# Patient Record
Sex: Male | Born: 1980 | Race: White | Hispanic: No | Marital: Married | Smoking: Current every day smoker
Health system: Southern US, Community
[De-identification: ages and names within clinical notes are randomized; demographics above are authoritative.]

## PROBLEM LIST (undated history)

## (undated) DIAGNOSIS — M549 Dorsalgia, unspecified: Secondary | ICD-10-CM

## (undated) DIAGNOSIS — R569 Unspecified convulsions: Secondary | ICD-10-CM

## (undated) DIAGNOSIS — G8929 Other chronic pain: Secondary | ICD-10-CM

## (undated) HISTORY — PX: CHOLECYSTECTOMY: SHX55

---

## 2002-06-26 ENCOUNTER — Emergency Department (HOSPITAL_COMMUNITY): Admission: EM | Admit: 2002-06-26 | Discharge: 2002-06-26 | Payer: Self-pay | Admitting: *Deleted

## 2003-01-14 ENCOUNTER — Emergency Department (HOSPITAL_COMMUNITY): Admission: AD | Admit: 2003-01-14 | Discharge: 2003-01-15 | Payer: Self-pay | Admitting: Emergency Medicine

## 2003-11-11 ENCOUNTER — Emergency Department (HOSPITAL_COMMUNITY): Admission: EM | Admit: 2003-11-11 | Discharge: 2003-11-11 | Payer: Self-pay | Admitting: Emergency Medicine

## 2004-08-19 ENCOUNTER — Emergency Department (HOSPITAL_COMMUNITY): Admission: EM | Admit: 2004-08-19 | Discharge: 2004-08-19 | Payer: Self-pay | Admitting: Emergency Medicine

## 2004-10-08 ENCOUNTER — Emergency Department (HOSPITAL_COMMUNITY): Admission: EM | Admit: 2004-10-08 | Discharge: 2004-10-09 | Payer: Self-pay | Admitting: Emergency Medicine

## 2004-11-05 ENCOUNTER — Emergency Department (HOSPITAL_COMMUNITY): Admission: EM | Admit: 2004-11-05 | Discharge: 2004-11-05 | Payer: Self-pay | Admitting: Emergency Medicine

## 2004-12-31 ENCOUNTER — Emergency Department (HOSPITAL_COMMUNITY): Admission: EM | Admit: 2004-12-31 | Discharge: 2004-12-31 | Payer: Self-pay | Admitting: Emergency Medicine

## 2005-02-25 ENCOUNTER — Emergency Department (HOSPITAL_COMMUNITY): Admission: EM | Admit: 2005-02-25 | Discharge: 2005-02-25 | Payer: Self-pay | Admitting: Family Medicine

## 2005-05-07 ENCOUNTER — Emergency Department (HOSPITAL_COMMUNITY): Admission: EM | Admit: 2005-05-07 | Discharge: 2005-05-07 | Payer: Self-pay | Admitting: Family Medicine

## 2005-12-07 ENCOUNTER — Emergency Department (HOSPITAL_COMMUNITY): Admission: EM | Admit: 2005-12-07 | Discharge: 2005-12-07 | Payer: Self-pay | Admitting: Emergency Medicine

## 2007-05-23 ENCOUNTER — Emergency Department (HOSPITAL_COMMUNITY): Admission: EM | Admit: 2007-05-23 | Discharge: 2007-05-23 | Payer: Self-pay | Admitting: Family Medicine

## 2008-09-05 ENCOUNTER — Emergency Department (HOSPITAL_COMMUNITY): Admission: EM | Admit: 2008-09-05 | Discharge: 2008-09-05 | Payer: Self-pay | Admitting: Emergency Medicine

## 2008-12-03 ENCOUNTER — Emergency Department (HOSPITAL_COMMUNITY): Admission: EM | Admit: 2008-12-03 | Discharge: 2008-12-03 | Payer: Self-pay | Admitting: Emergency Medicine

## 2009-11-27 ENCOUNTER — Emergency Department (HOSPITAL_COMMUNITY): Admission: EM | Admit: 2009-11-27 | Discharge: 2009-11-27 | Payer: Self-pay | Admitting: Emergency Medicine

## 2010-05-26 ENCOUNTER — Emergency Department (HOSPITAL_COMMUNITY): Payer: Self-pay

## 2010-05-26 ENCOUNTER — Emergency Department (HOSPITAL_COMMUNITY)
Admission: EM | Admit: 2010-05-26 | Discharge: 2010-05-26 | Disposition: A | Discharge status: Home / Self Care | Payer: Self-pay | Attending: Emergency Medicine | Admitting: Emergency Medicine

## 2010-05-26 DIAGNOSIS — F17200 Nicotine dependence, unspecified, uncomplicated: Secondary | ICD-10-CM | POA: Insufficient documentation

## 2010-05-26 DIAGNOSIS — R21 Rash and other nonspecific skin eruption: Secondary | ICD-10-CM | POA: Insufficient documentation

## 2010-05-26 DIAGNOSIS — M549 Dorsalgia, unspecified: Secondary | ICD-10-CM | POA: Insufficient documentation

## 2010-06-09 ENCOUNTER — Emergency Department (HOSPITAL_COMMUNITY)
Admission: EM | Admit: 2010-06-09 | Discharge: 2010-06-09 | Disposition: A | Discharge status: Home / Self Care | Payer: Self-pay | Attending: Emergency Medicine | Admitting: Emergency Medicine

## 2010-06-09 DIAGNOSIS — L03818 Cellulitis of other sites: Secondary | ICD-10-CM | POA: Insufficient documentation

## 2010-06-09 DIAGNOSIS — L02818 Cutaneous abscess of other sites: Secondary | ICD-10-CM | POA: Insufficient documentation

## 2010-06-09 DIAGNOSIS — F17200 Nicotine dependence, unspecified, uncomplicated: Secondary | ICD-10-CM | POA: Insufficient documentation

## 2010-06-12 ENCOUNTER — Emergency Department (HOSPITAL_COMMUNITY)
Admission: EM | Admit: 2010-06-12 | Discharge: 2010-06-12 | Disposition: A | Discharge status: Home / Self Care | Payer: Self-pay | Attending: Emergency Medicine | Admitting: Emergency Medicine

## 2010-06-12 DIAGNOSIS — L0211 Cutaneous abscess of neck: Secondary | ICD-10-CM | POA: Insufficient documentation

## 2010-07-12 LAB — DIFFERENTIAL
Basophils Absolute: 0 10*3/uL (ref 0.0–0.1)
Basophils Absolute: 0 10*3/uL (ref 0.0–0.1)
Basophils Relative: 0 % (ref 0–1)
Basophils Relative: 0 % (ref 0–1)
Eosinophils Absolute: 0 10*3/uL (ref 0.0–0.7)
Eosinophils Absolute: 0 10*3/uL (ref 0.0–0.7)
Eosinophils Relative: 0 % (ref 0–5)
Eosinophils Relative: 0 % (ref 0–5)
Lymphocytes Relative: 18 % (ref 12–46)
Lymphocytes Relative: 18 % (ref 12–46)
Lymphs Abs: 2.3 10*3/uL (ref 0.7–4.0)
Lymphs Abs: 2.3 10*3/uL (ref 0.7–4.0)
Monocytes Absolute: 0.9 10*3/uL (ref 0.1–1.0)
Monocytes Absolute: 1 10*3/uL (ref 0.1–1.0)
Monocytes Relative: 7 % (ref 3–12)
Monocytes Relative: 8 % (ref 3–12)
Neutro Abs: 9.2 10*3/uL — ABNORMAL HIGH (ref 1.7–7.7)
Neutro Abs: 9.5 10*3/uL — ABNORMAL HIGH (ref 1.7–7.7)
Neutrophils Relative %: 73 % (ref 43–77)
Neutrophils Relative %: 74 % (ref 43–77)

## 2010-07-12 LAB — URINALYSIS, ROUTINE W REFLEX MICROSCOPIC
Bilirubin Urine: NEGATIVE
Glucose, UA: NEGATIVE mg/dL
Hgb urine dipstick: NEGATIVE
Ketones, ur: NEGATIVE mg/dL
Nitrite: NEGATIVE
Protein, ur: NEGATIVE mg/dL
Specific Gravity, Urine: 1.027 (ref 1.005–1.030)
Urobilinogen, UA: 0.2 mg/dL (ref 0.0–1.0)
pH: 6 (ref 5.0–8.0)

## 2010-07-12 LAB — CBC
HCT: 47.9 % (ref 39.0–52.0)
HCT: 48.8 % (ref 39.0–52.0)
Hemoglobin: 16.5 g/dL (ref 13.0–17.0)
Hemoglobin: 16.9 g/dL (ref 13.0–17.0)
MCHC: 34.5 g/dL (ref 30.0–36.0)
MCHC: 34.7 g/dL (ref 30.0–36.0)
MCV: 95.1 fL (ref 78.0–100.0)
MCV: 96.3 fL (ref 78.0–100.0)
Platelets: 210 10*3/uL (ref 150–400)
Platelets: 215 10*3/uL (ref 150–400)
RBC: 4.98 MIL/uL (ref 4.22–5.81)
RBC: 5.14 MIL/uL (ref 4.22–5.81)
RDW: 12.6 % (ref 11.5–15.5)
RDW: 12.6 % (ref 11.5–15.5)
WBC: 12.5 10*3/uL — ABNORMAL HIGH (ref 4.0–10.5)
WBC: 12.9 10*3/uL — ABNORMAL HIGH (ref 4.0–10.5)

## 2010-07-12 LAB — POCT I-STAT, CHEM 8
BUN: 8 mg/dL (ref 6–23)
Calcium, Ion: 1.17 mmol/L (ref 1.12–1.32)
Chloride: 101 mEq/L (ref 96–112)
Creatinine, Ser: 1 mg/dL (ref 0.4–1.5)
Glucose, Bld: 95 mg/dL (ref 70–99)
HCT: 52 % (ref 39.0–52.0)
Hemoglobin: 17.7 g/dL — ABNORMAL HIGH (ref 13.0–17.0)
Potassium: 4.2 mEq/L (ref 3.5–5.1)
Sodium: 140 mEq/L (ref 135–145)
TCO2: 30 mmol/L (ref 0–100)

## 2010-07-12 LAB — COMPREHENSIVE METABOLIC PANEL
ALT: 16 U/L (ref 0–53)
ALT: 17 U/L (ref 0–53)
AST: 19 U/L (ref 0–37)
AST: 23 U/L (ref 0–37)
Albumin: 4.1 g/dL (ref 3.5–5.2)
Albumin: 4.3 g/dL (ref 3.5–5.2)
Alkaline Phosphatase: 60 U/L (ref 39–117)
Alkaline Phosphatase: 62 U/L (ref 39–117)
BUN: 6 mg/dL (ref 6–23)
BUN: 6 mg/dL (ref 6–23)
CO2: 29 mEq/L (ref 19–32)
CO2: 29 mEq/L (ref 19–32)
Calcium: 9.8 mg/dL (ref 8.4–10.5)
Calcium: 9.8 mg/dL (ref 8.4–10.5)
Chloride: 104 mEq/L (ref 96–112)
Chloride: 104 mEq/L (ref 96–112)
Creatinine, Ser: 0.88 mg/dL (ref 0.4–1.5)
Creatinine, Ser: 0.91 mg/dL (ref 0.4–1.5)
GFR calc Af Amer: >60 mL/min (ref >60)
GFR calc Af Amer: >60 mL/min (ref >60)
GFR calc non Af Amer: >60 mL/min (ref >60)
GFR calc non Af Amer: >60 mL/min (ref >60)
Glucose, Bld: 103 mg/dL — ABNORMAL HIGH (ref 70–99)
Glucose, Bld: 94 mg/dL (ref 70–99)
Potassium: 3.6 mEq/L (ref 3.5–5.1)
Potassium: 4.1 mEq/L (ref 3.5–5.1)
Sodium: 141 mEq/L (ref 135–145)
Sodium: 142 mEq/L (ref 135–145)
Total Bilirubin: 0.7 mg/dL (ref 0.3–1.2)
Total Bilirubin: 1.2 mg/dL (ref 0.3–1.2)
Total Protein: 7.5 g/dL (ref 6.0–8.3)
Total Protein: 7.9 g/dL (ref 6.0–8.3)

## 2010-07-12 LAB — RAPID URINE DRUG SCREEN, HOSP PERFORMED
Amphetamines: NOT DETECTED
Barbiturates: NOT DETECTED
Benzodiazepines: NOT DETECTED
Cocaine: NOT DETECTED
Opiates: NOT DETECTED
Tetrahydrocannabinol: POSITIVE — AB

## 2010-07-12 LAB — D-DIMER, QUANTITATIVE: D-Dimer, Quant: 0.22 ug/mL-FEU (ref 0.00–0.48)

## 2010-07-12 LAB — PROTIME-INR
INR: 1.1 (ref 0.00–1.49)
Prothrombin Time: 13.8 seconds (ref 11.6–15.2)

## 2010-07-12 LAB — ETHANOL: Alcohol, Ethyl (B): <5 mg/dL (ref 0–10)

## 2010-07-12 LAB — GLUCOSE, CAPILLARY: Glucose-Capillary: 99 mg/dL (ref 70–99)

## 2010-11-02 ENCOUNTER — Emergency Department (HOSPITAL_COMMUNITY)
Admission: EM | Admit: 2010-11-02 | Discharge: 2010-11-03 | Disposition: A | Discharge status: Home / Self Care | Payer: Medicaid Other | Attending: Emergency Medicine | Admitting: Emergency Medicine

## 2010-11-02 DIAGNOSIS — K2970 Gastritis, unspecified, without bleeding: Secondary | ICD-10-CM | POA: Insufficient documentation

## 2010-11-02 DIAGNOSIS — R1013 Epigastric pain: Secondary | ICD-10-CM | POA: Insufficient documentation

## 2010-11-02 DIAGNOSIS — K2990 Gastroduodenitis, unspecified, without bleeding: Secondary | ICD-10-CM | POA: Insufficient documentation

## 2010-11-02 LAB — CBC
HCT: 43.7 % (ref 39.0–52.0)
Hemoglobin: 15.1 g/dL (ref 13.0–17.0)
MCH: 30.8 pg (ref 26.0–34.0)
MCHC: 34.6 g/dL (ref 30.0–36.0)
MCV: 89.2 fL (ref 78.0–100.0)

## 2010-11-02 LAB — URINALYSIS, ROUTINE W REFLEX MICROSCOPIC
Hgb urine dipstick: NEGATIVE
Specific Gravity, Urine: 1.038 — ABNORMAL HIGH (ref 1.005–1.030)
Urobilinogen, UA: 1 mg/dL (ref 0.0–1.0)

## 2010-11-02 LAB — COMPREHENSIVE METABOLIC PANEL
BUN: 11 mg/dL (ref 6–23)
Calcium: 9.8 mg/dL (ref 8.4–10.5)
Creatinine, Ser: 0.77 mg/dL (ref 0.50–1.35)
GFR calc Af Amer: >60 mL/min (ref >60)
Glucose, Bld: 96 mg/dL (ref 70–99)
Sodium: 138 mEq/L (ref 135–145)
Total Protein: 8 g/dL (ref 6.0–8.3)

## 2010-11-02 LAB — DIFFERENTIAL
Basophils Relative: 0 % (ref 0–1)
Monocytes Absolute: 1.3 10*3/uL — ABNORMAL HIGH (ref 0.1–1.0)
Monocytes Relative: 10 % (ref 3–12)
Neutro Abs: 8.4 10*3/uL — ABNORMAL HIGH (ref 1.7–7.7)

## 2010-11-02 LAB — LIPASE, BLOOD: Lipase: 22 U/L (ref 11–59)

## 2010-11-08 ENCOUNTER — Emergency Department (HOSPITAL_COMMUNITY): Payer: Medicaid Other

## 2010-11-08 ENCOUNTER — Emergency Department (HOSPITAL_BASED_OUTPATIENT_CLINIC_OR_DEPARTMENT_OTHER): Payer: Self-pay

## 2010-11-08 ENCOUNTER — Inpatient Hospital Stay (HOSPITAL_COMMUNITY)
Admission: EM | Admit: 2010-11-08 | Discharge: 2010-11-10 | DRG: 419 | Disposition: A | Discharge status: Home / Self Care | Payer: Medicaid Other | Attending: General Surgery | Admitting: General Surgery

## 2010-11-08 DIAGNOSIS — K8010 Calculus of gallbladder with chronic cholecystitis without obstruction: Secondary | ICD-10-CM | POA: Diagnosis present

## 2010-11-08 DIAGNOSIS — R1011 Right upper quadrant pain: Secondary | ICD-10-CM

## 2010-11-08 DIAGNOSIS — R112 Nausea with vomiting, unspecified: Secondary | ICD-10-CM | POA: Diagnosis present

## 2010-11-08 DIAGNOSIS — F17200 Nicotine dependence, unspecified, uncomplicated: Secondary | ICD-10-CM | POA: Diagnosis present

## 2010-11-08 DIAGNOSIS — R197 Diarrhea, unspecified: Secondary | ICD-10-CM

## 2010-11-08 DIAGNOSIS — E669 Obesity, unspecified: Secondary | ICD-10-CM | POA: Diagnosis present

## 2010-11-08 DIAGNOSIS — G8929 Other chronic pain: Secondary | ICD-10-CM | POA: Diagnosis present

## 2010-11-08 DIAGNOSIS — K8000 Calculus of gallbladder with acute cholecystitis without obstruction: Principal | ICD-10-CM | POA: Diagnosis present

## 2010-11-08 DIAGNOSIS — M549 Dorsalgia, unspecified: Secondary | ICD-10-CM | POA: Diagnosis present

## 2010-11-08 LAB — COMPREHENSIVE METABOLIC PANEL
ALT: 17 U/L (ref 0–53)
Albumin: 3.5 g/dL (ref 3.5–5.2)
Alkaline Phosphatase: 69 U/L (ref 39–117)
BUN: 10 mg/dL (ref 6–23)
Chloride: 101 mEq/L (ref 96–112)
Glucose, Bld: 109 mg/dL — ABNORMAL HIGH (ref 70–99)
Potassium: 3.4 mEq/L — ABNORMAL LOW (ref 3.5–5.1)
Sodium: 140 mEq/L (ref 135–145)
Total Bilirubin: 0.3 mg/dL (ref 0.3–1.2)

## 2010-11-08 LAB — CBC
HCT: 40.6 % (ref 39.0–52.0)
Hemoglobin: 13.6 g/dL (ref 13.0–17.0)
RDW: 11.7 % (ref 11.5–15.5)
WBC: 13.2 10*3/uL — ABNORMAL HIGH (ref 4.0–10.5)

## 2010-11-08 LAB — DIFFERENTIAL
Basophils Absolute: 0 10*3/uL (ref 0.0–0.1)
Basophils Relative: 0 % (ref 0–1)
Lymphocytes Relative: 17 % (ref 12–46)
Neutro Abs: 9.4 10*3/uL — ABNORMAL HIGH (ref 1.7–7.7)
Neutrophils Relative %: 71 % (ref 43–77)

## 2010-11-08 LAB — LIPASE, BLOOD: Lipase: 34 U/L (ref 11–59)

## 2010-11-09 ENCOUNTER — Other Ambulatory Visit (INDEPENDENT_AMBULATORY_CARE_PROVIDER_SITE_OTHER): Payer: Self-pay | Admitting: Surgery

## 2010-11-09 ENCOUNTER — Inpatient Hospital Stay (HOSPITAL_COMMUNITY): Payer: Medicaid Other

## 2010-11-09 DIAGNOSIS — K8000 Calculus of gallbladder with acute cholecystitis without obstruction: Secondary | ICD-10-CM

## 2010-11-10 NOTE — H&P (Signed)
  NAMEEVANDER, MACARAEG NO.:  [id number]  MEDICAL RECORD NO.:  [id number]  LOCATION:  WLED                         FACILITY:  [facility]  PHYSICIAN:  Korie Brabson. Corliss Skains, M.D. DATE OF BIRTH:  1981-02-25  DATE OF ADMISSION:  11/08/2010 DATE OF DISCHARGE:                             HISTORY & PHYSICAL   CHIEF COMPLAINT:  Right upper quadrant pain, nausea, vomiting, and diarrhea.  HISTORY OF PRESENT ILLNESS:  This is a 30 year old male in previously good health, presents with a two-week history of intermittent severe right upper quadrant abdominal pain.  This does occur after eating. There are not any types of food that seemed to bother him more than others.  The patient has chronic constipation, but over the last couple of weeks, has had a lot of diarrhea.  Yesterday evening, he had a bowl Rahman noodles, and shortly thereafter, had severe right upper quadrant abdominal pain, nausea, and vomiting.  He has also had some diarrhea today.  He denies any fever.  He presented to the Emergency Department for evaluation.  PAST MEDICAL HISTORY:  Migraine headaches and chronic lumbar back pain.  PAST SURGICAL HISTORY:  None.  FAMILY HISTORY:  The patient has several relatives that has had the gallbladder was removed.  His father had lung cancer and skin cancer. Mother with breast cancer.  SOCIAL HISTORY:  The patient smokes about half a pack a day.  Drinks beer rarely.  He does admit to recent marijuana use to try to help him with his nausea.  REVIEW OF SYSTEMS:  Chronic constipation, chronic low back pain, otherwise negative.  MEDICATIONS:  None.  ALLERGIES:  None.  PHYSICAL EXAMINATION:  VITAL SIGNS:  Temperature 99.3, heart rate 95, blood pressure 148/89, respiratory rate 22, sats 99% on room air. GENERAL:  Well-developed and well-nourished male in no apparent distress. HEENT:  EOMI.  Sclerae anicteric. NECK:  No mass.  No thyromegaly. LUNGS:  Clear.  Normal  respiratory effort. HEART:  Regular rate and rhythm.  No murmur. ABDOMEN:  Positive bowel sounds, soft, tender in the right upper quadrant.  No epigastric tenderness.  No palpable masses.  No sign of umbilical hernia. SKIN:  Warm and dry with no sign of jaundice.  LABS:  White count 13.2, hemoglobin 13.6, and platelet count 253. Electrolytes are within normal limits.  Total bilirubin 0.3, AST 17, ALT 18, alk phos 69, and lipase 34.  Ultrasound showed a 1.1-cm non mobile gallstone lodged in the gallbladder neck.  There is some slight wall thickening.  Positive sonographic Murphy sign.  The common bile duct is mildly prominent, but no stones are noted.  IMPRESSION:  Acute cholecystitis.  PLAN:  We will admit the patient for IV antibiotics, bowel rest, and plan for laparoscopic cholecystectomy tomorrow or later today.     Wilmon Arms. Corliss Skains, M.D.     MKT/MEDQ  D:  11/09/2010  T:  11/09/2010  Job:  [id number]  Electronically Signed by Manus Rudd M.D. on 11/10/2010 06:50:57 PM

## 2010-11-11 NOTE — Op Note (Signed)
Brandon Blake, Brandon Blake NO.:  [id number]  MEDICAL RECORD NO.:  [id number]  LOCATION:  1526                         FACILITY:  [facility]  PHYSICIAN:  Sandria Bales. Ezzard Standing, M.D.  DATE OF BIRTH:  [birth year]  DATE OF PROCEDURE:  11/09/2010                              OPERATIVE REPORT   PREOPERATIVE DIAGNOSES:  Cholelithiasis, cholecystitis.  POSTOPERATIVE DIAGNOSIS:  Chronic and acute cholecystitis with cholelithiasis.  PROCEDURE:  Laparoscopic cholecystectomy with intraoperative cholangiogram.  SURGEON:  Sandria Bales. Ezzard Standing, MD  FIRST ASSISTANT:  Anselm Pancoast. Weatherly, MD  ANESTHESIA:  General endotracheal with approximately 20 cc of 0.25% Marcaine with epinephrine.  COMPLICATIONS:  None.  INDICATIONS FOR PROCEDURE:  Brandon Blake is a 30 year old white male who presents with acute cholecystitis adn was admitted by Dr. Manus Rudd last evening.  I discussed with him about proceeding with gallbladder surgery.  I discussed the indications, potential complications of surgery.  The potential complications of gallbladder surgery include, but are not limited to, bleeding, infection, common duct injury, and open surgery.  OPERATIVE NOTE:  The patient was placed in a supine position, underwent general endotracheal anesthesia in room #1, supervised by Dr. Eilene Ghazi.  The time-out was held and a surgical checklist run.  His abdomen was prepped with ChloraPrep and sterilely draped.  It was noted he had a rash that involved his groin.  There was superficial and predated his hospitalization.  After sterilely draping the abdomen, he was already on Unasyn antibiotic.  I made an infraumbilical incision.  Sharp dissection carried down into the abdominal cavity.  A 0 degree 10 mm laparoscope was placed through a 12 mm Hasson trocar.  The Hasson trocar was secured with 0 Vicryl suture.  I placed a 10 mm right subxiphoid trocar, a 5 mm right mid subcostal trocar, and a 5 mm  lateral subcostal trocar.  The gallbladder was noted to be covered in omentum.  I did an exploration that revealed the stomach was unremarkable.  The liver that I could see was unremarkable.  The bowel that I could see was unremarkable.    I grabbed the gallbladder and dissected omentum off the gallbladder.  The gallbladder was acutely inflamed, but also was chronic thickened wall, consistent with chronic cholecystitis.  I got down to the gallbladder cystic duct junction, I isolated the cystic duct, placed a clip on the gallbladder side of the cystic duct, and shot intraoperative cholangiogram.  The internal cholangiogram was shot using cutoff Taut catheter, inserted through a 14-gauge Jelco.  The Taut catheter was then secured into the cystic duct with a clip.  Intraoperative cholangiogram showed contrast flowing freely down the cystic duct into the common bile duct up the hepatic radicals and into the duodenum.  There was no filling defect, no mass, and this was felt to be a normal intraoperative cholangiogram.  The Taut catheter was then removed from the cystic duct.  I then triply Endo clipped and divided.  The identified at least 2 branches of the cystic artery to the gallbladder which were clipped, the gallbladder was then bluntly and sharply dissected from the gallbladder bed.  At the very dome of the gallbladder, this thing was really intrahepatic.  We went in about 3 cm within the liver bed and getting this out I took some liver with it.  Hemostasis was controlled with Bovie electrocautery.  I also put some Surgicel to the upper part for this gallbladder bed intrahepatic.  The gallbladder was placed in an EndoCatch bag and delivered through the umbilicus.  I irrigated the abdomen with about 2.5 L of saline.  The umbilical port was closed with 0 Vicryl suture.  Each trocar was removed under direct visualization.  The skin at each port was closed with 5-0 Vicryl suture and  painted with Dermabond.    The patient tolerated the procedure well, was transported to the recovery room in good condition. Sponge and needle count were correct at the end of the case.   Sandria Bales. Ezzard Standing, M.D., FACS    DHN/MEDQ  D:  11/09/2010  T:  11/10/2010  Job:  [id number]  Electronically Signed by Ovidio Kin M.D. on 11/11/2010 09:52:52 AM

## 2010-11-14 ENCOUNTER — Other Ambulatory Visit (HOSPITAL_COMMUNITY): Payer: Self-pay

## 2010-11-14 ENCOUNTER — Inpatient Hospital Stay (HOSPITAL_COMMUNITY): Admission: RE | Admit: 2010-11-14 | Payer: Self-pay | Source: Ambulatory Visit

## 2010-11-15 ENCOUNTER — Other Ambulatory Visit (INDEPENDENT_AMBULATORY_CARE_PROVIDER_SITE_OTHER): Payer: Self-pay | Admitting: General Surgery

## 2010-11-15 NOTE — Progress Notes (Signed)
Patient called requesting refill of percocet.  He had lap chole by Dr. Ezzard Standing last Sunday and is scheduled to follow up in the [facility] clinic.  He says that he just took his last pain pill and his umbilical wound has opened up after a difficult bowel movement and has become more sore.  He states that the rest of his abdomen is okay.  Denies redness, purulence, or fevers and is taking good po intake.  I called in a refill of Vicodin to [facility] pharmacy at [phone].  #40 given. No refills.

## 2010-11-18 ENCOUNTER — Ambulatory Visit (INDEPENDENT_AMBULATORY_CARE_PROVIDER_SITE_OTHER): Payer: Self-pay | Admitting: Radiology

## 2010-11-18 ENCOUNTER — Encounter (INDEPENDENT_AMBULATORY_CARE_PROVIDER_SITE_OTHER): Payer: Self-pay

## 2010-11-18 DIAGNOSIS — K810 Acute cholecystitis: Secondary | ICD-10-CM

## 2010-11-18 MED ORDER — OXYCODONE-ACETAMINOPHEN 5-325 MG PO TABS: 1.0000 | ORAL_TABLET | Freq: Four times a day (QID) | ORAL | Status: DC | PRN

## 2010-11-18 MED ORDER — DOXYCYCLINE HYCLATE 100 MG PO TABS: 100.0000 mg | ORAL_TABLET | Freq: Two times a day (BID) | ORAL | Status: AC

## 2010-11-18 NOTE — Progress Notes (Signed)
Brandon Blake is a 30 y.o. male who had a laparoscopic cholecystectomy with intraoperative cholangiogram.  The pathology report confirmed cholecystitis.  The patient reports that they are having normal bowel movements and good appetite. However, he's been having some pain at his umbilical incision and has noticed the skin edges separating and some fluid draining. He denies fever, chills. The pre-operative symptoms of abdominal pain, nausea, and vomiting have resolved.    Physical examination - Incisions are healing well except at the umbilicus. This incision has some erythema and the skin edges have separated a bit but i couldn't get any purulence from it, though he was pretty tender. The remainder of his abdominal exam was normal.  Impression:  s/p laparoscopic cholecystectomy.  Probable mild superficial wound infection  Plan:  He may resume a regular diet and full activity. I am going to prescribe him some Doxy 100mg  bid and give him a refill on his Percocet. He has another follow up scheduled for 8-21 which he should keep unless he is feeling markedly better.

## 2010-11-19 ENCOUNTER — Telehealth (INDEPENDENT_AMBULATORY_CARE_PROVIDER_SITE_OTHER): Payer: Self-pay | Admitting: General Surgery

## 2010-11-19 NOTE — Telephone Encounter (Signed)
He called and reported he was having more abdominal pain after cholecystectomy. This was done by Dr. Ovidio Kin. It was approximately 1 week ago. He has some redness in the periumbilical area and drainage of the incision. He has been cleaning it with peroxide. I told him to shower and cover with a dry bandage. I couldn't call the office in the morning and either be seen or we will start him on an antibiotic.

## 2010-11-25 ENCOUNTER — Encounter (INDEPENDENT_AMBULATORY_CARE_PROVIDER_SITE_OTHER): Payer: Self-pay | Admitting: General Surgery

## 2010-11-25 NOTE — Discharge Summary (Signed)
  Brandon Blake, Brandon Blake NO.:  [id number]  MEDICAL RECORD NO.:  [id number]  LOCATION:  1526                         FACILITY:  [facility]  PHYSICIAN:  Sandria Bales. Ezzard Standing, M.D.  DATE OF BIRTH:  [birth year]  DATE OF ADMISSION:  11/08/2010 DATE OF DISCHARGE:  11/10/2010                              DISCHARGE SUMMARY   ADMISSION DIAGNOSIS:  Acute cholecystitis.  DISCHARGE DIAGNOSIS:  Acute and chronic cholecystitis with cholelithiasis.  PROCEDURES:  Laparoscopic cholecystectomy November 09, 2010, with intraoperative cholangiogram.  BRIEF HISTORY:  The patient is a 30 year old male previously in good health with a 2-week history of intermittent right upper quadrant abdominal pain occurs after eating.  There are 90 types of food that bother him more than others.  He has chronic constipation over the last couple of weeks.  He has had a lot of diarrhea.  Yesterday, he had a bowl ramen noodles and developed severe right upper quadrant abdominal pain, nausea, and vomiting.    He presented to the emergency room for evaluation on November 08, 2010.  White count was 13,200, hemoglobin and hematocrit was stable.  LFTs were normal.  Ultrasound showed a 1.1 cm mobile gallstone lodged at the gallbladder neck.  There was some gallbladder wall thickening and positive Murphy's sonogram on ultrasound.  He was diagnosed with cholecystitis, placed on IV antibiotics, and is scheduled for laparoscopy the following morning. For further history and physical, please see the dictated note.  PAST MEDICAL HISTORY:  Migraines and chronic lumbar back pain.  MEDICATIONS ON ADMISSION:  None.  ALLERGIES:  None.  HOSPITAL COURSE:  The patient was admitted.  He was actually admitted in the a.m. of November 09, 2010, seen the following morning by Dr. Ezzard Standing and taken later that day to the OR for laparoscopic cholecystectomy.  He tolerated the procedure well.  Cholangiogram showed patent biliary tree without  common duct stone.    He was transferred to the floor postoperatively and was slowly mobilized.  He has had a fair amount of discomfort; this seems to be improving.  He was anxious for discharge home in the afternoon of November 10, 2010.    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  So, we will plan to recommend he stay off work for at least 2 weeks and increase his activity slowly.   He can shower.   He can remove the Steri-Strips in 5-7 days.   We have placed him on Percocet 7.5/325, 1-2 q.4 p.r.n. for pain.  He may also take plain Tylenol or ibuprofen for mild-to-moderate pain.  He will return to the [facility] on November 25, 2010, at 3:20 p.m.   He is to call for any fever, nausea, vomiting, increased abdominal pain, trouble voiding, or if he has any problems with his incision such as her pain, redness, or drainage.  CONDITION ON DISCHARGE:  Improving.   Eber Hong, P.A.   Sandria Bales. Ezzard Standing, M.D., FACS   WDJ/MEDQ  D:  11/10/2010  T:  11/11/2010  Job:  [id number] Electronically Signed by Sherrie George P.A. on 11/21/2010 10:59:41 PM Electronically Signed by Ovidio Kin M.D. on 11/25/2010 02:05:28 PM

## 2010-12-26 LAB — CULTURE, ROUTINE-ABSCESS: Gram Stain: NONE SEEN

## 2010-12-31 ENCOUNTER — Emergency Department (HOSPITAL_COMMUNITY)
Admission: EM | Admit: 2010-12-31 | Discharge: 2010-12-31 | Disposition: A | Discharge status: Home / Self Care | Payer: Medicaid Other | Attending: Emergency Medicine | Admitting: Emergency Medicine

## 2010-12-31 DIAGNOSIS — L02818 Cutaneous abscess of other sites: Secondary | ICD-10-CM | POA: Insufficient documentation

## 2010-12-31 DIAGNOSIS — L03818 Cellulitis of other sites: Secondary | ICD-10-CM | POA: Insufficient documentation

## 2011-03-09 ENCOUNTER — Emergency Department (HOSPITAL_COMMUNITY)
Admission: EM | Admit: 2011-03-09 | Discharge: 2011-03-09 | Discharge status: Against Medical Advice | Payer: Medicaid Other | Attending: Emergency Medicine | Admitting: Emergency Medicine

## 2011-03-09 DIAGNOSIS — M25569 Pain in unspecified knee: Secondary | ICD-10-CM | POA: Insufficient documentation

## 2011-05-06 ENCOUNTER — Other Ambulatory Visit: Payer: Self-pay

## 2011-05-06 ENCOUNTER — Emergency Department (HOSPITAL_COMMUNITY)
Admission: EM | Admit: 2011-05-06 | Discharge: 2011-05-07 | Disposition: A | Discharge status: Home / Self Care | Payer: Self-pay | Attending: Emergency Medicine | Admitting: Emergency Medicine

## 2011-05-06 ENCOUNTER — Emergency Department (HOSPITAL_COMMUNITY): Payer: Self-pay

## 2011-05-06 ENCOUNTER — Encounter (HOSPITAL_COMMUNITY): Payer: Self-pay | Admitting: *Deleted

## 2011-05-06 DIAGNOSIS — F17200 Nicotine dependence, unspecified, uncomplicated: Secondary | ICD-10-CM | POA: Insufficient documentation

## 2011-05-06 DIAGNOSIS — R071 Chest pain on breathing: Secondary | ICD-10-CM | POA: Insufficient documentation

## 2011-05-06 DIAGNOSIS — R0789 Other chest pain: Secondary | ICD-10-CM

## 2011-05-06 DIAGNOSIS — R079 Chest pain, unspecified: Secondary | ICD-10-CM | POA: Insufficient documentation

## 2011-05-06 HISTORY — DX: Other chronic pain: G89.29

## 2011-05-06 HISTORY — DX: Dorsalgia, unspecified: M54.9

## 2011-05-06 LAB — BASIC METABOLIC PANEL
Chloride: 105 mEq/L (ref 96–112)
GFR calc Af Amer: >90 mL/min (ref >90)
GFR calc non Af Amer: >90 mL/min (ref >90)
Potassium: 4 mEq/L (ref 3.5–5.1)
Sodium: 140 mEq/L (ref 135–145)

## 2011-05-06 LAB — DIFFERENTIAL
Basophils Absolute: 0 10*3/uL (ref 0.0–0.1)
Basophils Relative: 0 % (ref 0–1)
Eosinophils Absolute: 0.2 10*3/uL (ref 0.0–0.7)
Monocytes Relative: 12 % (ref 3–12)
Neutro Abs: 4.9 10*3/uL (ref 1.7–7.7)
Neutrophils Relative %: 51 % (ref 43–77)

## 2011-05-06 LAB — CBC
Hemoglobin: 14.2 g/dL (ref 13.0–17.0)
MCH: 31.1 pg (ref 26.0–34.0)
MCHC: 35 g/dL (ref 30.0–36.0)
Platelets: 214 10*3/uL (ref 150–400)
RDW: 12 % (ref 11.5–15.5)

## 2011-05-06 LAB — TROPONIN I: Troponin I: <0.3 ng/mL (ref <0.30)

## 2011-05-06 MED ORDER — ASPIRIN 81 MG PO CHEW
324.0000 mg | CHEWABLE_TABLET | Freq: Once | ORAL | Status: AC
  Administered 2011-05-06: 324 mg via ORAL
  Filled 2011-05-06: qty 1

## 2011-05-06 MED ORDER — KETOROLAC TROMETHAMINE 30 MG/ML IJ SOLN
30.0000 mg | Freq: Once | INTRAMUSCULAR | Status: AC
  Administered 2011-05-06: 30 mg via INTRAVENOUS
  Filled 2011-05-06: qty 1

## 2011-05-06 MED ORDER — MORPHINE SULFATE 4 MG/ML IJ SOLN
4.0000 mg | Freq: Once | INTRAMUSCULAR | Status: AC
  Administered 2011-05-06: 4 mg via INTRAVENOUS
  Filled 2011-05-06: qty 1

## 2011-05-06 NOTE — ED Provider Notes (Signed)
History     CSN: [id number]  Arrival date & time 05/06/11  4098   First MD Initiated Contact with Patient 05/06/11 1920      Chief Complaint  Patient presents with  . Chest Pain    (Consider location/radiation/quality/duration/timing/severity/associated sxs/prior treatment) Patient is a 31 y.o. male presenting with chest pain. The history is provided by the patient.  Chest Pain   He had onset at 4 PM today of severe, sharp anterior chest pain. Pain does not radiate. It is worse with moving his arms and worse with taking a deep breath and worse with twisting or turning. There is no associated dyspnea, nausea, vomiting, diaphoresis. Pain is severe and he rates it at 9/10. He has taken baby aspirin and acetaminophen with no relief. He has been having similar pains intermittently for several months and they're getting worse. He denies any trauma denies any unusual activity. This pain occurred while he was sitting holding his baby. Cardiac risk factors include tobacco use and a family history of premature cardiac disease. He smokes half-pack of cigarettes a day and thinks his mother had an MI in her 25s. He denies history of hypertension, diabetes, elevated cholesterol. Nothing has made this pain any better.  Past Medical History  Diagnosis Date  . Back pain, chronic     Past Surgical History  Procedure Date  . Cholecystectomy     Family History  Problem Relation Age of Onset  . Cancer Father     History  Substance Use Topics  . Smoking status: Current Everyday Smoker -- 3.0 packs/day  . Smokeless tobacco: Not on file  . Alcohol Use: No      Review of Systems  Cardiovascular: Positive for chest pain.  All other systems reviewed and are negative.    Allergies  Review of patient's allergies indicates no known allergies.  Home Medications  No current outpatient prescriptions on file.  BP 123/69  Pulse 72  Temp(Src) 98.6 F (37 C) (Oral)  Resp 16  SpO2  100%  Physical Exam  Nursing note and vitals reviewed.  31 year old male who appears uncomfortable. Vital signs are normal. Oxygen saturation is 100% which is normal. Head is normocephalic and atraumatic. PERRLA, EOMI. Oropharynx is clear. Neck is supple without adenopathy and is nontender. Back is nontender. Lungs are clear without rales, wheezes, or rhonchi. Heart has regular rate rhythm without murmur. There is moderate to severe bilateral anterior chest wall tenderness with no localized tenderness. Abdomen is soft, flat, nontender without masses or hepatosplenomegaly. Extremities have full range of motion of all joints, no cyanosis or edema. Skin is warm and moist without rash. Neurologic: Mental status is normal, cranial nerves are intact, there no focal motor or sensory neurologic: No abnormalities of mood or affect.  ED Course  Procedures (including critical care time)   Is negative for serious causes of pain. He clearly has chest wall pain and will be treated with naproxen and Percocet. ECG shows normal sinus rhythm with a rate of 76, no ectopy. Normal axis. Normal P wave. Normal QRS. Normal intervals. Normal ST and T waves. Impression: normal ECG. No old ECG available for comparison.  He got no relief from IV Toradol. Is given Dilaudid with temporarily for pain. Workup   1. Chest wall pain       MDM  Chest pain which appears to be musculoskeletal in origin. Chest x-ray, ECG, and laboratory workup will be obtained. Will be given a dose of Toradol to assess response.  Dione Booze, MD 05/07/11 [phone]

## 2011-05-06 NOTE — ED Notes (Signed)
Pt states he has been having chest pain for several months, started back today, worse with movement, more noted lying down, sternal area tender to touch

## 2011-05-06 NOTE — ED Notes (Signed)
Patient states that this same type pain has been going on for about 3 months. Has not seen PCP or Cardiologist.

## 2011-05-06 NOTE — ED Notes (Signed)
Avenir Lozinski contact person for pt [phone]

## 2011-05-06 NOTE — ED Notes (Signed)
Dr. Preston Fleeting notified patient still having pain.

## 2011-05-06 NOTE — ED Notes (Signed)
Returned from xray

## 2011-05-07 MED ORDER — NAPROXEN 500 MG PO TABS: 500.0000 mg | ORAL_TABLET | Freq: Two times a day (BID) | ORAL | Status: DC

## 2011-05-07 MED ORDER — OXYCODONE-ACETAMINOPHEN 5-325 MG PO TABS
1.0000 | ORAL_TABLET | Freq: Once | ORAL | Status: AC
  Administered 2011-05-07: 1 via ORAL
  Filled 2011-05-07: qty 1

## 2011-05-07 MED ORDER — OXYCODONE-ACETAMINOPHEN 5-325 MG PO TABS: 1.0000 | ORAL_TABLET | ORAL | Status: AC | PRN

## 2011-05-07 NOTE — ED Notes (Signed)
Patient stable upon discharge.  

## 2011-05-11 ENCOUNTER — Emergency Department (HOSPITAL_COMMUNITY)
Admission: EM | Admit: 2011-05-11 | Discharge: 2011-05-11 | Disposition: A | Discharge status: Home / Self Care | Payer: Self-pay | Attending: Emergency Medicine | Admitting: Emergency Medicine

## 2011-05-11 ENCOUNTER — Encounter (HOSPITAL_COMMUNITY): Payer: Self-pay | Admitting: Emergency Medicine

## 2011-05-11 ENCOUNTER — Other Ambulatory Visit (HOSPITAL_COMMUNITY): Payer: Self-pay | Admitting: Pharmacy Technician

## 2011-05-11 DIAGNOSIS — L02818 Cutaneous abscess of other sites: Secondary | ICD-10-CM | POA: Insufficient documentation

## 2011-05-11 DIAGNOSIS — L509 Urticaria, unspecified: Secondary | ICD-10-CM | POA: Insufficient documentation

## 2011-05-11 DIAGNOSIS — L03818 Cellulitis of other sites: Secondary | ICD-10-CM | POA: Insufficient documentation

## 2011-05-11 DIAGNOSIS — R21 Rash and other nonspecific skin eruption: Secondary | ICD-10-CM | POA: Insufficient documentation

## 2011-05-11 DIAGNOSIS — L0291 Cutaneous abscess, unspecified: Secondary | ICD-10-CM

## 2011-05-11 MED ORDER — LORAZEPAM 1 MG PO TABS
1.0000 mg | ORAL_TABLET | Freq: Once | ORAL | Status: AC
  Administered 2011-05-11: 1 mg via ORAL
  Filled 2011-05-11: qty 1

## 2011-05-11 MED ORDER — DIPHENHYDRAMINE HCL 25 MG PO CAPS
50.0000 mg | ORAL_CAPSULE | Freq: Once | ORAL | Status: AC
  Administered 2011-05-11: 50 mg via ORAL
  Filled 2011-05-11: qty 2

## 2011-05-11 MED ORDER — FAMOTIDINE 20 MG PO TABS
20.0000 mg | ORAL_TABLET | Freq: Once | ORAL | Status: AC
  Administered 2011-05-11: 20 mg via ORAL
  Filled 2011-05-11: qty 1

## 2011-05-11 MED ORDER — HYDROCODONE-ACETAMINOPHEN 5-325 MG PO TABS: 1.0000 | ORAL_TABLET | Freq: Four times a day (QID) | ORAL | Status: AC | PRN

## 2011-05-11 MED ORDER — LIDOCAINE HCL 1 % IJ SOLN
5.0000 mL | Freq: Once | INTRAMUSCULAR | Status: AC
  Administered 2011-05-11: 20 mL via INTRADERMAL
  Filled 2011-05-11: qty 20

## 2011-05-11 MED ORDER — OXYCODONE-ACETAMINOPHEN 5-325 MG PO TABS
1.0000 | ORAL_TABLET | Freq: Once | ORAL | Status: AC
  Administered 2011-05-11: 1 via ORAL
  Filled 2011-05-11: qty 1

## 2011-05-11 NOTE — ED Provider Notes (Signed)
Medical screening examination/treatment/procedure(s) were performed by non-physician practitioner and as supervising physician I was immediately available for consultation/collaboration.   Dayton Bailiff, MD 05/11/11 [phone]

## 2011-05-11 NOTE — ED Notes (Signed)
Father Ronold Hardgrove [phone]. Will be back around 11:30pm.

## 2011-05-11 NOTE — ED Provider Notes (Signed)
History     CSN: [id number]  Arrival date & time 05/11/11  0865   First MD Initiated Contact with Patient 05/11/11 2102      Chief Complaint  Patient presents with  . Neck Pain    (Consider location/radiation/quality/duration/timing/severity/associated sxs/prior treatment) HPI Comments: Patient presents emergency department with chief complaint of abscess on his scalp.  Patient states that he noticed the abscess 3 days ago and now he states its very painful causing neck pain.  Patient denies ear pain or hearing loss.  Patient states that the abscess was draining yesterday and is now scabbed over.  Patient denies fevers, night sweats, chills.  In addition patient states that he thinks he may have had an allergic reaction to medication started on last week.  Patient was started on naproxen for chest pain.  Patient has urticarial rash on forearms neck and back.  Patient denies a feeling of his throat closing, difficulty breathing, shortness of breath, wheezing, stridor, lip swelling or tongue swelling, or change in voice.  Patient is a 31 y.o. male presenting with neck pain. The history is provided by the patient.  Neck Pain     Past Medical History  Diagnosis Date  . Back pain, chronic     Past Surgical History  Procedure Date  . Cholecystectomy     Family History  Problem Relation Age of Onset  . Cancer Father   . Diabetes Other   . Coronary artery disease Other     History  Substance Use Topics  . Smoking status: Current Everyday Smoker -- 0.5 packs/day    Types: Cigarettes  . Smokeless tobacco: Not on file  . Alcohol Use: No      Review of Systems  HENT: Positive for neck pain.   All other systems reviewed and are negative.    Allergies  Naproxen  Home Medications   Current Outpatient Rx  Name Route Sig Dispense Refill  . NAPROXEN 500 MG PO TABS Oral Take 500 mg by mouth 2 (two) times daily with a meal.    . OXYCODONE-ACETAMINOPHEN 5-325 MG PO TABS Oral  Take 1 tablet by mouth every 4 (four) hours as needed for pain. 20 tablet 0    BP 127/73  Pulse 75  Temp(Src) 99 F (37.2 C) (Oral)  Resp 18  Ht 6' (1.829 m)  Wt 260 lb (117.935 kg)  BMI 35.26 kg/m2  SpO2 96%  Physical Exam  Nursing note and vitals reviewed. Constitutional: He is oriented to person, place, and time. He appears well-developed and well-nourished. He does not have a sickly appearance. He does not appear ill. No distress.  HENT:  Head: Normocephalic.    Mouth/Throat: Oropharynx is clear and moist and mucous membranes are normal.       4x4cm abscess, not currently draining, tender to palpation.   No sign of airway obstruction. No edema of face, eyelids, lips, tongue, uvula.Marland Kitchen Uvula midline, no nasal congestion or drooling.  Tongue not elevated. No trismus.  Eyes: Conjunctivae and EOM are normal.  Neck: Trachea normal, normal range of motion and full passive range of motion without pain. Neck supple. Carotid bruit is not present. No tracheal deviation present.       No carotid bruits or stridor  Cardiovascular: Normal rate, regular rhythm, intact distal pulses and normal pulses.        Not tachycardic  Pulmonary/Chest: Effort normal and breath sounds normal. No stridor.  Musculoskeletal: Normal range of motion. He exhibits no edema.  Lymphadenopathy:       Head (right side): No submental, no preauricular and no posterior auricular adenopathy present.       Head (left side): No submental, no submandibular, no preauricular and no posterior auricular adenopathy present.    He has no axillary adenopathy.  Neurological: He is alert and oriented to person, place, and time.  Skin: Skin is warm, dry and intact. Rash noted. Rash is urticarial. He is not diaphoretic.       Not diaphoretic. Raised erythematous welts, pruritic in nature, located anterior forearms, neck and back. Blanchable urticaria, no petechiae or purpura.   Psychiatric: He has a normal mood and affect. His  behavior is normal.    ED Course  Procedures (including critical care time)  Labs Reviewed - No data to display No results found.   No diagnosis found.  INCISION AND DRAINAGE Performed by: Jaci Carrel Consent: Verbal consent obtained. Risks and benefits: risks, benefits and alternatives were discussed Type: abscess  Body area: left scalp  Anesthesia: local infiltration  Local anesthetic: lidocaine 2% with epinephrine  Anesthetic total: 1 ml  Complexity: complex Blunt dissection to break up loculations  Drainage: purulent  Drainage amount: minimum   Packing material: none  Patient tolerance: Patient tolerated the procedure well with no immediate complications.     MDM  Abscess, question allergic reaction  Patient with skin abscess amenable to incision and drainage.  Abscess did not have large enough pocket after I &D to warrant packing. No signs of cellulitis is surrounding skin.   No antibiotic therapy is indicated. Patient re-evaluated prior to dc, is hemodynamically stable, in no respiratory distress, and denies the feeling of throat closing. Pt has been advised to take OTC benadryl & return to the ED if they have a mod-severe allergic rxn (s/s including throat closing, difficulty breathing, swelling of lips face or tongue). Pt is to follow up with their PCP. Pt is agreeable with plan & verbalizes understanding. Will d/c to home.          Jaci Carrel, [place] 05/11/11 2312

## 2011-05-11 NOTE — ED Notes (Signed)
Pt states he has a lump on the back of his neck and was started on naproxen and now he has broke out in a rash  PT states his neck is very painful  Pt has a raised area with a scab noted to the base of his skull on the left side  Pt states it is causing pain all the way down his neck

## 2011-05-11 NOTE — ED Notes (Signed)
Dressed patients wound

## 2011-06-04 ENCOUNTER — Emergency Department (HOSPITAL_COMMUNITY)
Admission: EM | Admit: 2011-06-04 | Discharge: 2011-06-04 | Disposition: A | Discharge status: Home / Self Care | Payer: Self-pay | Attending: Emergency Medicine | Admitting: Emergency Medicine

## 2011-06-04 ENCOUNTER — Encounter (HOSPITAL_COMMUNITY): Payer: Self-pay | Admitting: *Deleted

## 2011-06-04 DIAGNOSIS — R071 Chest pain on breathing: Secondary | ICD-10-CM | POA: Insufficient documentation

## 2011-06-04 DIAGNOSIS — G8929 Other chronic pain: Secondary | ICD-10-CM | POA: Insufficient documentation

## 2011-06-04 DIAGNOSIS — R0789 Other chest pain: Secondary | ICD-10-CM

## 2011-06-04 DIAGNOSIS — M549 Dorsalgia, unspecified: Secondary | ICD-10-CM | POA: Insufficient documentation

## 2011-06-04 DIAGNOSIS — F17200 Nicotine dependence, unspecified, uncomplicated: Secondary | ICD-10-CM | POA: Insufficient documentation

## 2011-06-04 MED ORDER — HYDROCODONE-ACETAMINOPHEN 5-325 MG PO TABS
1.0000 | ORAL_TABLET | Freq: Once | ORAL | Status: AC
  Administered 2011-06-04: 1 via ORAL
  Filled 2011-06-04: qty 1

## 2011-06-04 MED ORDER — DIAZEPAM 5 MG PO TABS
5.0000 mg | ORAL_TABLET | Freq: Once | ORAL | Status: AC
  Administered 2011-06-04: 5 mg via ORAL
  Filled 2011-06-04: qty 1

## 2011-06-04 MED ORDER — IBUPROFEN 800 MG PO TABS
800.0000 mg | ORAL_TABLET | Freq: Once | ORAL | Status: AC
  Administered 2011-06-04: 800 mg via ORAL
  Filled 2011-06-04: qty 1

## 2011-06-04 MED ORDER — IBUPROFEN 800 MG PO TABS: 800.0000 mg | ORAL_TABLET | Freq: Three times a day (TID) | ORAL | Status: AC | PRN

## 2011-06-04 MED ORDER — DIAZEPAM 5 MG PO TABS: 5.0000 mg | ORAL_TABLET | Freq: Three times a day (TID) | ORAL | Status: AC | PRN

## 2011-06-04 MED ORDER — KETOROLAC TROMETHAMINE 60 MG/2ML IM SOLN: 60.0000 mg | Freq: Once | INTRAMUSCULAR | Status: DC

## 2011-06-04 NOTE — ED Notes (Signed)
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

## 2011-06-04 NOTE — ED Notes (Signed)
Per EMS- pt in c/o chest wall pain that started after work, pain increased with movement and tender to palpation, localized to sternal area

## 2011-06-04 NOTE — Discharge Instructions (Signed)
Please review the instructions below. We have treated using [place] or persistent chest wall pain. We are treating this chest wall pain with ibuprofen and muscle relaxants. You will take the ibuprofen 3 times a day for a week and the muscle relaxers as directed when needed. If you're chest wall pain persists you will need to arrange follow up with her primary care physician. We have provided the "[facility]" number above, the resource list below and attached referral to assist you in getting established with a primary care physician.  Chest Wall Pain Chest wall pain is pain in or around the bones and muscles of your chest. This may occur:   On its own (spontaneously).   After a viral illness such as the flu.   Through injur.   From coughing.   Minor exercise.  It may take up to 6 weeks to get better; longer if you must stay physically active in your work and activities. HOME CARE INSTRUCTIONS   Avoid over-tiring physical activity. Try not to strain or perform activities which cause pain. This would include any activities using chest, belly (abdominal) and side muscles, especially if heavy weights are used.   Use ice on the painful area for 15 to 20 minutes per hour while awake for the first 2 days. Place the ice in a plastic bag and place a towel between the bag of ice and your skin.   Only take over-the-counter or prescription medicines for pain, discomfort, or fever as directed by your caregiver.  SEEK IMMEDIATE MEDICAL CARE IF:   Your pain increases or you are very uncomfortable.   An oral temperature above 102 F (38.9 C)develops.   Your chest pains become worse.   You develop new, unexplained problems (symptoms).   You develop nausea, vomiting, sweating or feel light headed.   You develop a cough which produces phlegm (sputum) or you cough up blood.  MAKE SURE YOU:   Understand these instructions.   Will watch your condition.   Will get help right away if you are not  doing well or get worse.  Document Released: 03/23/2005 Document Revised: 10/06/2010 Document Reviewed: 11/09/2007 [facility] Patient Information 2012 [place], [place].  RESOURCE GUIDE  Dental Problems  Patients with Medicaid: [facility] [phone] [address]                                           [phone] [address] Phone:  [phone]                                                  Phone:  [phone]  If unable to pay or uninsured, contact:  [facility] or [facility]. to become qualified for the adult dental clinic.  Chronic Pain Problems Contact [facility]  [phone] Patients need to be referred by their primary care doctor.  Insufficient Money for Medicine Contact [employer]:  call "211" or [facility] 7170312409.  No Primary Care Doctor Call [facility]  8283468273 Other agencies that provide inexpensive medical care    [facility]  [phone]    [facility]  [phone]    [facility]  [phone]    [facility]  [phone]    [facility]  [phone]    [facility]  [phone]  Psychological Services [facility]  [phone] [facility]  [phone] [facility]   [phone] (emergency services [phone])  Substance Abuse Resources [facility]  ([phone] [facility] 502-705-3112 [facility] [phone] Floydene Flock [phone] Residential & Outpatient Substance Abuse Program  [phone]  Abuse/Neglect [employer] Child Abuse Hotline 9408628579 [facility] Child Abuse Hotline 9124063309 (After Hours)  Emergency Shelter [facility] [phone])[phone]  Maternity Homes [facility] [place] [facility] [place] [phone] [facility] ([phone]  MRSA Hotline #:   (920)385-3654    [facility]  Resources  [facility] [facility]     [employer]                          [facility] Dept. [address] [place]                       [address]      [address]  Blondell Reveal Phone:  [phone]                                   Phone:  [phone]                 Phone:  [phone]  [facility] Phone:  [phone]  [facility] Child Abuse Hotline 3397880549 4380929240 (After Hours)

## 2011-06-04 NOTE — ED Provider Notes (Signed)
History     CSN: [id number]  Arrival date & time 06/04/11  0032   First MD Initiated Contact with Patient 06/04/11 0300      Chief Complaint  Patient presents with  . Muscle Pain     Patient is a 31 y.o. male presenting with musculoskeletal pain. The history is provided by the patient.  Muscle Pain This is a recurrent problem. The current episode started yesterday. The problem occurs constantly. The problem has been gradually worsening. Associated symptoms include chest pain. Pertinent negatives include no abdominal pain, chills, congestion, coughing, fatigue, fever, nausea, neck pain, vomiting or weakness.  Patient reports persistent chest wall pain that worsened yesterday. States he was seen January 31 2 in the emergency department for the same complaint and was placed on Naprosyn. States he had an allergic reaction to Naprosyn he came back in for allergic reaction was taken off the Naprosyn was not given medication for his chest wall pain. Patient denies any nausea, fever, cough, vomiting or any recent heavy lifting or strenuous exercise. States that diagnosis was an inflammation of his chest wall muscles. States pain has been intermittent but persistent and worsened yesterday. Pt states the pain is to his (R) chest  is worse w/ any type of movement.   Past Medical History  Diagnosis Date  . Back pain, chronic     Past Surgical History  Procedure Date  . Cholecystectomy     Family History  Problem Relation Age of Onset  . Cancer Father   . Diabetes Other   . Coronary artery disease Other     History  Substance Use Topics  . Smoking status: Current Everyday Smoker -- 0.5 packs/day    Types: Cigarettes  . Smokeless tobacco: Not on file  . Alcohol Use: No      Review of Systems  Constitutional: Negative.  Negative for fever, chills and fatigue.  HENT: Negative.  Negative for congestion and neck pain.   Eyes: Negative.   Respiratory: Negative.  Negative for cough.     Cardiovascular: Positive for chest pain.  Gastrointestinal: Negative.  Negative for nausea, vomiting and abdominal pain.  Genitourinary: Negative.   Musculoskeletal: Negative.   Skin: Negative.   Neurological: Negative.  Negative for weakness.  Hematological: Negative.   Psychiatric/Behavioral: Negative.     Allergies  Naproxen  Home Medications   Current Outpatient Rx  Name Route Sig Dispense Refill  . DIAZEPAM 5 MG PO TABS Oral Take 1 tablet (5 mg total) by mouth every 8 (eight) hours as needed (muscle spasm/pain). 10 tablet 0  . IBUPROFEN 800 MG PO TABS Oral Take 1 tablet (800 mg total) by mouth every 8 (eight) hours as needed for pain (1 po TID x 5 days then PRN only). 15 tablet 0    BP 115/79  Pulse 74  Temp(Src) 98.1 F (36.7 C) (Oral)  Resp 22  SpO2 98%  Physical Exam  Constitutional: He appears well-developed and well-nourished.  HENT:  Head: Normocephalic and atraumatic.  Eyes: Conjunctivae are normal.  Neck: Neck supple.  Cardiovascular: Normal rate and regular rhythm.   Pulmonary/Chest: Effort normal and breath sounds normal.  Abdominal: Soft. Bowel sounds are normal.  Musculoskeletal: Normal range of motion.       Arms:      Pt grimaces when asked to remove his coat. Very TTP over (R) chest. No other abnormality noted.  Neurological: He is alert.  Skin: Skin is warm and dry.  Psychiatric: He has a  normal mood and affect.    ED Course  Procedures Findings and impression discussed with patient. Patient reports significant relief of pain with ibuprofen Valium and hydrocodone. Explained we would treat with ibuprofen which patient admits he is taking in the past without difficulties. Will also add muscle relaxer. Pt agreeable w/ plan. I have discussed pt w/ Dr Hyacinth Meeker who is agreeable w/ plan.  Labs Reviewed - No data to display No results found.   1. Chest wall pain       MDM  HPI/PE and clinical findings c/w  1. Musculoskeletal chest wall  pain.        Roma Kayser Jabari Swoveland, NP 06/04/11 [phone]

## 2011-06-04 NOTE — ED Notes (Signed)
Pt states he is having a sharp pain in the center of his chest  Pt states it has been ongoing for a couple of months  Pt states he was seen here for it about 2.5 weeks ago and was told it was chest wall pain  Pt states the pain has gotten worse since then

## 2011-06-05 NOTE — ED Provider Notes (Signed)
Medical screening examination/treatment/procedure(s) were performed by non-physician practitioner and as supervising physician I was immediately available for consultation/collaboration.   Vida Roller, MD 06/05/11 [phone]

## 2011-07-24 ENCOUNTER — Encounter (HOSPITAL_COMMUNITY): Payer: Self-pay | Admitting: Adult Health

## 2011-07-24 ENCOUNTER — Emergency Department (HOSPITAL_COMMUNITY)
Admission: EM | Admit: 2011-07-24 | Discharge: 2011-07-24 | Disposition: A | Discharge status: Home / Self Care | Payer: Self-pay | Attending: Emergency Medicine | Admitting: Emergency Medicine

## 2011-07-24 DIAGNOSIS — L259 Unspecified contact dermatitis, unspecified cause: Secondary | ICD-10-CM | POA: Insufficient documentation

## 2011-07-24 DIAGNOSIS — F17200 Nicotine dependence, unspecified, uncomplicated: Secondary | ICD-10-CM | POA: Insufficient documentation

## 2011-07-24 MED ORDER — PREDNISONE 20 MG PO TABS
60.0000 mg | ORAL_TABLET | Freq: Once | ORAL | Status: AC
  Administered 2011-07-24: 60 mg via ORAL
  Filled 2011-07-24: qty 3

## 2011-07-24 MED ORDER — HYDROCORTISONE 2.5 % EX LOTN: TOPICAL_LOTION | Freq: Two times a day (BID) | CUTANEOUS | Status: DC

## 2011-07-24 MED ORDER — HYDROCODONE-ACETAMINOPHEN 5-325 MG PO TABS: 1.0000 | ORAL_TABLET | ORAL | Status: AC | PRN

## 2011-07-24 MED ORDER — PREDNISONE 10 MG PO TABS: 20.0000 mg | ORAL_TABLET | Freq: Two times a day (BID) | ORAL | Status: DC

## 2011-07-24 NOTE — ED Provider Notes (Signed)
History     CSN: [id number]  Arrival date & time 07/24/11  7846   First MD Initiated Contact with Patient 07/24/11 1837      Chief Complaint  Patient presents with  . Hand Pain    (Consider location/radiation/quality/duration/timing/severity/associated sxs/prior treatment) HPI History provided by pt.   Pt c/o severe, burning pain of both dorsal and palmar surface of both hands since 8pm yesterday.  Pain developed acutely and is aggravated by light palpation and hanging hands down.  Has some relief w/ elevation and icing.  No associated fever or paresthesias.  Denies trauma.  No known contacts.  Was working out in the yard yesterday but was wearing gloves, the same gloves he always wears.  Has had these symptoms in the past as well but never to this severity, and he can no pin point any specific triggers other than it possibly occuring more commonly when he is outside working/sweating.    Past Medical History  Diagnosis Date  . Back pain, chronic     Past Surgical History  Procedure Date  . Cholecystectomy     Family History  Problem Relation Age of Onset  . Cancer Father   . Diabetes Other   . Coronary artery disease Other     History  Substance Use Topics  . Smoking status: Current Everyday Smoker -- 0.5 packs/day    Types: Cigarettes  . Smokeless tobacco: Not on file  . Alcohol Use: No      Review of Systems  All other systems reviewed and are negative.    Allergies  Naproxen  Home Medications   Current Outpatient Rx  Name Route Sig Dispense Refill  . ACETAMINOPHEN 500 MG PO TABS Oral Take 500 mg by mouth every 6 (six) hours as needed. For pain relief    . IBUPROFEN 400 MG PO TABS Oral Take 400 mg by mouth every 6 (six) hours as needed. For pain    . PSEUDOEPH-DOXYLAMINE-DM-APAP 60-7.09-02-998 MG/30ML PO LIQD Oral Take 30 mLs by mouth every 6 (six) hours as needed. For bedtime cold symptom relief (cough)    . HYDROCODONE-ACETAMINOPHEN 5-325 MG PO TABS  Oral Take 1 tablet by mouth every 4 (four) hours as needed for pain. 20 tablet 0  . HYDROCORTISONE 2.5 % EX LOTN Topical Apply topically 2 (two) times daily. 59 mL 0    BP 118/63  Pulse 78  Temp(Src) 98.8 F (37.1 C) (Oral)  Resp 18  SpO2 99%  Physical Exam  Nursing note and vitals reviewed. Constitutional: He is oriented to person, place, and time. He appears well-developed and well-nourished. No distress.  HENT:  Head: Normocephalic and atraumatic.  Eyes:       Normal appearance  Neck: Normal range of motion.  Musculoskeletal:       Diffuse, discrete, blanching macular lesions dorsal surface bilateral hands and less concentrated on extensor surface of forearms.  Palmar surface of hands w/ deep red and purple blotches.  Severe ttp on palmar surface.  Full ROM fingers w/out pain. 2+ radial pulse and brisk capillary refill.  Sensation intact.    Neurological: He is alert and oriented to person, place, and time.  Psychiatric: He has a normal mood and affect. His behavior is normal.    ED Course  Procedures (including critical care time)  Labs Reviewed - No data to display No results found.   1. Contact dermatitis       MDM  Healthy 30yo M presents w/ non-traumatic, bilateral hand  pain since yesterday evening.  Worked in yard yesterday with gloves on.  On exam, non-blanching macular rash dorsal surface of hands and forearms and red/purple blotching of palms.  Palms tender to light palpation.  No NV deficits.  Pt examined by Dr. Fredderick Phenix as well.  He reported to her some mild pruritis.  Pt most likely has a contact dermatitis.  Prescribed prednisone as well as hydrocortisone cream to take if he can not tolerate reported SE of prednisone.  Prescribed vicodin for pain.  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  He believes his sx occur most commonly when he is  working in yard w/ gloves on and is sweating.  Has not had fever or any other infectious symptoms to suggest RMSF.  Referred to dermatology.  Return precautions discussed.           Otilio Miu, [place] 07/25/11 [phone]

## 2011-07-24 NOTE — ED Notes (Signed)
C/o bilateral hand pain since 8 pm lasatnight. Denies injury to hands. CMS intact. Pt states his hands feel like they can not grip.

## 2011-07-24 NOTE — Discharge Instructions (Signed)
Take prednisone as prescribed.  If you develop a reaction from it, discontinue and try the hydrocortisone cream twice a day instead.  Take vicodin as prescribed for severe pain.   Do not drive within four hours of taking this medication (may cause drowsiness or confusion).  You can also try ibuprofen or aspirin for pain.  Aspirin may give you relief depending on the cause.  Avoid ice water exposure and soaking hands for extended period of time.  Follow up with the dermatologist.  Call [facility] [phone]) if you do not have a primary care doctor and would like assistance with finding one.   You should return to the ER if your symptoms worsen or you develop fever.

## 2011-07-25 NOTE — ED Provider Notes (Signed)
Medical screening examination/treatment/procedure(s) were conducted as a shared visit with non-physician practitioner(s) and myself.  I personally evaluated the patient during the encounter PT with macular rash to both hands, forearms.  Sometimes itchy, mostly burning.  Swelling to hands.  +blanching rash, no purpura/petechiae.  No systemic symptoms.  Possible contact dermatitis.  Will tx as such and refer to derm if no improvement  Rolan Bucco, MD 07/25/11 1529

## 2011-09-14 ENCOUNTER — Emergency Department (HOSPITAL_COMMUNITY)
Admission: EM | Admit: 2011-09-14 | Discharge: 2011-09-15 | Disposition: A | Discharge status: Home / Self Care | Payer: Self-pay | Attending: Emergency Medicine | Admitting: Emergency Medicine

## 2011-09-14 ENCOUNTER — Encounter (HOSPITAL_COMMUNITY): Payer: Self-pay | Admitting: *Deleted

## 2011-09-14 DIAGNOSIS — F17200 Nicotine dependence, unspecified, uncomplicated: Secondary | ICD-10-CM | POA: Insufficient documentation

## 2011-09-14 DIAGNOSIS — L02219 Cutaneous abscess of trunk, unspecified: Secondary | ICD-10-CM | POA: Insufficient documentation

## 2011-09-14 DIAGNOSIS — L02211 Cutaneous abscess of abdominal wall: Secondary | ICD-10-CM

## 2011-09-14 MED ORDER — OXYCODONE-ACETAMINOPHEN 5-325 MG PO TABS
1.0000 | ORAL_TABLET | Freq: Once | ORAL | Status: AC
  Administered 2011-09-14: 1 via ORAL
  Filled 2011-09-14: qty 1

## 2011-09-14 NOTE — ED Notes (Signed)
Presents with red boil left side x 3 days

## 2011-09-15 MED ORDER — OXYCODONE-ACETAMINOPHEN 5-325 MG PO TABS: 1.0000 | ORAL_TABLET | Freq: Four times a day (QID) | ORAL | Status: AC | PRN

## 2011-09-15 MED ORDER — DOXYCYCLINE HYCLATE 100 MG PO CAPS: 100.0000 mg | ORAL_CAPSULE | Freq: Two times a day (BID) | ORAL | Status: DC

## 2011-09-15 MED ORDER — DOXYCYCLINE HYCLATE 100 MG PO CAPS: 100.0000 mg | ORAL_CAPSULE | Freq: Two times a day (BID) | ORAL | Status: AC

## 2011-09-15 NOTE — Discharge Instructions (Signed)
Use heat around the area. Keep the area covered. Return here in 2 days for a recheck and packing removal.

## 2011-09-15 NOTE — ED Provider Notes (Signed)
Medical screening examination/treatment/procedure(s) were performed by non-physician practitioner and as supervising physician I was immediately available for consultation/collaboration.   Jaylise Peek B. Bernette Mayers, MD 09/15/11 2124

## 2011-09-15 NOTE — ED Provider Notes (Addendum)
History     CSN: [id number]  Arrival date & time 09/14/11  2250   First MD Initiated Contact with Patient 09/14/11 2341      Chief Complaint  Patient presents with  . Cellulitis    (Consider location/radiation/quality/duration/timing/severity/associated sxs/prior treatment) HPI Patient presents emergency department with area of cellulitis and swelling to his left flank area.  Patient, states that he has not had fever, nausea, vomiting, or weakness.  Patient, states that there is some debris that came out of the wound several days ago.  Patient, states that he did not try any treatment prior to arrival for this issue Past Medical History  Diagnosis Date  . Back pain, chronic     Past Surgical History  Procedure Date  . Cholecystectomy     Family History  Problem Relation Age of Onset  . Cancer Father   . Diabetes Other   . Coronary artery disease Other     History  Substance Use Topics  . Smoking status: Current Everyday Smoker -- 0.5 packs/day    Types: Cigarettes  . Smokeless tobacco: Not on file  . Alcohol Use: No      Review of Systems All other systems negative except as documented in the HPI. All pertinent positives and negatives as reviewed in the HPI.  Allergies  Naproxen  Home Medications  No current outpatient prescriptions on file.  BP 101/64  Pulse 84  Temp(Src) 98.1 F (36.7 C) (Oral)  Resp 18  SpO2 100%  Physical Exam  Nursing note and vitals reviewed. Constitutional: He appears well-developed and well-nourished.  Abdominal:      ED Course  Procedures (including critical care time)  INCISION AND DRAINAGE Performed by: Carlyle Dolly Consent: Verbal consent obtained. Risks and benefits: risks, benefits and alternatives were discussed Type: abscess  Body area: L flank  Anesthesia: local infiltration  Local anesthetic: lidocaine 2%  Anesthetic total:8 ml  Complexity: complex Blunt dissection to break up  loculations  Drainage: purulent  Drainage amount:  moderate  Packing material: 1/4 in iodoform gauze  Patient tolerance: Patient tolerated the procedure well with no immediate complications.   The [place] was used. The pocket of pus was not deep and the area was probed not very deep into the are below the skin.   MDM         Carlyle Dolly, PA-C 09/15/11 0047  Carlyle Dolly, PA-C 09/15/11 0101

## 2011-09-15 NOTE — ED Provider Notes (Signed)
Medical screening examination/treatment/procedure(s) were performed by non-physician practitioner and as supervising physician I was immediately available for consultation/collaboration.   Millan Legan B. Bernette Mayers, MD 09/15/11 [phone]

## 2011-09-16 ENCOUNTER — Encounter (HOSPITAL_COMMUNITY): Payer: Self-pay | Admitting: Emergency Medicine

## 2011-09-16 ENCOUNTER — Emergency Department (HOSPITAL_COMMUNITY)
Admission: EM | Admit: 2011-09-16 | Discharge: 2011-09-16 | Disposition: A | Discharge status: Home / Self Care | Payer: Self-pay | Attending: Emergency Medicine | Admitting: Emergency Medicine

## 2011-09-16 DIAGNOSIS — F17200 Nicotine dependence, unspecified, uncomplicated: Secondary | ICD-10-CM | POA: Insufficient documentation

## 2011-09-16 DIAGNOSIS — L03319 Cellulitis of trunk, unspecified: Secondary | ICD-10-CM | POA: Insufficient documentation

## 2011-09-16 DIAGNOSIS — L02219 Cutaneous abscess of trunk, unspecified: Secondary | ICD-10-CM | POA: Insufficient documentation

## 2011-09-16 DIAGNOSIS — L03311 Cellulitis of abdominal wall: Secondary | ICD-10-CM

## 2011-09-16 MED ORDER — ACETAMINOPHEN-CODEINE #3 300-30 MG PO TABS
2.0000 | ORAL_TABLET | Freq: Once | ORAL | Status: AC
  Administered 2011-09-16: 2 via ORAL
  Filled 2011-09-16: qty 2

## 2011-09-16 MED ORDER — ACETAMINOPHEN-CODEINE #3 300-30 MG PO TABS: 1.0000 | ORAL_TABLET | Freq: Four times a day (QID) | ORAL | Status: AC | PRN

## 2011-09-16 NOTE — ED Provider Notes (Signed)
History     CSN: [id number]  Arrival date & time 09/16/11  1319   First MD Initiated Contact with Patient 09/16/11 1326      Chief Complaint  Patient presents with  . Wound Check    pt reports increased pain at site of I and D two days ago    (Consider location/radiation/quality/duration/timing/severity/associated sxs/prior treatment) HPI  Patient who was seen in ER 2 days ago for I&D of abscess of left upper abdominal wall with associated cellulitis that was packed and started on doxycycline returns to ER for wound recheck complaining of ongoing pain and drainage for wound site stating that the packing fell out today on its own while at work. Patient is complaining of stabbing pain that is associated with movement of abdominal pain. Denies fevers, chills, n/v/d, CP. States he is taking pain medication with only mild reduction in pain. States he is taking abx as directed.   Past Medical History  Diagnosis Date  . Back pain, chronic     Past Surgical History  Procedure Date  . Cholecystectomy     Family History  Problem Relation Age of Onset  . Cancer Father   . Diabetes Other   . Coronary artery disease Other     History  Substance Use Topics  . Smoking status: Current Everyday Smoker -- 0.5 packs/day    Types: Cigarettes  . Smokeless tobacco: Not on file  . Alcohol Use: No      Review of Systems  Allergies  Naproxen  Home Medications   Current Outpatient Rx  Name Route Sig Dispense Refill  . DOXYCYCLINE HYCLATE 100 MG PO CAPS Oral Take 1 capsule (100 mg total) by mouth 2 (two) times daily. 20 capsule 0  . OXYCODONE-ACETAMINOPHEN 5-325 MG PO TABS Oral Take 1 tablet by mouth every 6 (six) hours as needed for pain. 15 tablet 0    BP 106/60  Pulse 88  Temp 99 F (37.2 C) (Oral)  Resp 18  SpO2 97%  Physical Exam  Nursing note and vitals reviewed. Constitutional: He is oriented to person, place, and time. He appears well-developed and well-nourished. No  distress.  HENT:  Head: Normocephalic and atraumatic.  Eyes: Conjunctivae are normal.  Cardiovascular: Normal rate, regular rhythm, normal heart sounds and intact distal pulses.  Exam reveals no gallop and no friction rub.   No murmur heard. Pulmonary/Chest: Effort normal and breath sounds normal. No respiratory distress. He has no wheezes. He has no rales. He exhibits no tenderness.  Abdominal: Soft. Bowel sounds are normal. He exhibits no distension and no mass. There is tenderness. There is no rebound and no guarding.       TTP over erythema site of infection of Left upper lateral quadrant but remainder of abdomen non tender and no peritoneal signs.   Musculoskeletal: Normal range of motion.  Neurological: He is alert and oriented to person, place, and time.  Skin: Skin is warm and dry. No rash noted. He is not diaphoretic. There is erythema.       Palm sized area of erythema of left lateral upper abdomen with central linear incision line with serosanguinous drainage but no purulence. No induration or fluctuance. Mild to moderate TTP.   Psychiatric: He has a normal mood and affect.    ED Course  Procedures (including critical care time)  Patient seen by Dr. Lynelle Doctor at bedside with US revealing no fluid collection or worrisome features of deeper infection.  PO tylenol #3  Labs  Reviewed - No data to display No results found.   1. Cellulitis of abdominal wall       MDM  Ongoing cellulites of left abdominal wall with no signs of abscess. Afebrile. Serosanguinous drainage but no purulent drainage. Patient given strict precautions for return to ER for changing or worsening of symptoms otherwise advised to finish abx and establish care with a PCP for minor future health care needs especially in light of recurrent skin infections.         [place], [place] 09/16/11 1402

## 2011-09-16 NOTE — Discharge Instructions (Signed)
Apply warm compresses to infection throughout the day. Finish antibiotic. Take tylenol #3 as directed, as needed for pain but do not drive or operate machinery with pain medication use. Followup with [facility] or additional urgent care in 3-5 days for wound recheck. However return to emergency department for emergent changing or worsening symptoms. Establishment with a Primary Care provider is Very important for general health care concerns, minor illness and minor injury.

## 2011-09-16 NOTE — ED Notes (Signed)
Pt reports increased thick drainage and pain from I and D site on l/side

## 2011-09-17 NOTE — ED Provider Notes (Signed)
Medical screening examination/treatment/procedure(s) were performed by non-physician practitioner and as supervising physician I was immediately available for consultation/collaboration.    Mykenzi Vanzile R Doniqua Saxby, MD 09/17/11 1524 

## 2011-12-03 ENCOUNTER — Emergency Department (HOSPITAL_COMMUNITY): Payer: Self-pay

## 2011-12-03 ENCOUNTER — Encounter (HOSPITAL_COMMUNITY): Payer: Self-pay | Admitting: Emergency Medicine

## 2011-12-03 ENCOUNTER — Emergency Department (HOSPITAL_COMMUNITY)
Admission: EM | Admit: 2011-12-03 | Discharge: 2011-12-03 | Disposition: A | Discharge status: Home / Self Care | Payer: Self-pay | Attending: Emergency Medicine | Admitting: Emergency Medicine

## 2011-12-03 DIAGNOSIS — S3992XA Unspecified injury of lower back, initial encounter: Secondary | ICD-10-CM

## 2011-12-03 DIAGNOSIS — G8929 Other chronic pain: Secondary | ICD-10-CM | POA: Insufficient documentation

## 2011-12-03 DIAGNOSIS — W11XXXA Fall on and from ladder, initial encounter: Secondary | ICD-10-CM | POA: Insufficient documentation

## 2011-12-03 DIAGNOSIS — IMO0002 Reserved for concepts with insufficient information to code with codable children: Secondary | ICD-10-CM | POA: Insufficient documentation

## 2011-12-03 DIAGNOSIS — F17200 Nicotine dependence, unspecified, uncomplicated: Secondary | ICD-10-CM | POA: Insufficient documentation

## 2011-12-03 MED ORDER — OXYCODONE-ACETAMINOPHEN 5-325 MG PO TABS
2.0000 | ORAL_TABLET | Freq: Once | ORAL | Status: AC
  Administered 2011-12-03: 2 via ORAL
  Filled 2011-12-03: qty 2

## 2011-12-03 MED ORDER — TRAMADOL HCL 50 MG PO TABS: 50.0000 mg | ORAL_TABLET | Freq: Four times a day (QID) | ORAL | Status: AC | PRN

## 2011-12-03 MED ORDER — CYCLOBENZAPRINE HCL 10 MG PO TABS
10.0000 mg | ORAL_TABLET | Freq: Once | ORAL | Status: AC
  Administered 2011-12-03: 10 mg via ORAL
  Filled 2011-12-03: qty 1

## 2011-12-03 MED ORDER — OXYCODONE-ACETAMINOPHEN 5-325 MG PO TABS: 1.0000 | ORAL_TABLET | Freq: Four times a day (QID) | ORAL | Status: AC | PRN

## 2011-12-03 MED ORDER — CYCLOBENZAPRINE HCL 10 MG PO TABS: 10.0000 mg | ORAL_TABLET | Freq: Two times a day (BID) | ORAL | Status: AC | PRN

## 2011-12-03 NOTE — ED Notes (Signed)
Pt states he fell off a ladder about 4 ft. Pt c/o lower back pain. Pt denies neck pain or pain elsewhere. Pt states he has a hx of back injuries. Pt ambulatory at triage to room.

## 2011-12-03 NOTE — ED Provider Notes (Signed)
Medical screening examination/treatment/procedure(s) were performed by non-physician practitioner and as supervising physician I was immediately available for consultation/collaboration.  Ethelda Chick, MD 12/03/11 2239

## 2011-12-03 NOTE — ED Provider Notes (Signed)
History     CSN: [id number]  Arrival date & time 12/03/11  1720   First MD Initiated Contact with Patient 12/03/11 1753      Chief Complaint  Patient presents with  . Back Pain    (Consider location/radiation/quality/duration/timing/severity/associated sxs/prior treatment) HPI Comments: Brandon Blake 31 y.o. male   The chief complaint is: Patient presents with:   Back Pain   The patient has medical history significant for:   Past Medical History:   Back pain, chronic                                          Patient present after falling backward from a ladder. Patient fell from 4 feet. Complain of intense lower back pain rated 10/10 without radiation or transmission. Patient states that the fall was his fault because he was "horseing" around. Denies fever or chills. Denies neck pain or pain in other areas of his back. Denies bowel or bladder incontinence.      The history is provided by the patient.    Past Medical History  Diagnosis Date  . Back pain, chronic     Past Surgical History  Procedure Date  . Cholecystectomy     Family History  Problem Relation Age of Onset  . Cancer Father   . Diabetes Other   . Coronary artery disease Other     History  Substance Use Topics  . Smoking status: Current Everyday Smoker -- 0.5 packs/day    Types: Cigarettes  . Smokeless tobacco: Not on file  . Alcohol Use: No      Review of Systems  Constitutional: Negative for fever and chills.  Gastrointestinal: Negative for nausea, vomiting, abdominal pain and diarrhea.  Musculoskeletal: Positive for back pain.  Skin: Positive for color change.  All other systems reviewed and are negative.    Allergies  Naproxen  Home Medications  No current outpatient prescriptions on file.  BP 135/75  Pulse 82  Temp 99 F (37.2 C)  Resp 17  SpO2 98%  Physical Exam  Nursing note and vitals reviewed. Constitutional: He appears well-developed and well-nourished.  He appears distressed.  HENT:  Head: Normocephalic and atraumatic.  Mouth/Throat: Oropharynx is clear and moist.  Eyes: Conjunctivae and EOM are normal. No scleral icterus.  Neck: Normal range of motion. Neck supple.  Cardiovascular: Normal rate, regular rhythm and normal heart sounds.   Pulmonary/Chest: Effort normal and breath sounds normal.  Abdominal: Soft. Bowel sounds are normal. There is tenderness.  Musculoskeletal: He exhibits tenderness. He exhibits no edema.       Patient has decreased ROM and cannot bend or walk without pain. Extreme tenderness to palpation of lumbosacral paraspinal muscles.  Skin: Skin is warm and dry. There is erythema.       Patient has a large elliptical shaped bruise on his lower back at the site of the fall. No hematoma present.    ED Course  Procedures (including critical care time)  Labs Reviewed - No data to display Dg Lumbar Spine Complete  12/03/2011  *RADIOLOGY REPORT*  Clinical Data: Larey Seat off ladder several hours ago, pain.  LUMBAR SPINE - COMPLETE 4+ VIEW  Comparison:  None.  Findings:  There is no evidence of lumbar spine fracture. Alignment is normal.  Intervertebral disc spaces are maintained.  IMPRESSION: Negative.   Original Report Authenticated By: Elsie Stain, M.D.  1. Back injury   2. Fall from ladder       MDM  Patient present s/p fall backwards 4 feet from ladder. Patient given pain medication in ED with improvement. Imaging of lumbar spine unremarkable for injury. Patient discharged on pain medication and muscle relaxer. Return precautions given verbally and in discharge summary. No red flags for fracture, hematoma, or cauda equina.        Pixie Casino, PA-C 12/03/11 2221

## 2011-12-05 ENCOUNTER — Emergency Department (HOSPITAL_COMMUNITY)
Admission: EM | Admit: 2011-12-05 | Discharge: 2011-12-05 | Disposition: A | Discharge status: Home / Self Care | Payer: Self-pay | Attending: Emergency Medicine | Admitting: Emergency Medicine

## 2011-12-05 ENCOUNTER — Encounter (HOSPITAL_COMMUNITY): Payer: Self-pay | Admitting: *Deleted

## 2011-12-05 DIAGNOSIS — K0889 Other specified disorders of teeth and supporting structures: Secondary | ICD-10-CM

## 2011-12-05 DIAGNOSIS — G8929 Other chronic pain: Secondary | ICD-10-CM | POA: Insufficient documentation

## 2011-12-05 DIAGNOSIS — K029 Dental caries, unspecified: Secondary | ICD-10-CM | POA: Insufficient documentation

## 2011-12-05 DIAGNOSIS — F17200 Nicotine dependence, unspecified, uncomplicated: Secondary | ICD-10-CM | POA: Insufficient documentation

## 2011-12-05 MED ORDER — PENICILLIN V POTASSIUM 500 MG PO TABS: 500.0000 mg | ORAL_TABLET | Freq: Four times a day (QID) | ORAL | Status: AC

## 2011-12-05 NOTE — ED Notes (Signed)
Pt c/o right lower jaw pain x 1 day

## 2011-12-05 NOTE — ED Provider Notes (Signed)
History     CSN: [id number]  Arrival date & time 12/05/11  0526   First MD Initiated Contact with Patient 12/05/11 [phone]      Chief Complaint  Patient presents with  . Dental Pain    (Consider location/radiation/quality/duration/timing/severity/associated sxs/prior treatment) HPI  Brandon Blake is a 31 y.o. male complaining of right lower jaw tooth pain onset this a.m. Pain is 10 out of 10, nonradiating exacerbated by movement or chewing. Patient has not had issues with this tooth in the past. He denies fever nausea and vomiting. He has no dentist. Patient is taking no pain medications.   Past Medical History  Diagnosis Date  . Back pain, chronic     Past Surgical History  Procedure Date  . Cholecystectomy     Family History  Problem Relation Age of Onset  . Cancer Father   . Diabetes Other   . Coronary artery disease Other     History  Substance Use Topics  . Smoking status: Current Everyday Smoker -- 0.5 packs/day    Types: Cigarettes  . Smokeless tobacco: Not on file  . Alcohol Use: No      Review of Systems  Constitutional: Negative for fever.  HENT: Positive for ear pain.   All other systems reviewed and are negative.    Allergies  Naproxen  Home Medications   Current Outpatient Rx  Name Route Sig Dispense Refill  . CYCLOBENZAPRINE HCL 10 MG PO TABS Oral Take 1 tablet (10 mg total) by mouth 2 (two) times daily as needed for muscle spasms. 20 tablet 0  . OXYCODONE-ACETAMINOPHEN 5-325 MG PO TABS Oral Take 1 tablet by mouth every 6 (six) hours as needed for pain. 20 tablet 0  . TRAMADOL HCL 50 MG PO TABS Oral Take 1 tablet (50 mg total) by mouth every 6 (six) hours as needed for pain. 15 tablet 0    BP 118/92  Pulse 83  Temp 98.2 F (36.8 C)  Resp 20  SpO2 98%  Physical Exam  Nursing note and vitals reviewed. Constitutional: He is oriented to person, place, and time. He appears well-developed and well-nourished. No distress.  HENT:    Head: Normocephalic.       Multiple dental caries, no abscess appreciated, no induration to cheek.  Eyes: Conjunctivae and EOM are normal.  Neck: Normal range of motion.  Cardiovascular: Normal rate.   Pulmonary/Chest: Effort normal.  Abdominal: Soft.  Musculoskeletal: Normal range of motion.  Lymphadenopathy:    He has no cervical adenopathy.  Neurological: He is alert and oriented to person, place, and time.  Psychiatric: He has a normal mood and affect.    ED Course  Procedures (including critical care time)  Labs Reviewed - No data to display    1. Pain, dental       MDM  31 y.o. -year-old male complaining of dental pain onset this a.m. Patient was seen here 48 hours ago for back pain status post fall. He was prescribed Percocet, Flexeril and tramadol. He states that he has not yet filled his prescriptions. I will write the patient a prescription for Penicillin VK and encourage him to fill his pain medication scripts. I will refer him to a dentist for definitive care. Pt verbalized understanding and agrees with care plan. Outpatient follow-up and return precautions given.           [name], PA-C 12/05/11 [phone]

## 2011-12-09 NOTE — ED Provider Notes (Signed)
Medical screening examination/treatment/procedure(s) were performed by non-physician practitioner and as supervising physician I was immediately available for consultation/collaboration.   Sunnie Nielsen, MD 12/09/11 [phone]

## 2012-03-15 ENCOUNTER — Encounter (HOSPITAL_COMMUNITY): Payer: Self-pay | Admitting: *Deleted

## 2012-03-15 ENCOUNTER — Emergency Department (HOSPITAL_COMMUNITY)
Admission: EM | Admit: 2012-03-15 | Discharge: 2012-03-15 | Disposition: A | Discharge status: Home / Self Care | Payer: Self-pay | Attending: Emergency Medicine | Admitting: Emergency Medicine

## 2012-03-15 DIAGNOSIS — Y929 Unspecified place or not applicable: Secondary | ICD-10-CM | POA: Insufficient documentation

## 2012-03-15 DIAGNOSIS — Y9389 Activity, other specified: Secondary | ICD-10-CM | POA: Insufficient documentation

## 2012-03-15 DIAGNOSIS — IMO0002 Reserved for concepts with insufficient information to code with codable children: Secondary | ICD-10-CM | POA: Insufficient documentation

## 2012-03-15 DIAGNOSIS — F17200 Nicotine dependence, unspecified, uncomplicated: Secondary | ICD-10-CM | POA: Insufficient documentation

## 2012-03-15 DIAGNOSIS — G8929 Other chronic pain: Secondary | ICD-10-CM | POA: Insufficient documentation

## 2012-03-15 DIAGNOSIS — M549 Dorsalgia, unspecified: Secondary | ICD-10-CM

## 2012-03-15 DIAGNOSIS — X500XXA Overexertion from strenuous movement or load, initial encounter: Secondary | ICD-10-CM | POA: Insufficient documentation

## 2012-03-15 MED ORDER — IBUPROFEN 600 MG PO TABS: 600.0000 mg | ORAL_TABLET | Freq: Four times a day (QID) | ORAL | Status: DC | PRN

## 2012-03-15 MED ORDER — OXYCODONE-ACETAMINOPHEN 5-325 MG PO TABS
2.0000 | ORAL_TABLET | Freq: Once | ORAL | Status: AC
  Administered 2012-03-15: 2 via ORAL
  Filled 2012-03-15: qty 2

## 2012-03-15 MED ORDER — OXYCODONE-ACETAMINOPHEN 5-325 MG PO TABS: 1.0000 | ORAL_TABLET | Freq: Four times a day (QID) | ORAL | Status: DC | PRN

## 2012-03-15 MED ORDER — METHOCARBAMOL 500 MG PO TABS: 1000.0000 mg | ORAL_TABLET | Freq: Four times a day (QID) | ORAL | Status: DC

## 2012-03-15 NOTE — ED Notes (Signed)
Pt has a ride home.  

## 2012-03-15 NOTE — ED Notes (Signed)
Pt reports lower back pain and bruise on lower back. Pt ambulatory to exam room.
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
## 2012-03-17 NOTE — ED Provider Notes (Signed)
Medical screening examination/treatment/procedure(s) were performed by non-physician practitioner and as supervising physician I was immediately available for consultation/collaboration.  Olivia Mackie, MD 03/17/12 2114

## 2012-05-11 ENCOUNTER — Ambulatory Visit (HOSPITAL_COMMUNITY): Payer: Self-pay

## 2012-05-11 ENCOUNTER — Emergency Department (HOSPITAL_COMMUNITY): Payer: Self-pay

## 2012-05-11 ENCOUNTER — Encounter (HOSPITAL_COMMUNITY): Payer: Self-pay | Admitting: *Deleted

## 2012-05-11 ENCOUNTER — Emergency Department (HOSPITAL_COMMUNITY)
Admission: EM | Admit: 2012-05-11 | Discharge: 2012-05-11 | Disposition: A | Discharge status: Home / Self Care | Payer: Self-pay | Attending: Emergency Medicine | Admitting: Emergency Medicine

## 2012-05-11 DIAGNOSIS — Z886 Allergy status to analgesic agent status: Secondary | ICD-10-CM | POA: Insufficient documentation

## 2012-05-11 DIAGNOSIS — G8929 Other chronic pain: Secondary | ICD-10-CM | POA: Insufficient documentation

## 2012-05-11 DIAGNOSIS — F17200 Nicotine dependence, unspecified, uncomplicated: Secondary | ICD-10-CM | POA: Insufficient documentation

## 2012-05-11 DIAGNOSIS — G40109 Localization-related (focal) (partial) symptomatic epilepsy and epileptic syndromes with simple partial seizures, not intractable, without status epilepticus: Secondary | ICD-10-CM | POA: Insufficient documentation

## 2012-05-11 DIAGNOSIS — Z9889 Other specified postprocedural states: Secondary | ICD-10-CM | POA: Insufficient documentation

## 2012-05-11 DIAGNOSIS — M549 Dorsalgia, unspecified: Secondary | ICD-10-CM | POA: Insufficient documentation

## 2012-05-11 DIAGNOSIS — Z79899 Other long term (current) drug therapy: Secondary | ICD-10-CM | POA: Insufficient documentation

## 2012-05-11 DIAGNOSIS — R209 Unspecified disturbances of skin sensation: Secondary | ICD-10-CM | POA: Insufficient documentation

## 2012-05-11 LAB — POCT I-STAT, CHEM 8
Chloride: 104 mEq/L (ref 96–112)
Glucose, Bld: 102 mg/dL — ABNORMAL HIGH (ref 70–99)
HCT: 48 % (ref 39.0–52.0)
Hemoglobin: 16.3 g/dL (ref 13.0–17.0)
Potassium: 3.8 mEq/L (ref 3.5–5.1)

## 2012-05-11 LAB — URINALYSIS, ROUTINE W REFLEX MICROSCOPIC
Leukocytes, UA: NEGATIVE
Nitrite: NEGATIVE
Specific Gravity, Urine: 1.017 (ref 1.005–1.030)
Urobilinogen, UA: 0.2 mg/dL (ref 0.0–1.0)
pH: 7 (ref 5.0–8.0)

## 2012-05-11 LAB — CBC
HCT: 45.2 % (ref 39.0–52.0)
Hemoglobin: 15.8 g/dL (ref 13.0–17.0)
MCH: 31.5 pg (ref 26.0–34.0)
MCHC: 35 g/dL (ref 30.0–36.0)
MCV: 90 fL (ref 78.0–100.0)
RDW: 12.4 % (ref 11.5–15.5)

## 2012-05-11 LAB — RAPID URINE DRUG SCREEN, HOSP PERFORMED
Barbiturates: NOT DETECTED
Tetrahydrocannabinol: POSITIVE — AB

## 2012-05-11 MED ORDER — SODIUM CHLORIDE 0.9 % IV SOLN
1000.0000 mg | INTRAVENOUS | Status: AC
  Administered 2012-05-11: 1000 mg via INTRAVENOUS
  Filled 2012-05-11: qty 10

## 2012-05-11 MED ORDER — LEVETIRACETAM 500 MG PO TABS: 500.0000 mg | ORAL_TABLET | Freq: Two times a day (BID) | ORAL | Status: DC

## 2012-05-11 MED ORDER — ONDANSETRON HCL 4 MG/2ML IJ SOLN
4.0000 mg | Freq: Once | INTRAMUSCULAR | Status: AC
  Administered 2012-05-11: 4 mg via INTRAVENOUS
  Filled 2012-05-11: qty 2

## 2012-05-11 MED ORDER — MORPHINE SULFATE 4 MG/ML IJ SOLN
4.0000 mg | Freq: Once | INTRAMUSCULAR | Status: AC
  Administered 2012-05-11: 4 mg via INTRAVENOUS
  Filled 2012-05-11: qty 1

## 2012-05-11 MED ORDER — GADOBENATE DIMEGLUMINE 529 MG/ML IV SOLN
20.0000 mL | Freq: Once | INTRAVENOUS | Status: AC | PRN
  Administered 2012-05-11: 20 mL via INTRAVENOUS

## 2012-05-11 NOTE — ED Notes (Signed)
Pt reports intermittent R arm numbness and dizziness x 2 days.  Pt denies any at this time.  Pt reports earlier today while at work, pt became dizzy and had a near syncopal episode.  Pt denies any cp or SOB at this time.  Pt ambulated to the room with steady gait.  Pt also denies one sided weakness at present.

## 2012-05-11 NOTE — ED Notes (Signed)
[id number]<AV> Expected date:<BR> Expected time:<BR> Means of arrival:<BR> Comments:<BR> 32 y/o F bilateral pitting edema

## 2012-05-11 NOTE — ED Notes (Signed)
Patient transported to MRI 

## 2012-05-11 NOTE — ED Provider Notes (Signed)
History     CSN: [id number]  Arrival date & time 05/11/12  1110   First MD Initiated Contact with Patient 05/11/12 1115      Chief Complaint  Patient presents with  . Dizziness  . Numbness    (Consider location/radiation/quality/duration/timing/severity/associated sxs/prior treatment) HPI Comments: Patient reports 2-3 year hx of intermittent dizziness followed by R arm tremor that can last 20 minutes to hours. Until yesterday they happened 2-3 times a month, for the past 2 days they have been occuring 4-5 times a day yesterday and has 2 episodes today.  Currently having a episode that has been in progress for the past 30 minutes.  Patient can not control the tremor, has decreased strength in hand grasp but full ROM at elbow and shoulder although slightly less coordinated than usual.   The history is provided by the patient.    Past Medical History  Diagnosis Date  . Back pain, chronic     Past Surgical History  Procedure Date  . Cholecystectomy     Family History  Problem Relation Age of Onset  . Cancer Father   . Diabetes Other   . Coronary artery disease Other     History  Substance Use Topics  . Smoking status: Current Every Day Smoker -- 0.5 packs/day    Types: Cigarettes  . Smokeless tobacco: Not on file  . Alcohol Use: No      Review of Systems  Constitutional: Negative for fever and chills.  HENT: Negative for hearing loss, ear pain, congestion, rhinorrhea, neck pain and neck stiffness.   Respiratory: Negative for cough.   Cardiovascular: Negative for chest pain.  Gastrointestinal: Negative for nausea.  Genitourinary: Negative for dysuria.  Musculoskeletal: Negative for joint swelling.  Skin: Negative for wound.  Neurological: Positive for dizziness, tremors and numbness. Negative for speech difficulty, weakness and headaches.    Allergies  Naproxen  Home Medications   Current Outpatient Rx  Name  Route  Sig  Dispense  Refill  . LEVETIRACETAM  500 MG PO TABS   Oral   Take 1 tablet (500 mg total) by mouth every 12 (twelve) hours.   60 tablet   0     BP 148/85  Pulse 76  Temp 98.6 F (37 C) (Oral)  Resp 18  SpO2 100%  Physical Exam  Constitutional: He is oriented to person, place, and time. He appears well-developed and well-nourished.  HENT:  Head: Normocephalic and atraumatic.  Right Ear: External ear normal.  Eyes: Pupils are equal, round, and reactive to light.  Neck: Normal range of motion. Neck supple.  Cardiovascular: Normal rate.   Pulmonary/Chest: Effort normal.  Abdominal: Soft.  Musculoskeletal: He exhibits no edema and no tenderness.       Right shoulder: Normal.       Right elbow: Normal.      Right wrist: He exhibits decreased range of motion. He exhibits no tenderness, no swelling, no effusion, no crepitus, no deformity and no laceration.  Neurological: He is alert and oriented to person, place, and time. He displays tremor. No cranial nerve deficit or sensory deficit. Coordination abnormal.       Weaker grip R hand tremor persistent   Skin: Skin is warm.    ED Course  Procedures (including critical care time)  Labs Reviewed  URINE RAPID DRUG SCREEN (HOSP PERFORMED) - Abnormal; Notable for the following:    Opiates POSITIVE (*)     Tetrahydrocannabinol POSITIVE (*)     All  other components within normal limits  POCT I-STAT, CHEM 8 - Abnormal; Notable for the following:    Glucose, Bld 102 (*)     All other components within normal limits  CBC  URINALYSIS, ROUTINE W REFLEX MICROSCOPIC   Ct Head Wo Contrast  05/11/2012  *RADIOLOGY REPORT*  Clinical Data: Dizziness, lightheadedness, severe rt arm pain, documented focal seizure  CT HEAD WITHOUT CONTRAST  Technique:  Contiguous axial images were obtained from the base of the skull through the vertex without contrast.  Comparison: 11/27/2009  Findings: Very subtle rounded hypodensity noted along the inferior margin of the left lentiform nucleus.   Otherwise, the brain stem, cerebellum, cerebral peduncles, thalami, basal ganglia, basilar cisterns, and ventricular system appear unremarkable.  No intracranial hemorrhage, mass lesion, or acute infarction is identified.  Polypoid mucoperiosteal thickening noted in the left maxillary sinus.  There is mild chronic ethmoid sinusitis.  Minimal chronic sphenoid sinusitis noted.  IMPRESSION:  1.  Equivocal/subtle rounded hypodensity in the left lentiform nucleus inferiorly. In most settings I would ascribe this to volume averaging artifact.  However, given the patient's symptoms, there is a small chance that this could represent an actual lesion of the lentiform nucleus, and MRI of the brain with without contrast may be warranted. 2.  Chronic paranasal sinusitis.   Original Report Authenticated By: Gaylyn Rong, M.D.    Mr Laqueta Jean Wo Contrast  05/11/2012  *RADIOLOGY REPORT*  Clinical Data: Dizziness and numbness of the right arm  MRI HEAD WITHOUT AND WITH CONTRAST  Technique:  Multiplanar, multiecho pulse sequences of the brain and surrounding structures were obtained according to standard protocol without and with intravenous contrast  Contrast: 20mL MULTIHANCE GADOBENATE DIMEGLUMINE 529 MG/ML IV SOLN  Comparison: Head CT same day.  CT 11/27/2009 and 12/03/2008.  Findings: Diffusion imaging does not show any acute or subacute infarction.  There is no abnormality of the brainstem or cerebellum.  The right cerebral hemispheres normal.  There is a chronic focus of gliosis adjacent to the frontal horn of the left lateral ventricle, discernible on an old head CT exams. No mass lesion, hemorrhage, hydrocephalus or extra-axial collection.  No pituitary mass.  No inflammatory sinus disease.  No skull or skull base lesion.  IMPRESSION: No acute finding.  No left basal ganglia infarction as was questioned at CT.  Small focus of chronic gliosis adjacent to the frontal horn of the left lateral ventricle.   Original Report  Authenticated By: Paulina Fusi, M.D.      1. Partial seizure disorder       MDM  Spoke with DR. Roseanne Reno discussed CT and MRI results  To continue patient on Keppra 500 mg BID. Will arrange for outpatient EEG  Discussed this with the patient who verbalizes understanding about diagnosis, driving restrictions and follow up         Arman Filter, NP 05/11/12 1544

## 2012-05-13 ENCOUNTER — Ambulatory Visit (HOSPITAL_COMMUNITY)
Admission: RE | Admit: 2012-05-13 | Discharge: 2012-05-13 | Disposition: A | Discharge status: Home / Self Care | Payer: Medicaid Other | Source: Ambulatory Visit | Attending: Neurology | Admitting: Neurology

## 2012-05-13 DIAGNOSIS — R259 Unspecified abnormal involuntary movements: Secondary | ICD-10-CM | POA: Insufficient documentation

## 2012-05-13 DIAGNOSIS — G40109 Localization-related (focal) (partial) symptomatic epilepsy and epileptic syndromes with simple partial seizures, not intractable, without status epilepticus: Secondary | ICD-10-CM

## 2012-05-13 NOTE — Progress Notes (Signed)
EEG completed.

## 2012-05-13 NOTE — Procedures (Signed)
EEG NUMBER:  [id number].  REFERRING PHYSICIAN:  Unavailable.  INDICATION FOR STUDY:  A 32 year old with episodes of recurrent shaking involving his right upper extremity.  Study is being performed to rule out possible focal seizure disorder.  DESCRIPTION:  This is a routine EEG recording performed during wakefulness and during sleep.  Predominant background activity during wakefulness consisted of 9 Hz symmetrical alpha rhythm.  There was good attenuation of alpha activity with eye opening.  Photic stimulation produced symmetrical occipital driving response.  Hyperventilation produced a normal symmetrical transient generalized slowing response. During sleep, symmetrical vertex waves, sleep spindles, and K complexes were recorded.  No evidence of an epileptic disorder was seen.  There were no areas of abnormal slowing.  INTERPRETATION:  This is normal EEG recording during wakefulness and during sleep.     Noel Christmas, MD    [id number] D:  05/13/2012 14:23:04  T:  05/13/2012 22:37:33  Job #:  [id number]

## 2012-05-15 NOTE — ED Provider Notes (Signed)
History/physical exam/procedure(s) were performed by non-physician practitioner and as supervising physician I was immediately available for consultation/collaboration. I have reviewed all notes and am in agreement with care and plan.   Hilario Quarry, MD 05/15/12 1630

## 2012-05-29 ENCOUNTER — Encounter (HOSPITAL_COMMUNITY): Payer: Self-pay | Admitting: Emergency Medicine

## 2012-05-29 ENCOUNTER — Emergency Department (HOSPITAL_COMMUNITY)
Admission: EM | Admit: 2012-05-29 | Discharge: 2012-05-29 | Disposition: A | Discharge status: Home / Self Care | Payer: Self-pay | Attending: Emergency Medicine | Admitting: Emergency Medicine

## 2012-05-29 DIAGNOSIS — F17200 Nicotine dependence, unspecified, uncomplicated: Secondary | ICD-10-CM | POA: Insufficient documentation

## 2012-05-29 DIAGNOSIS — L02511 Cutaneous abscess of right hand: Secondary | ICD-10-CM

## 2012-05-29 DIAGNOSIS — L03119 Cellulitis of unspecified part of limb: Secondary | ICD-10-CM | POA: Insufficient documentation

## 2012-05-29 DIAGNOSIS — G40909 Epilepsy, unspecified, not intractable, without status epilepticus: Secondary | ICD-10-CM | POA: Insufficient documentation

## 2012-05-29 DIAGNOSIS — Z8614 Personal history of Methicillin resistant Staphylococcus aureus infection: Secondary | ICD-10-CM | POA: Insufficient documentation

## 2012-05-29 DIAGNOSIS — Z8739 Personal history of other diseases of the musculoskeletal system and connective tissue: Secondary | ICD-10-CM | POA: Insufficient documentation

## 2012-05-29 DIAGNOSIS — L02519 Cutaneous abscess of unspecified hand: Secondary | ICD-10-CM | POA: Insufficient documentation

## 2012-05-29 HISTORY — DX: Unspecified convulsions: R56.9

## 2012-05-29 MED ORDER — SULFAMETHOXAZOLE-TRIMETHOPRIM 800-160 MG PO TABS: 1.0000 | ORAL_TABLET | Freq: Two times a day (BID) | ORAL | Status: DC

## 2012-05-29 MED ORDER — OXYCODONE-ACETAMINOPHEN 5-325 MG PO TABS: 1.0000 | ORAL_TABLET | Freq: Four times a day (QID) | ORAL | Status: DC | PRN

## 2012-05-29 MED ORDER — OXYCODONE-ACETAMINOPHEN 5-325 MG PO TABS
1.0000 | ORAL_TABLET | Freq: Once | ORAL | Status: AC
  Administered 2012-05-29: 1 via ORAL
  Filled 2012-05-29: qty 1

## 2012-05-29 NOTE — ED Provider Notes (Signed)
History     CSN: [id number]  Arrival date & time 05/29/12  4098   First MD Initiated Contact with Patient 05/29/12 [phone]      Chief Complaint  Patient presents with  . Abscess    (Consider location/radiation/quality/duration/timing/severity/associated sxs/prior treatment) HPI Comments: 32 y.o PMH MRSA abscess, chronic back pain, partial seizure disorder.  Lesion to right wrist x 1 week.  Started as red bump but not getting better with BP powder, Advil, Excedrin.  Now with 10/10 sharp pain especially with ROM.  He is unsure if the is a bug bite. Denies other abscesses on skin in other locations today.  At one time area was draining pus but no longer.     SH: works in a [employer]; no PCP  Patient is a 32 y.o. male presenting with abscess. The history is provided by the patient. No language interpreter was used.  Abscess Location:  Hand Hand abscess location:  R wrist Abscess quality: induration, painful, redness and warmth   Abscess quality: not draining   Red streaking: no   Duration:  1 week Progression:  Worsening Pain details:    Quality:  Sharp   Severity:  Severe Relieved by:  Nothing Ineffective treatments:  Aspirin and NSAIDs Associated symptoms: no fever, no nausea and no vomiting   Risk factors: hx of MRSA and prior abscess     Past Medical History  Diagnosis Date  . Back pain, chronic   . Seizures     Past Surgical History  Procedure Laterality Date  . Cholecystectomy      Family History  Problem Relation Age of Onset  . Cancer Father   . Diabetes Other   . Coronary artery disease Other     History  Substance Use Topics  . Smoking status: Current Every Day Smoker -- 4.00 packs/day    Types: Cigarettes  . Smokeless tobacco: Not on file  . Alcohol Use: No      Review of Systems  Constitutional: Negative for fever.  Respiratory: Negative for shortness of breath.   Cardiovascular: Negative for chest pain.  Gastrointestinal:  Negative for nausea and vomiting.  Skin: Positive for wound.    Allergies  Naproxen  Home Medications   Current Outpatient Rx  Name  Route  Sig  Dispense  Refill  . levETIRAcetam (KEPPRA) 500 MG tablet   Oral   Take 1 tablet (500 mg total) by mouth every 12 (twelve) hours.   60 tablet   0   . oxyCODONE-acetaminophen (PERCOCET/ROXICET) 5-325 MG per tablet   Oral   Take 1 tablet by mouth every 6 (six) hours as needed for pain.   30 tablet   0   . sulfamethoxazole-trimethoprim (SEPTRA DS) 800-160 MG per tablet   Oral   Take 1 tablet by mouth 2 (two) times daily. For 7 days. Do not take with alcohol. ANTIBIOTIC   20 tablet   0     Generic okay     BP 127/84  Pulse 76  Temp(Src) 98.5 F (36.9 C) (Oral)  SpO2 98%  Physical Exam  Nursing note and vitals reviewed. Constitutional: He is oriented to person, place, and time. Vital signs are normal. He appears well-developed and well-nourished. He is cooperative.  HENT:  Head: Normocephalic and atraumatic.  Mouth/Throat: No oropharyngeal exudate.  Eyes: Conjunctivae are normal. Pupils are equal, round, and reactive to light. Right eye exhibits no discharge. Left eye exhibits no discharge. No scleral icterus.  Cardiovascular: Normal  rate, regular rhythm, S1 normal, S2 normal and normal heart sounds.   No murmur heard. Pulmonary/Chest: Effort normal and breath sounds normal.  Abdominal: Soft. Bowel sounds are normal. He exhibits no distension. There is no tenderness.  Musculoskeletal: He exhibits no edema.  Neurological: He is alert and oriented to person, place, and time.  Skin: Skin is warm and dry. No rash noted.     Psychiatric: He has a normal mood and affect. His speech is normal and behavior is normal. Judgment and thought content normal. Cognition and memory are normal.    ED Course  INCISION AND DRAINAGE Date/Time: 05/29/2012 8:20 AM Performed by: Annett Gula Authorized by: Gwyneth Sprout Consent:  Verbal consent obtained. Patient understanding: patient states understanding of the procedure being performed Test results: test results available and properly labeled Site marked: the operative site was not marked Type: abscess Body area: upper extremity Local anesthetic: lidocaine 1% with epinephrine Comments: Patient has had procedure previously tolerated well. No complications    (including critical care time)  Labs Reviewed - No data to display No results found.   1. Abscess of right hand       MDM  Percocet Bactrim ds bid x 10 days  Return if not improving after I&D  Shirlee Latch MD [phone]         Annett Gula, MD 05/29/12 [phone]

## 2012-05-29 NOTE — ED Provider Notes (Signed)
I saw and evaluated the patient, reviewed the resident's note and I agree with the findings and plan. Patient with evidence of an abscess to the right dorsal wrist. Normal pulse with distal swelling and erythema and some surrounding cellulitis. Abscess I&D and patient placed on antibiotics  Brandon Sprout, MD 05/29/12 [id number]

## 2012-05-29 NOTE — ED Notes (Signed)
Pt presenting to ed with c/o abscess to right hand x 1 week pt denies fever and chills at this time

## 2012-06-16 ENCOUNTER — Emergency Department (HOSPITAL_COMMUNITY)
Admission: EM | Admit: 2012-06-16 | Discharge: 2012-06-17 | Disposition: A | Discharge status: Home / Self Care | Payer: Self-pay | Attending: Emergency Medicine | Admitting: Emergency Medicine

## 2012-06-16 ENCOUNTER — Encounter (HOSPITAL_COMMUNITY): Payer: Self-pay | Admitting: *Deleted

## 2012-06-16 DIAGNOSIS — Y9389 Activity, other specified: Secondary | ICD-10-CM | POA: Insufficient documentation

## 2012-06-16 DIAGNOSIS — R569 Unspecified convulsions: Secondary | ICD-10-CM

## 2012-06-16 DIAGNOSIS — S4980XA Other specified injuries of shoulder and upper arm, unspecified arm, initial encounter: Secondary | ICD-10-CM | POA: Insufficient documentation

## 2012-06-16 DIAGNOSIS — F17200 Nicotine dependence, unspecified, uncomplicated: Secondary | ICD-10-CM | POA: Insufficient documentation

## 2012-06-16 DIAGNOSIS — W19XXXA Unspecified fall, initial encounter: Secondary | ICD-10-CM | POA: Insufficient documentation

## 2012-06-16 DIAGNOSIS — Y92009 Unspecified place in unspecified non-institutional (private) residence as the place of occurrence of the external cause: Secondary | ICD-10-CM | POA: Insufficient documentation

## 2012-06-16 DIAGNOSIS — G8929 Other chronic pain: Secondary | ICD-10-CM | POA: Insufficient documentation

## 2012-06-16 DIAGNOSIS — Z79899 Other long term (current) drug therapy: Secondary | ICD-10-CM | POA: Insufficient documentation

## 2012-06-16 DIAGNOSIS — S46909A Unspecified injury of unspecified muscle, fascia and tendon at shoulder and upper arm level, unspecified arm, initial encounter: Secondary | ICD-10-CM | POA: Insufficient documentation

## 2012-06-16 DIAGNOSIS — G40909 Epilepsy, unspecified, not intractable, without status epilepticus: Secondary | ICD-10-CM | POA: Insufficient documentation

## 2012-06-16 NOTE — ED Notes (Signed)
EMS called to home.  Found patient lying prone in floor.  Post seizure with unwitnessed fall. He is complaining of right shoulder pain.  He states he stopped taking seizure medication months ago.

## 2012-06-17 ENCOUNTER — Emergency Department (HOSPITAL_COMMUNITY): Payer: Self-pay

## 2012-06-17 LAB — CBC WITH DIFFERENTIAL/PLATELET
Basophils Relative: 0 % (ref 0–1)
Eosinophils Absolute: 0.2 10*3/uL (ref 0.0–0.7)
Eosinophils Relative: 2 % (ref 0–5)
Hemoglobin: 14 g/dL (ref 13.0–17.0)
Lymphs Abs: 3 10*3/uL (ref 0.7–4.0)
Lymphs Abs: 3.5 10*3/uL (ref 0.7–4.0)
MCH: 21.4 pg — ABNORMAL LOW (ref 26.0–34.0)
MCH: 31.3 pg (ref 26.0–34.0)
MCHC: 34.9 g/dL (ref 30.0–36.0)
MCV: 88.9 fL (ref 78.0–100.0)
Monocytes Absolute: 0.9 10*3/uL (ref 0.1–1.0)
Neutro Abs: 4.7 10*3/uL (ref 1.7–7.7)
Neutrophils Relative %: 50 % (ref 43–77)
Platelets: 144 10*3/uL — ABNORMAL LOW (ref 150–400)
Platelets: 217 10*3/uL (ref 150–400)
RBC: 3.51 MIL/uL — ABNORMAL LOW (ref 4.22–5.81)
RBC: 4.47 MIL/uL (ref 4.22–5.81)
WBC: 8.8 10*3/uL (ref 4.0–10.5)

## 2012-06-17 LAB — POCT I-STAT, CHEM 8
Creatinine, Ser: 0.8 mg/dL (ref 0.50–1.35)
HCT: 41 % (ref 39.0–52.0)
Hemoglobin: 13.9 g/dL (ref 13.0–17.0)
Potassium: 3.3 mEq/L — ABNORMAL LOW (ref 3.5–5.1)
Sodium: 143 mEq/L (ref 135–145)
TCO2: 30 mmol/L (ref 0–100)

## 2012-06-17 MED ORDER — FENTANYL CITRATE 0.05 MG/ML IJ SOLN
50.0000 ug | Freq: Once | INTRAMUSCULAR | Status: AC
  Administered 2012-06-17: 01:00:00 via INTRAVENOUS

## 2012-06-17 MED ORDER — SODIUM CHLORIDE 0.9 % IV SOLN
1000.0000 mg | Freq: Once | INTRAVENOUS | Status: AC
  Administered 2012-06-17: 1000 mg via INTRAVENOUS
  Filled 2012-06-17: qty 10

## 2012-06-17 MED ORDER — FENTANYL CITRATE 0.05 MG/ML IJ SOLN
INTRAMUSCULAR | Status: AC
  Filled 2012-06-17: qty 2

## 2012-06-17 MED ORDER — LEVETIRACETAM 500 MG PO TABS: 500.0000 mg | ORAL_TABLET | Freq: Two times a day (BID) | ORAL | Status: DC

## 2012-06-17 NOTE — ED Notes (Signed)
Patient given urinal and informed about the need for urine. Patient unable to urinate at this time.

## 2012-06-17 NOTE — ED Notes (Signed)
Patient is alert and oriented x3.  He was given DC instructions with driving restrictions and follow up visit instructions.  Patient gave verbal understanding.  He was DC ambulatory under his own power to home.  V/S stable.  He was not showing any signs of distress on DC

## 2012-06-17 NOTE — ED Provider Notes (Signed)
History     CSN: [id number]  Arrival date & time 06/16/12  2315   First MD Initiated Contact with Patient 06/17/12 0000      Chief Complaint  Patient presents with  . Seizures  . Fall  . Shoulder Pain    right side    (Consider location/radiation/quality/duration/timing/severity/associated sxs/prior treatment) Patient is a 32 y.o. male presenting with seizures, fall, and shoulder pain. The history is provided by the patient. No language interpreter was used.  Seizures Seizure activity on arrival: no   Seizure type:  Focal Preceding symptoms: no sensation of an aura present   Initial focality:  None Episode characteristics: no confusion   Return to baseline: yes   Severity:  Mild Duration: unknown. Timing:  Once Number of seizures this episode:  1 Progression:  Resolved Context: not drug use   PTA treatment:  None History of seizures: yes   Home seizure meds: not taking his keppra. Compliance with current therapy:  Poor Fall  Shoulder Pain This is a new problem. The current episode started less than 1 hour ago. The problem occurs constantly. The problem has been rapidly worsening. Nothing aggravates the symptoms. Nothing relieves the symptoms. He has tried nothing for the symptoms. The treatment provided no relief.    Past Medical History  Diagnosis Date  . Back pain, chronic   . Seizures     Past Surgical History  Procedure Laterality Date  . Cholecystectomy      Family History  Problem Relation Age of Onset  . Cancer Father   . Diabetes Other   . Coronary artery disease Other     History  Substance Use Topics  . Smoking status: Current Every Day Smoker -- 4.00 packs/day    Types: Cigarettes  . Smokeless tobacco: Not on file  . Alcohol Use: No      Review of Systems  Neurological: Positive for seizures.  All other systems reviewed and are negative.    Allergies  Naproxen  Home Medications   Current Outpatient Rx  Name  Route  Sig   Dispense  Refill  . levETIRAcetam (KEPPRA) 500 MG tablet   Oral   Take 1 tablet (500 mg total) by mouth every 12 (twelve) hours.   60 tablet   0   . oxyCODONE-acetaminophen (PERCOCET/ROXICET) 5-325 MG per tablet   Oral   Take 1 tablet by mouth every 6 (six) hours as needed for pain.   30 tablet   0     BP 117/83  Pulse 67  Temp(Src) 97.6 F (36.4 C) (Oral)  Resp 18  SpO2 95%  Physical Exam  Constitutional: He is oriented to person, place, and time. He appears well-developed and well-nourished. No distress.  HENT:  Head: Normocephalic and atraumatic.  Right Ear: No hemotympanum.  Left Ear: No hemotympanum.  Mouth/Throat: Oropharynx is clear and moist.  Eyes: Conjunctivae are normal. Pupils are equal, round, and reactive to light.  Neck: Normal range of motion. Neck supple.  Cardiovascular: Normal rate, regular rhythm and intact distal pulses.   Pulmonary/Chest: Effort normal. He has no wheezes. He has no rales.  Abdominal: Soft. Bowel sounds are normal. There is no tenderness. There is no rebound and no guarding.  Musculoskeletal: Normal range of motion. He exhibits no tenderness.  No clavicular pain FROM of the right shoulder, no sniff box tenderness biceps tendon intact  Neurological: He is alert and oriented to person, place, and time. He has normal reflexes.  Skin: Skin is  warm and dry.  Psychiatric: He has a normal mood and affect.    ED Course  Procedures (including critical care time)  Labs Reviewed  CBC WITH DIFFERENTIAL - Abnormal; Notable for the following:    RBC 3.51 (*)    Hemoglobin 7.5 (*)    HCT 31.2 (*)    MCH 21.4 (*)    MCHC 24.0 (*)    Platelets 144 (*)    All other components within normal limits  POCT I-STAT, CHEM 8 - Abnormal; Notable for the following:    Potassium 3.3 (*)    Glucose, Bld 109 (*)    All other components within normal limits  CBC WITH DIFFERENTIAL  URINE RAPID DRUG SCREEN (HOSP PERFORMED)   Dg Chest 2 View  06/17/2012   *RADIOLOGY REPORT*  Clinical Data: Seizure, fall, anterior right shoulder pain  CHEST - 2 VIEW  Comparison: 05/06/2011  Findings: Examination performed supine. Normal heart size, mediastinal contours, and pulmonary vascularity. Lungs clear. No pleural effusion or pneumothorax. No acute osseous findings.  IMPRESSION: No acute abnormalities.   Original Report Authenticated By: Ulyses Southward, M.D.    Dg Shoulder Right  06/17/2012  *RADIOLOGY REPORT*  Clinical Data: Seizure, fall, anterior right shoulder pain  RIGHT SHOULDER - 2+ VIEW  Comparison: None  Findings: Osseous mineralization grossly normal. AC joint alignment normal. No acute fracture, dislocation or bone destruction. Visualized right ribs intact.  IMPRESSION: No acute osseous abnormalities.   Original Report Authenticated By: Ulyses Southward, M.D.    Ct Head Wo Contrast  06/17/2012  *RADIOLOGY REPORT*  Clinical Data:  Seizure, fall  CT HEAD WITHOUT CONTRAST CT CERVICAL SPINE WITHOUT CONTRAST  Technique:  Multidetector CT imaging of the head and cervical spine was performed following the standard protocol without intravenous contrast.  Multiplanar CT image reconstructions of the cervical spine were also generated.  Comparison:  CT head 05/11/2012  CT HEAD  Findings: Normal ventricular morphology. No midline shift or mass effect. Normal appearance of brain parenchyma. No intracranial hemorrhage, mass lesion evidence of acute infarction. No extra-axial fluid collections. Nasal septal deviation to the left. Partial opacification of ethmoid air cells. No acute osseous findings.  IMPRESSION: No acute intracranial abnormalities.  CT CERVICAL SPINE  Findings: Visualized skull base intact. Osseous mineralization normal. Prevertebral soft tissues grossly normal thickness. Vertebral body and disc space heights maintained. No acute fracture, subluxation, or bone destruction. Lung apices clear.  IMPRESSION: No acute cervical spine abnormalities.   Original Report  Authenticated By: Ulyses Southward, M.D.    Ct Cervical Spine Wo Contrast  06/17/2012  *RADIOLOGY REPORT*  Clinical Data:  Seizure, fall  CT HEAD WITHOUT CONTRAST CT CERVICAL SPINE WITHOUT CONTRAST  Technique:  Multidetector CT imaging of the head and cervical spine was performed following the standard protocol without intravenous contrast.  Multiplanar CT image reconstructions of the cervical spine were also generated.  Comparison:  CT head 05/11/2012  CT HEAD  Findings: Normal ventricular morphology. No midline shift or mass effect. Normal appearance of brain parenchyma. No intracranial hemorrhage, mass lesion evidence of acute infarction. No extra-axial fluid collections. Nasal septal deviation to the left. Partial opacification of ethmoid air cells. No acute osseous findings.  IMPRESSION: No acute intracranial abnormalities.  CT CERVICAL SPINE  Findings: Visualized skull base intact. Osseous mineralization normal. Prevertebral soft tissues grossly normal thickness. Vertebral body and disc space heights maintained. No acute fracture, subluxation, or bone destruction. Lung apices clear.  IMPRESSION: No acute cervical spine abnormalities.   Original Report  Authenticated By: Ulyses Southward, M.D.      No diagnosis found.    MDM  Hemoglobin rechecked and consistent with i stat hemoglobin. Suspect dilution from IV on first draw.  Seizure disorder. Loaded with keppra, restart keppra and follow up with neurology.  No driving until cleared by neurology.         Jasmine Awe, MD 06/17/12 [phone]

## 2012-06-20 ENCOUNTER — Emergency Department (HOSPITAL_COMMUNITY): Payer: Self-pay

## 2012-06-20 ENCOUNTER — Encounter (HOSPITAL_COMMUNITY): Payer: Self-pay | Admitting: Emergency Medicine

## 2012-06-20 ENCOUNTER — Emergency Department (HOSPITAL_COMMUNITY)
Admission: EM | Admit: 2012-06-20 | Discharge: 2012-06-21 | Disposition: A | Discharge status: Home / Self Care | Payer: Self-pay | Attending: Emergency Medicine | Admitting: Emergency Medicine

## 2012-06-20 DIAGNOSIS — W19XXXA Unspecified fall, initial encounter: Secondary | ICD-10-CM | POA: Insufficient documentation

## 2012-06-20 DIAGNOSIS — Z79899 Other long term (current) drug therapy: Secondary | ICD-10-CM | POA: Insufficient documentation

## 2012-06-20 DIAGNOSIS — I10 Essential (primary) hypertension: Secondary | ICD-10-CM | POA: Insufficient documentation

## 2012-06-20 DIAGNOSIS — G40909 Epilepsy, unspecified, not intractable, without status epilepticus: Secondary | ICD-10-CM | POA: Insufficient documentation

## 2012-06-20 DIAGNOSIS — Y929 Unspecified place or not applicable: Secondary | ICD-10-CM | POA: Insufficient documentation

## 2012-06-20 DIAGNOSIS — G8929 Other chronic pain: Secondary | ICD-10-CM | POA: Insufficient documentation

## 2012-06-20 DIAGNOSIS — R569 Unspecified convulsions: Secondary | ICD-10-CM

## 2012-06-20 DIAGNOSIS — Y939 Activity, unspecified: Secondary | ICD-10-CM | POA: Insufficient documentation

## 2012-06-20 LAB — CBC WITH DIFFERENTIAL/PLATELET
Hemoglobin: 14.7 g/dL (ref 13.0–17.0)
Lymphocytes Relative: 31 % (ref 12–46)
Lymphs Abs: 3.3 10*3/uL (ref 0.7–4.0)
Monocytes Relative: 10 % (ref 3–12)
Neutrophils Relative %: 58 % (ref 43–77)
Platelets: 211 10*3/uL (ref 150–400)
RBC: 4.71 MIL/uL (ref 4.22–5.81)
WBC: 10.8 10*3/uL — ABNORMAL HIGH (ref 4.0–10.5)

## 2012-06-20 LAB — RAPID URINE DRUG SCREEN, HOSP PERFORMED
Amphetamines: NOT DETECTED
Benzodiazepines: NOT DETECTED
Cocaine: NOT DETECTED
Opiates: NOT DETECTED

## 2012-06-20 LAB — BASIC METABOLIC PANEL
BUN: 12 mg/dL (ref 6–23)
CO2: 26 mEq/L (ref 19–32)
Chloride: 103 mEq/L (ref 96–112)
Glucose, Bld: 80 mg/dL (ref 70–99)
Potassium: 3.8 mEq/L (ref 3.5–5.1)
Sodium: 139 mEq/L (ref 135–145)

## 2012-06-20 LAB — ETHANOL: Alcohol, Ethyl (B): <11 mg/dL (ref 0–11)

## 2012-06-20 LAB — URINALYSIS, ROUTINE W REFLEX MICROSCOPIC
Glucose, UA: NEGATIVE mg/dL
Hgb urine dipstick: NEGATIVE
Specific Gravity, Urine: 1.016 (ref 1.005–1.030)
Urobilinogen, UA: 0.2 mg/dL (ref 0.0–1.0)
pH: 6 (ref 5.0–8.0)

## 2012-06-20 MED ORDER — ONDANSETRON HCL 4 MG/2ML IJ SOLN
4.0000 mg | Freq: Once | INTRAMUSCULAR | Status: AC
  Administered 2012-06-20: 4 mg via INTRAVENOUS
  Filled 2012-06-20: qty 2

## 2012-06-20 MED ORDER — SODIUM CHLORIDE 0.9 % IV SOLN
INTRAVENOUS | Status: DC
  Administered 2012-06-20: 22:00:00 via INTRAVENOUS

## 2012-06-20 MED ORDER — HYDROMORPHONE HCL PF 1 MG/ML IJ SOLN
1.0000 mg | Freq: Once | INTRAMUSCULAR | Status: AC
  Administered 2012-06-20: 1 mg via INTRAVENOUS
  Filled 2012-06-20: qty 1

## 2012-06-20 NOTE — ED Notes (Signed)
Patient transported to X-ray 

## 2012-06-20 NOTE — ED Provider Notes (Signed)
History     CSN: [id number]  Arrival date & time 06/20/12  2115   First MD Initiated Contact with Patient 06/20/12 2149      Chief Complaint  Patient presents with  . Seizures  . Fall    (Consider location/radiation/quality/duration/timing/severity/associated sxs/prior treatment) HPI Comments: NAT LOWENTHAL is a 32 y.o. male who is here by EMS, after a fall, a period of shaking, to be evaluated for seizures, and head injury. He reportedly had been complaining of a tingling sensation in his right hand, and later fell, striking a wall and putting a hole in it. There is no reported loss of consciousness. The fall was unwitnessed. After the fall, he was confused. He was here 3 days ago and started on Keppra. This had been ongoing prescription for him but he had been off it for a month. This is the first spell he has had since restarting the  Keppra. He was unable to see the neurologist that he was referred to because of lack of funds. The patient cannot give history. He responds by grunting and moaning. He does cooperate with exam.  Family members give a history. There's been no other recent illnesses. There are no known modifying factors.  Patient is a 32 y.o. male presenting with seizures and fall. The history is provided by the patient.  Seizures Fall    Past Medical History  Diagnosis Date  . Back pain, chronic   . Seizures     Past Surgical History  Procedure Laterality Date  . Cholecystectomy      Family History  Problem Relation Age of Onset  . Cancer Father   . Diabetes Other   . Coronary artery disease Other     History  Substance Use Topics  . Smoking status: Current Every Day Smoker -- 4.00 packs/day    Types: Cigarettes  . Smokeless tobacco: Not on file  . Alcohol Use: No      Review of Systems  Neurological: Positive for seizures.  All other systems reviewed and are negative.    Allergies  Naproxen  Home Medications   Current Outpatient  Rx  Name  Route  Sig  Dispense  Refill  . levETIRAcetam (KEPPRA) 500 MG tablet   Oral   Take 1 tablet (500 mg total) by mouth 2 (two) times daily.   60 tablet   0     BP 127/87  Pulse 72  Temp(Src) 98.8 F (37.1 C) (Oral)  Resp 16  SpO2 97%  Physical Exam  Nursing note and vitals reviewed. Constitutional: He is oriented to person, place, and time. He appears well-developed and well-nourished.  HENT:  Head: Normocephalic and atraumatic.  Right Ear: External ear normal.  Left Ear: External ear normal.  Linear contusions, bilateral forehead; no associated swelling, or bleeding  Eyes: Conjunctivae and EOM are normal. Pupils are equal, round, and reactive to light.  Neck: Normal range of motion and phonation normal. Neck supple.  Cardiovascular: Normal rate, regular rhythm, normal heart sounds and intact distal pulses.   Pulmonary/Chest: Effort normal and breath sounds normal. He exhibits no bony tenderness.  Abdominal: Soft. Normal appearance. There is no tenderness.  Musculoskeletal: Normal range of motion.  No cervical spine tenderness to palpation. Mild right lumbar tenderness to palpation. There is pain with right straight leg raising at 30. There is no deformity of the shoulders, elbows, wrist, knees, or ankles. Strength is symmetric. He exhibits a somewhat diminished range of motion, bilaterally.  Neurological: He  is alert and oriented to person, place, and time. He has normal strength. No cranial nerve deficit or sensory deficit. He exhibits normal muscle tone. Coordination normal.  Skin: Skin is warm, dry and intact.  Psychiatric: He has a normal mood and affect. His behavior is normal. Judgment and thought content normal.  He is cooperative with exam and responds quickly, but does not speak, clearly intelligible words. Most responses are grunts.    ED Course  Procedures (including critical care time)      Date: 01/22/2012  Rate: 83  Rhythm: normal sinus rhythm   QRS Axis: normal  PR and QT Intervals: normal  ST/T Wave abnormalities: normal  PR and QRS Conduction Disutrbances:none  Narrative Interpretation:   Old EKG Reviewed: unchanged  05/06/12     Labs Reviewed  CBC WITH DIFFERENTIAL - Abnormal; Notable for the following:    WBC 10.8 (*)    Monocytes Absolute 1.1 (*)    All other components within normal limits  URINE RAPID DRUG SCREEN (HOSP PERFORMED) - Abnormal; Notable for the following:    Tetrahydrocannabinol POSITIVE (*)    All other components within normal limits  BASIC METABOLIC PANEL  URINALYSIS, ROUTINE W REFLEX MICROSCOPIC  ETHANOL   Dg Lumbar Spine Complete  06/20/2012  *RADIOLOGY REPORT*  Clinical Data: Seizures and fall today.  Low back pain.  LUMBAR SPINE - COMPLETE 4+ VIEW  Comparison: 12/03/2011  Findings: Five lumbar type vertebrae.  Normal alignment of the lumbar vertebrae and facet joints.  Mild endplate degenerative changes at L1-2.  Intervertebral disc space heights are otherwise preserved.  No vertebral compression deformities.  No focal bone lesion or bone destruction.  Bone cortex and trabecular architecture appear intact.  No significant changes since the previous study.  IMPRESSION: No displaced fractures identified in the lumbar spine.   Original Report Authenticated By: Burman Nieves, M.D.    Ct Head Wo Contrast  06/20/2012  *RADIOLOGY REPORT*  Clinical Data:  Patient with history of seizure disorder, presenting with an and witnessed seizure.  Patient was postictal upon arrival at the ED.  Patient reports striking the head with bruising over the right eye.  CT HEAD WITHOUT CONTRAST CT CERVICAL SPINE WITHOUT CONTRAST  Technique:  Multidetector CT imaging of the head and cervical spine was performed following the standard protocol without intravenous contrast.  Multiplanar CT image reconstructions of the cervical spine were also generated.  Comparison:  CT head and cervical spine 3 days ago.  MRI brain and CT head  05/11/2012.  CT HEAD  Findings: Ventricular system normal in size and appearance for age. Slight asymmetry in the lateral ventricles, right greater than left, is felt to be developmental and is unchanged.  No mass lesion.  No midline shift.  No acute hemorrhage or hematoma.  No extra-axial fluid collections.  No evidence of acute infarction. No focal brain parenchymal abnormality.  No significant interval change.  No skull fracture or other focal osseous abnormality involving the skull.  Mucosal thickening involving bilateral ethmoid air cells and both maxillary sinuses.  Sphenoid sinuses, bilateral mastoid air cells, and both middle ear cavities well-aerated.  IMPRESSION:  1.  Normal and stable intracranially. 2.  Chronic bilateral maxillary and ethmoid sinusitis.  CT CERVICAL SPINE  Findings: No cervical spine fractures identified.  Sagittal reconstructed images demonstrate anatomic alignment.  Disc spaces well preserved without evidence of frank disc protrusion on the soft tissue windows.  No spinal stenosis.  Facet joints intact throughout.  No significant bony  foraminal stenoses.  Coronal reformatted images demonstrate an intact craniocervical junction, intact C1-C2 articulation, and intact dens.  Lateral masses intact throughout.  IMPRESSION: No cervical spine fractures identified.  Normal examination.   Original Report Authenticated By: Hulan Saas, M.D.    Ct Cervical Spine Wo Contrast  06/20/2012  *RADIOLOGY REPORT*  Clinical Data:  Patient with history of seizure disorder, presenting with an and witnessed seizure.  Patient was postictal upon arrival at the ED.  Patient reports striking the head with bruising over the right eye.  CT HEAD WITHOUT CONTRAST CT CERVICAL SPINE WITHOUT CONTRAST  Technique:  Multidetector CT imaging of the head and cervical spine was performed following the standard protocol without intravenous contrast.  Multiplanar CT image reconstructions of the cervical spine were also  generated.  Comparison:  CT head and cervical spine 3 days ago.  MRI brain and CT head 05/11/2012.  CT HEAD  Findings: Ventricular system normal in size and appearance for age. Slight asymmetry in the lateral ventricles, right greater than left, is felt to be developmental and is unchanged.  No mass lesion.  No midline shift.  No acute hemorrhage or hematoma.  No extra-axial fluid collections.  No evidence of acute infarction. No focal brain parenchymal abnormality.  No significant interval change.  No skull fracture or other focal osseous abnormality involving the skull.  Mucosal thickening involving bilateral ethmoid air cells and both maxillary sinuses.  Sphenoid sinuses, bilateral mastoid air cells, and both middle ear cavities well-aerated.  IMPRESSION:  1.  Normal and stable intracranially. 2.  Chronic bilateral maxillary and ethmoid sinusitis.  CT CERVICAL SPINE  Findings: No cervical spine fractures identified.  Sagittal reconstructed images demonstrate anatomic alignment.  Disc spaces well preserved without evidence of frank disc protrusion on the soft tissue windows.  No spinal stenosis.  Facet joints intact throughout.  No significant bony foraminal stenoses.  Coronal reformatted images demonstrate an intact craniocervical junction, intact C1-C2 articulation, and intact dens.  Lateral masses intact throughout.  IMPRESSION: No cervical spine fractures identified.  Normal examination.   Original Report Authenticated By: Hulan Saas, M.D.    Nursing Notes Reviewed/ Care Coordinated, and agree without changes. Applicable Imaging Reviewed Interpretation of Laboratory Data incorporated into ED treatment  1. Seizure       MDM  Apparent seizure, recurrent. Secondary fall without significant injury. Seizure possibly potentiated by use of THC. Doubt metabolic instability, serious bacterial infection or impending vascular collapse; the patient is stable for discharge.    Plan: Home Medications-  usual; Home Treatments- avoid THC; Recommended follow up- PCP prn       Flint Melter, MD 06/21/12 [phone]

## 2012-06-20 NOTE — ED Notes (Signed)
MD at bedside. 

## 2012-06-20 NOTE — ED Notes (Signed)
Bed:[facility]<BR> Expected date:06/20/12<BR> Expected time: 9:09 PM<BR> Means of arrival:Ambulance<BR> Comments:<BR> 32 yo M seizure

## 2012-06-20 NOTE — ED Notes (Signed)
EKG given to EDP, Effie Shy, MD.

## 2012-06-20 NOTE — ED Notes (Addendum)
PER EMS- pt picked up from home with c/o un witnessed seizure.  Pt has hx of seizure.  Pt arrived to ED post ictal- confused. Reports head injury, bruising over right eye. Pt fell through sheet rock wall. Pt last seizure x2 weeks, pt was rx keppra.

## 2012-06-23 NOTE — Addendum Note (Signed)
Encounter addended by: Noel Christmas on: 06/23/2012 12:00 AM<BR>     Documentation filed: Clinical Notes

## 2012-09-02 ENCOUNTER — Emergency Department (HOSPITAL_COMMUNITY): Payer: Medicaid Other

## 2012-09-02 ENCOUNTER — Emergency Department (HOSPITAL_COMMUNITY)
Admission: EM | Admit: 2012-09-02 | Discharge: 2012-09-02 | Disposition: A | Discharge status: Home / Self Care | Payer: Medicaid Other | Attending: Emergency Medicine | Admitting: Emergency Medicine

## 2012-09-02 ENCOUNTER — Encounter (HOSPITAL_COMMUNITY): Payer: Self-pay | Admitting: Emergency Medicine

## 2012-09-02 DIAGNOSIS — F17200 Nicotine dependence, unspecified, uncomplicated: Secondary | ICD-10-CM | POA: Insufficient documentation

## 2012-09-02 DIAGNOSIS — Z23 Encounter for immunization: Secondary | ICD-10-CM | POA: Insufficient documentation

## 2012-09-02 DIAGNOSIS — K029 Dental caries, unspecified: Secondary | ICD-10-CM

## 2012-09-02 DIAGNOSIS — W503XXA Accidental bite by another person, initial encounter: Secondary | ICD-10-CM

## 2012-09-02 DIAGNOSIS — R6884 Jaw pain: Secondary | ICD-10-CM

## 2012-09-02 DIAGNOSIS — G40909 Epilepsy, unspecified, not intractable, without status epilepticus: Secondary | ICD-10-CM | POA: Insufficient documentation

## 2012-09-02 DIAGNOSIS — S335XXA Sprain of ligaments of lumbar spine, initial encounter: Secondary | ICD-10-CM | POA: Insufficient documentation

## 2012-09-02 DIAGNOSIS — S0993XA Unspecified injury of face, initial encounter: Secondary | ICD-10-CM | POA: Insufficient documentation

## 2012-09-02 DIAGNOSIS — IMO0002 Reserved for concepts with insufficient information to code with codable children: Secondary | ICD-10-CM | POA: Insufficient documentation

## 2012-09-02 DIAGNOSIS — K0510 Chronic gingivitis, plaque induced: Secondary | ICD-10-CM | POA: Insufficient documentation

## 2012-09-02 DIAGNOSIS — S39012A Strain of muscle, fascia and tendon of lower back, initial encounter: Secondary | ICD-10-CM

## 2012-09-02 DIAGNOSIS — Z79899 Other long term (current) drug therapy: Secondary | ICD-10-CM | POA: Insufficient documentation

## 2012-09-02 DIAGNOSIS — T07XXXA Unspecified multiple injuries, initial encounter: Secondary | ICD-10-CM

## 2012-09-02 MED ORDER — AMOXICILLIN-POT CLAVULANATE 875-125 MG PO TABS: 1.0000 | ORAL_TABLET | Freq: Two times a day (BID) | ORAL | Status: DC

## 2012-09-02 MED ORDER — OXYCODONE-ACETAMINOPHEN 5-325 MG PO TABS
1.0000 | ORAL_TABLET | Freq: Once | ORAL | Status: AC
  Administered 2012-09-02: 1 via ORAL
  Filled 2012-09-02: qty 1

## 2012-09-02 MED ORDER — TETANUS-DIPHTH-ACELL PERTUSSIS 5-2.5-18.5 LF-MCG/0.5 IM SUSP
0.5000 mL | Freq: Once | INTRAMUSCULAR | Status: AC
  Administered 2012-09-02: 0.5 mL via INTRAMUSCULAR
  Filled 2012-09-02: qty 0.5

## 2012-09-02 MED ORDER — HYDROCODONE-ACETAMINOPHEN 5-325 MG PO TABS: 1.0000 | ORAL_TABLET | Freq: Four times a day (QID) | ORAL | Status: DC | PRN
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
## 2012-09-02 NOTE — ED Provider Notes (Signed)
Medical screening examination/treatment/procedure(s) were performed by non-physician practitioner and as supervising physician I was immediately available for consultation/collaboration.   David H Yao, MD 09/02/12 2326 
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
## 2012-10-14 ENCOUNTER — Encounter (HOSPITAL_COMMUNITY): Payer: Self-pay | Admitting: Nurse Practitioner

## 2012-10-14 ENCOUNTER — Emergency Department (HOSPITAL_COMMUNITY): Payer: Medicaid Other

## 2012-10-14 ENCOUNTER — Inpatient Hospital Stay (HOSPITAL_COMMUNITY)
Admission: EM | Admit: 2012-10-14 | Discharge: 2012-10-16 | DRG: 101 | Disposition: A | Discharge status: Home / Self Care | Payer: Medicaid Other | Attending: Internal Medicine | Admitting: Internal Medicine

## 2012-10-14 DIAGNOSIS — G40802 Other epilepsy, not intractable, without status epilepticus: Principal | ICD-10-CM | POA: Diagnosis present

## 2012-10-14 DIAGNOSIS — Z9119 Patient's noncompliance with other medical treatment and regimen: Secondary | ICD-10-CM

## 2012-10-14 DIAGNOSIS — M549 Dorsalgia, unspecified: Secondary | ICD-10-CM | POA: Diagnosis present

## 2012-10-14 DIAGNOSIS — E876 Hypokalemia: Secondary | ICD-10-CM | POA: Diagnosis present

## 2012-10-14 DIAGNOSIS — G8929 Other chronic pain: Secondary | ICD-10-CM | POA: Diagnosis present

## 2012-10-14 DIAGNOSIS — F1210 Cannabis abuse, uncomplicated: Secondary | ICD-10-CM | POA: Diagnosis present

## 2012-10-14 DIAGNOSIS — F17200 Nicotine dependence, unspecified, uncomplicated: Secondary | ICD-10-CM | POA: Diagnosis present

## 2012-10-14 DIAGNOSIS — Z91199 Patient's noncompliance with other medical treatment and regimen due to unspecified reason: Secondary | ICD-10-CM

## 2012-10-14 DIAGNOSIS — Z79899 Other long term (current) drug therapy: Secondary | ICD-10-CM

## 2012-10-14 DIAGNOSIS — G40109 Localization-related (focal) (partial) symptomatic epilepsy and epileptic syndromes with simple partial seizures, not intractable, without status epilepticus: Secondary | ICD-10-CM

## 2012-10-14 DIAGNOSIS — R569 Unspecified convulsions: Secondary | ICD-10-CM | POA: Diagnosis present

## 2012-10-14 LAB — URINALYSIS, ROUTINE W REFLEX MICROSCOPIC
Ketones, ur: NEGATIVE mg/dL
Leukocytes, UA: NEGATIVE
Nitrite: NEGATIVE
Specific Gravity, Urine: 1.025 (ref 1.005–1.030)
pH: 6 (ref 5.0–8.0)

## 2012-10-14 LAB — CBC WITH DIFFERENTIAL/PLATELET
Basophils Absolute: 0 10*3/uL (ref 0.0–0.1)
Basophils Relative: 0 % (ref 0–1)
Hemoglobin: 13.8 g/dL (ref 13.0–17.0)
Lymphocytes Relative: 23 % (ref 12–46)
MCHC: 34.8 g/dL (ref 30.0–36.0)
Neutro Abs: 7 10*3/uL (ref 1.7–7.7)
Neutrophils Relative %: 68 % (ref 43–77)
RDW: 12.2 % (ref 11.5–15.5)
WBC: 10.3 10*3/uL (ref 4.0–10.5)

## 2012-10-14 LAB — BASIC METABOLIC PANEL
Chloride: 105 mEq/L (ref 96–112)
GFR calc Af Amer: >90 mL/min (ref >90)
Potassium: 3.4 mEq/L — ABNORMAL LOW (ref 3.5–5.1)

## 2012-10-14 LAB — RAPID URINE DRUG SCREEN, HOSP PERFORMED
Benzodiazepines: NOT DETECTED
Cocaine: NOT DETECTED
Opiates: NOT DETECTED

## 2012-10-14 MED ORDER — SODIUM CHLORIDE 0.9 % IV SOLN
500.0000 mg | Freq: Two times a day (BID) | INTRAVENOUS | Status: DC
  Administered 2012-10-15 – 2012-10-16 (×2): 500 mg via INTRAVENOUS
  Filled 2012-10-14 (×4): qty 5

## 2012-10-14 MED ORDER — ACETAMINOPHEN 325 MG PO TABS: 650.0000 mg | ORAL_TABLET | Freq: Four times a day (QID) | ORAL | Status: DC | PRN

## 2012-10-14 MED ORDER — LORAZEPAM 2 MG/ML IJ SOLN
1.0000 mg | INTRAMUSCULAR | Status: DC | PRN
  Administered 2012-10-15 – 2012-10-16 (×4): 1 mg via INTRAVENOUS
  Filled 2012-10-14 (×4): qty 1

## 2012-10-14 MED ORDER — POTASSIUM CHLORIDE 10 MEQ/100ML IV SOLN
10.0000 meq | Freq: Once | INTRAVENOUS | Status: AC
  Administered 2012-10-14: 10 meq via INTRAVENOUS
  Filled 2012-10-14: qty 100

## 2012-10-14 MED ORDER — ENOXAPARIN SODIUM 60 MG/0.6ML ~~LOC~~ SOLN
60.0000 mg | SUBCUTANEOUS | Status: DC
  Administered 2012-10-14 – 2012-10-15 (×2): 60 mg via SUBCUTANEOUS
  Filled 2012-10-14 (×4): qty 0.6

## 2012-10-14 MED ORDER — HYDROMORPHONE HCL PF 1 MG/ML IJ SOLN
0.5000 mg | INTRAMUSCULAR | Status: DC | PRN
  Administered 2012-10-15 (×2): 0.5 mg via INTRAVENOUS
  Administered 2012-10-15 – 2012-10-16 (×6): 1 mg via INTRAVENOUS
  Filled 2012-10-14 (×8): qty 1

## 2012-10-14 MED ORDER — SODIUM CHLORIDE 0.9 % IV SOLN
Freq: Once | INTRAVENOUS | Status: AC
  Administered 2012-10-14: 19:00:00 via INTRAVENOUS

## 2012-10-14 MED ORDER — OXYCODONE HCL 5 MG PO TABS
5.0000 mg | ORAL_TABLET | ORAL | Status: DC | PRN
  Administered 2012-10-16: 5 mg via ORAL
  Filled 2012-10-14: qty 1

## 2012-10-14 MED ORDER — LORAZEPAM 2 MG/ML IJ SOLN
INTRAMUSCULAR | Status: AC
  Filled 2012-10-14: qty 1

## 2012-10-14 MED ORDER — ACETAMINOPHEN 650 MG RE SUPP: 650.0000 mg | Freq: Four times a day (QID) | RECTAL | Status: DC | PRN

## 2012-10-14 MED ORDER — LORAZEPAM 2 MG/ML IJ SOLN
2.0000 mg | Freq: Once | INTRAMUSCULAR | Status: AC
  Administered 2012-10-14: 2 mg via INTRAVENOUS

## 2012-10-14 MED ORDER — ONDANSETRON HCL 4 MG/2ML IJ SOLN: 4.0000 mg | Freq: Four times a day (QID) | INTRAMUSCULAR | Status: DC | PRN

## 2012-10-14 MED ORDER — LORAZEPAM 2 MG/ML IJ SOLN
INTRAMUSCULAR | Status: AC
  Administered 2012-10-14: 2 mg via INTRAVENOUS
  Filled 2012-10-14: qty 1

## 2012-10-14 MED ORDER — ENOXAPARIN SODIUM 40 MG/0.4ML ~~LOC~~ SOLN: 40.0000 mg | SUBCUTANEOUS | Status: DC

## 2012-10-14 MED ORDER — LEVETIRACETAM 500 MG PO TABS: 500.0000 mg | ORAL_TABLET | Freq: Two times a day (BID) | ORAL | Status: DC

## 2012-10-14 MED ORDER — ONDANSETRON HCL 4 MG PO TABS: 4.0000 mg | ORAL_TABLET | Freq: Four times a day (QID) | ORAL | Status: DC | PRN

## 2012-10-14 MED ORDER — DEXTROSE-NACL 5-0.45 % IV SOLN
INTRAVENOUS | Status: DC
  Administered 2012-10-14 – 2012-10-15 (×2): via INTRAVENOUS

## 2012-10-14 MED ORDER — LORAZEPAM 2 MG/ML IJ SOLN
INTRAMUSCULAR | Status: AC
  Administered 2012-10-14: 2 mg
  Filled 2012-10-14: qty 1

## 2012-10-14 MED ORDER — SODIUM CHLORIDE 0.9 % IV SOLN
1000.0000 mg | Freq: Once | INTRAVENOUS | Status: AC
  Administered 2012-10-14: 1000 mg via INTRAVENOUS
  Filled 2012-10-14: qty 10

## 2012-10-14 NOTE — ED Provider Notes (Signed)
History    CSN: [id number] Arrival date & time 10/14/12  1641  First MD Initiated Contact with Patient 10/14/12 1649     Chief Complaint  Patient presents with  . Seizures   (Consider location/radiation/quality/duration/timing/severity/associated sxs/prior Treatment) HPI Comments: Patient is a 32 year old male past medical history significant for seizure disorder presenting to the emergency department after an unwitnessed seizure earlier today. The patient's wife found the patient by the mailbox shaking, she endorsed that this was normal for his post ictal phase. Patient states he was walking and that is the last thing he remembers prior to arriving at the ED. Patient is complaining of head, neck, and abdominal pain that he states is constant and sharp, but is non-radiating. Patient states he did not take his seizure medications today. According to the cousin, patient has not been following up with his neurologist or complaint with his Keppra for longer than one day. Dr. Modesto Charon is his neurologist at [facility].   Patient is a 32 y.o. male presenting with seizures. The history is provided by the patient, the EMS personnel and a relative.  Seizures    Past Medical History  Diagnosis Date  . Back pain, chronic   . Seizures    Past Surgical History  Procedure Laterality Date  . Cholecystectomy     Family History  Problem Relation Age of Onset  . Cancer Father   . Diabetes Other   . Coronary artery disease Other    History  Substance Use Topics  . Smoking status: Current Every Day Smoker -- 0.75 packs/day    Types: Cigarettes  . Smokeless tobacco: Not on file  . Alcohol Use: No    Review of Systems  HENT: Positive for neck pain.   Gastrointestinal: Positive for abdominal pain.  Skin: Positive for rash.  Neurological: Positive for seizures and headaches.    Allergies  Naproxen  Home Medications   No current outpatient prescriptions on file. BP 116/73  Pulse 75   Temp(Src) 99 F (37.2 C) (Oral)  Resp 16  Ht 6' 0.05" (1.83 m)  Wt 255 lb (115.667 kg)  BMI 34.54 kg/m2  SpO2 98% Physical Exam  Constitutional: He is oriented to person, place, and time. He appears well-developed and well-nourished. No distress.  HENT:  Head: Normocephalic and atraumatic.  Mouth/Throat: Oropharynx is clear and moist.  Eyes: Conjunctivae and EOM are normal. Pupils are equal, round, and reactive to light.  Neck: Neck supple.  C-collar in place  Cardiovascular: Normal rate, regular rhythm, normal heart sounds and intact distal pulses.   Pulmonary/Chest: Effort normal and breath sounds normal. No respiratory distress. He exhibits no tenderness.  Abdominal: Soft. Bowel sounds are normal. There is tenderness.  Musculoskeletal: Normal range of motion. He exhibits no edema and no tenderness.  Neurological: He is alert and oriented to person, place, and time. He has normal strength. No cranial nerve deficit or sensory deficit.  Skin: Skin is warm and dry. Rash noted. He is not diaphoretic.  Psychiatric: He has a normal mood and affect.    ED Course  Procedures (including critical care time)  Medications  LORazepam (ATIVAN) 2 MG/ML injection (not administered)  acetaminophen (TYLENOL) tablet 650 mg (not administered)    Or  acetaminophen (TYLENOL) suppository 650 mg (not administered)  oxyCODONE (Oxy IR/ROXICODONE) immediate release tablet 5 mg (not administered)  HYDROmorphone (DILAUDID) injection 0.5-1 mg (not administered)  ondansetron (ZOFRAN) tablet 4 mg (not administered)    Or  ondansetron (ZOFRAN) injection  4 mg (not administered)  dextrose 5 %-0.45 % sodium chloride infusion ( Intravenous New Bag/Given 10/14/12 2134)  levETIRAcetam (KEPPRA) 500 mg in sodium chloride 0.9 % 100 mL IVPB (not administered)  LORazepam (ATIVAN) injection 1 mg (not administered)  potassium chloride 10 mEq in 100 mL IVPB (not administered)  enoxaparin (LOVENOX) injection 60 mg (not  administered)  LORazepam (ATIVAN) injection 2 mg (2 mg Intravenous Given 10/14/12 1813)  levETIRAcetam (KEPPRA) 1,000 mg in sodium chloride 0.9 % 100 mL IVPB (0 mg Intravenous Stopped 10/14/12 1828)  LORazepam (ATIVAN) 2 MG/ML injection (2 mg  Given 10/14/12 1828)  0.9 %  sodium chloride infusion ( Intravenous New Bag/Given 10/14/12 1854)    Date: 10/14/2012  Rate: 75  Rhythm: normal sinus rhythm  QRS Axis: normal  Intervals: normal  ST/T Wave abnormalities: normal  Conduction Disutrbances:none  Narrative Interpretation:   Old EKG Reviewed: unchanged   Back board removed.   Labs Reviewed  BASIC METABOLIC PANEL - Abnormal; Notable for the following:    Potassium 3.4 (*)    All other components within normal limits  URINE RAPID DRUG SCREEN (HOSP PERFORMED) - Abnormal; Notable for the following:    Tetrahydrocannabinol POSITIVE (*)    All other components within normal limits  CBC WITH DIFFERENTIAL  URINALYSIS, ROUTINE W REFLEX MICROSCOPIC  MAGNESIUM  LEVETIRACETAM LEVEL  BASIC METABOLIC PANEL  CBC   Ct Head Wo Contrast  10/14/2012   *RADIOLOGY REPORT*  Clinical Data:  Seizure.  Fall  CT HEAD WITHOUT CONTRAST CT CERVICAL SPINE WITHOUT CONTRAST  Technique:  Multidetector CT imaging of the head and cervical spine was performed following the standard protocol without intravenous contrast.  Multiplanar CT image reconstructions of the cervical spine were also generated.  Comparison:  CT 06/20/2012  CT HEAD  Findings: Ventricle size is normal.  Negative for infarct, hemorrhage, or mass lesion.  The brain appears normal.  There is no skull fracture.  Chronic sinusitis is present. There is an air- fluid level in the sphenoid sinus.  IMPRESSION: No significant intracranial abnormality.  Sinusitis with air-fluid level in the sphenoid sinus.  CT CERVICAL SPINE  Findings: Negative for fracture.  Normal alignment.  Mild disc degeneration with early spurring at C4-5.  IMPRESSION: Negative for fracture.    Original Report Authenticated By: Janeece Riggers, M.D.   Ct Cervical Spine Wo Contrast  10/14/2012   *RADIOLOGY REPORT*  Clinical Data:  Seizure.  Fall  CT HEAD WITHOUT CONTRAST CT CERVICAL SPINE WITHOUT CONTRAST  Technique:  Multidetector CT imaging of the head and cervical spine was performed following the standard protocol without intravenous contrast.  Multiplanar CT image reconstructions of the cervical spine were also generated.  Comparison:  CT 06/20/2012  CT HEAD  Findings: Ventricle size is normal.  Negative for infarct, hemorrhage, or mass lesion.  The brain appears normal.  There is no skull fracture.  Chronic sinusitis is present. There is an air- fluid level in the sphenoid sinus.  IMPRESSION: No significant intracranial abnormality.  Sinusitis with air-fluid level in the sphenoid sinus.  CT CERVICAL SPINE  Findings: Negative for fracture.  Normal alignment.  Mild disc degeneration with early spurring at C4-5.  IMPRESSION: Negative for fracture.   Original Report Authenticated By: Janeece Riggers, M.D.   1. Seizures   2. Hypokalemia   3. Noncompliance     MDM  Pt presenting w/ seizure activity. While in the ED patient proceeded to have two more seizures despite receiving Ativan and Keppra loading dose.  Patient not in status epilepticus. Patient will be admitted to tele with [facility] for further management. Patient d/w with Dr. Jeraldine Loots, agrees with plan. The patient appears reasonably stabilized for admission considering the current resources, flow, and capabilities available in the ED at this time, and I doubt any other [facility] requiring further screening and/or treatment in the ED prior to admission.       Jeannetta Ellis, PA-C 10/14/12 2220

## 2012-10-14 NOTE — H&P (Signed)
[facility] History and Physical  SHEIKH LEVERICH WUJ:[id number] DOB: [birth year] DOA: 10/14/2012  Referring physician:  EDP PCP: Provider Not In System  Specialists:   Chief Complaint:  Seizures  HPI: Brandon Blake is a 32 y.o. male with a history of seizures who had 1 unwitnessed seizure at home, and had 2 witnessed seizures in the ED after presentation.    He was admininstered a total of 4mg  IV ativan and loaded with IV Keppra in the ED and referred for admission.  Per his family he does not take his medications regularly and does not regularly followup with his neurologist at [facility].      Review of Systems:   Unable to Obtain from the Patient at this time due to Sedation.     Past Medical History  Diagnosis Date  . Back pain, chronic   . Seizures     Past Surgical History  Procedure Laterality Date  . Cholecystectomy      Prior to Admission medications   Medication Sig Start Date End Date Taking? Authorizing Provider  levETIRAcetam (KEPPRA) 500 MG tablet Take 500 mg by mouth every 12 (twelve) hours.   Yes Historical Provider, MD    Allergies  Allergen Reactions  . Naproxen Hives and Rash    Social History:  reports that he has been smoking Cigarettes.  He has been smoking about 0.75 packs per day. He does not have any smokeless tobacco history on file. He reports that he does not drink alcohol or use illicit drugs.     Family History  Problem Relation Age of Onset  . Cancer Father   . Diabetes Other   . Coronary artery disease Other     (be sure to complete)   Physical Exam:  GEN:  Sedated Well developed  32 y.o. male  examined  and in no acute distress;    Filed Vitals:   10/14/12 1647  BP: 116/73  Pulse: 75  Temp: 99 F (37.2 C)  TempSrc: Oral  Resp: 16  SpO2: 98%   Blood pressure 116/73, pulse 75, temperature 99 F (37.2 C), temperature source Oral, resp. rate 16, SpO2 98.00%. PSYCH: He is  sedated;  HEENT: Normocephalic  and Atraumatic, Mucous membranes pink; PERRLA; EOM intact; Fundi:  Benign;  No scleral icterus, Nares: Patent, Oropharynx: Clear, Fair Dentition, Neck:  FROM, no cervical lymphadenopathy nor thyromegaly or carotid bruit; no JVD; Breasts:: Not examined CHEST WALL: No tenderness CHEST: Normal respiration, clear to auscultation bilaterally HEART: Regular rate and rhythm; no murmurs rubs or gallops BACK: No kyphosis or scoliosis; no CVA tenderness ABDOMEN: Positive Bowel Sounds, Obese, soft non-tender; no masses, no organomegaly. Rectal Exam: Not done EXTREMITIES: No cyanosis, clubbing or edema; no ulcerations. Genitalia: not examined PULSES: 2+ and symmetric SKIN: Normal hydration no rash or ulceration CNS: Cranial nerves 2-12 grossly intact no focal neurologic deficit    Labs on Admission:  Basic Metabolic Panel:  Recent Labs Lab 10/14/12 1829  NA 138  K 3.4*  CL 105  CO2 26  GLUCOSE 99  BUN 8  CREATININE 0.71  CALCIUM 9.0   Liver Function Tests: No results found for this basename: AST, ALT, ALKPHOS, BILITOT, PROT, ALBUMIN,  in the last 168 hours No results found for this basename: LIPASE, AMYLASE,  in the last 168 hours No results found for this basename: AMMONIA,  in the last 168 hours CBC:  Recent Labs Lab 10/14/12 1829  WBC 10.3  NEUTROABS 7.0  HGB 13.8  HCT 39.7  MCV 89.8  PLT 209   Cardiac Enzymes: No results found for this basename: CKTOTAL, CKMB, CKMBINDEX, TROPONINI,  in the last 168 hours  BNP (last 3 results) No results found for this basename: PROBNP,  in the last 8760 hours CBG: No results found for this basename: GLUCAP,  in the last 168 hours  Radiological Exams on Admission: Ct Head Wo Contrast  10/14/2012   *RADIOLOGY REPORT*  Clinical Data:  Seizure.  Fall  CT HEAD WITHOUT CONTRAST CT CERVICAL SPINE WITHOUT CONTRAST  Technique:  Multidetector CT imaging of the head and cervical spine was performed following the standard protocol without  intravenous contrast.  Multiplanar CT image reconstructions of the cervical spine were also generated.  Comparison:  CT 06/20/2012  CT HEAD  Findings: Ventricle size is normal.  Negative for infarct, hemorrhage, or mass lesion.  The brain appears normal.  There is no skull fracture.  Chronic sinusitis is present. There is an air- fluid level in the sphenoid sinus.  IMPRESSION: No significant intracranial abnormality.  Sinusitis with air-fluid level in the sphenoid sinus.  CT CERVICAL SPINE  Findings: Negative for fracture.  Normal alignment.  Mild disc degeneration with early spurring at C4-5.  IMPRESSION: Negative for fracture.   Original Report Authenticated By: Janeece Riggers, M.D.   Ct Cervical Spine Wo Contrast  10/14/2012   *RADIOLOGY REPORT*  Clinical Data:  Seizure.  Fall  CT HEAD WITHOUT CONTRAST CT CERVICAL SPINE WITHOUT CONTRAST  Technique:  Multidetector CT imaging of the head and cervical spine was performed following the standard protocol without intravenous contrast.  Multiplanar CT image reconstructions of the cervical spine were also generated.  Comparison:  CT 06/20/2012  CT HEAD  Findings: Ventricle size is normal.  Negative for infarct, hemorrhage, or mass lesion.  The brain appears normal.  There is no skull fracture.  Chronic sinusitis is present. There is an air- fluid level in the sphenoid sinus.  IMPRESSION: No significant intracranial abnormality.  Sinusitis with air-fluid level in the sphenoid sinus.  CT CERVICAL SPINE  Findings: Negative for fracture.  Normal alignment.  Mild disc degeneration with early spurring at C4-5.  IMPRESSION: Negative for fracture.   Original Report Authenticated By: Janeece Riggers, M.D.      Assessment/Plan Principal Problem:   Seizures Active Problems:   Hypokalemia   Noncompliance   1.   Seizures-  Seizure Precautions, Loaded with IV Keppra,  Continue Keppra 500mg   Q 12, PO when alert, PRN IV Ativan for Seizure Activity.  Check UDS.     2.    Hypokalemia- Replete K+ and Check magnesium level.    3.   Noncompliance- Needs counseling in regard to his diease process and the importance of his medications.        Code Status:    FULL CODE Family Communication:  No Family Present Disposition Plan:    Return to Home on Discharge  Time spent:    60 Minutes  Ron Parker [facility] Pager [phone]  If 7PM-7AM, please contact night-coverage [email or web address] Password [email or web address] 10/14/2012, 8:25 PM

## 2012-10-14 NOTE — ED Notes (Signed)
Patient transported to CT 

## 2012-10-14 NOTE — ED Notes (Signed)
Bed:WA09<BR> Expected date:<BR> Expected time:<BR> Means of arrival:<BR> Comments:<BR> EMS

## 2012-10-14 NOTE — ED Notes (Signed)
4 staff at bedside- removed LSB.  Dr. Silverio Lay requested that C-collar stay on pt.

## 2012-10-14 NOTE — ED Notes (Addendum)
Found pt's eyes rolling, back slightly arching and legs shaking slightly.  JenPA at bedside. Ordered Ativan 2mg  IV

## 2012-10-14 NOTE — ED Notes (Addendum)
Pt was found at the mailbox lying on the ground with hand shaking ,wife said hand was shaking and this is normal for post ictal stage. cbg 113, iv 20g rt hand, pt is currenlty post ictal/ will hold up when asked. Hx of seizures, unknown of what medications he is on. Pt on lsb

## 2012-10-14 NOTE — ED Provider Notes (Signed)
  This was a shared visit with a mid-level provided (NP or PA).  Throughout the patient's course I was available for consultation/collaboration.  I saw the ECG (if appropriate), relevant labs and studies - I agree with the interpretation.  On my exam the patient was seizing.  With his history of medication noncompliance, he required admission for further evaluation and management and initiation of antiseizure medication.      Gerhard Munch, MD 10/14/12 [phone]

## 2012-10-14 NOTE — ED Notes (Addendum)
Rash noted on lower abdomen- PA to bedside

## 2012-10-15 DIAGNOSIS — G40109 Localization-related (focal) (partial) symptomatic epilepsy and epileptic syndromes with simple partial seizures, not intractable, without status epilepticus: Secondary | ICD-10-CM

## 2012-10-15 LAB — BASIC METABOLIC PANEL
BUN: 6 mg/dL (ref 6–23)
Chloride: 104 mEq/L (ref 96–112)
Creatinine, Ser: 0.69 mg/dL (ref 0.50–1.35)
GFR calc Af Amer: >90 mL/min (ref >90)
GFR calc non Af Amer: >90 mL/min (ref >90)
Potassium: 3.3 mEq/L — ABNORMAL LOW (ref 3.5–5.1)

## 2012-10-15 LAB — LEVETIRACETAM LEVEL: Levetiracetam Lvl: 32.5 ug/mL — ABNORMAL HIGH (ref 5.0–30.0)

## 2012-10-15 LAB — CBC
MCHC: 34.3 g/dL (ref 30.0–36.0)
RDW: 12.2 % (ref 11.5–15.5)
WBC: 10.5 10*3/uL (ref 4.0–10.5)

## 2012-10-15 MED ORDER — LEVETIRACETAM 500 MG/5ML IV SOLN
1000.0000 mg | Freq: Once | INTRAVENOUS | Status: AC
  Administered 2012-10-15: 1000 mg via INTRAVENOUS
  Filled 2012-10-15: qty 10

## 2012-10-15 MED ORDER — POTASSIUM CHLORIDE CRYS ER 20 MEQ PO TBCR
40.0000 meq | EXTENDED_RELEASE_TABLET | Freq: Once | ORAL | Status: DC
  Filled 2012-10-15: qty 2
  Filled 2012-10-15: qty 1

## 2012-10-15 MED ORDER — NICOTINE 14 MG/24HR TD PT24
14.0000 mg | MEDICATED_PATCH | Freq: Every day | TRANSDERMAL | Status: DC
  Administered 2012-10-15 – 2012-10-16 (×2): 14 mg via TRANSDERMAL
  Filled 2012-10-15 (×2): qty 1
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
## 2012-10-15 NOTE — Progress Notes (Signed)
Patient is able to rouse briefly now but will fall right back asleep. Able to follow simple commands. Pupils are reactive now. Will continue to monitor. Erskin Burnet RN
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
## 2012-10-15 NOTE — Care Management Note (Signed)
RN notified CM of pt and spouse concerns regarding self-pay admission status. Cm arrived at bedside, pt unable to communicate. Per RN,pt transferring to SDU. Cm spoke with patient's spouse at the bedside concerning self-payor status. CM informed spouse Artist to consult patient prior to discharge and could answer questions concerning Medicaid eligibility. Patient possibly eligible for [facility] program upon discharge. No PCP on record. Please consider follow up with [facility] at discharge.    Leonie Green [phone]

## 2012-10-15 NOTE — Progress Notes (Signed)
Patient had an episode at 0940 this morning. Patient called to ask for pain medication and sat up and stated that his back was hurting something awful and asked for pain medication. Went to go get pain medication and when came back patient was on his left side. His right arm only was twitching. Patient was minimally responsive and on pupil exam eyes were rolling back. Dr. Lenise Arena was walking by and came in to assess patient. After a little while patient started to wake and was able to follow simple commands. Patient then fell back into a restful sleep. Will continue to monitor. Erskin Burnet RN
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
## 2012-10-15 NOTE — Progress Notes (Signed)
Patient had another seizure after walking to the restroom for a bowel movement. He expressed that he started feeling dizzy and by the time he was back in bed his arm was shaking and his level of consciousness became altered. He started speaking to his dead aunt. Notified Dr. Izola Price. Administered 1 mg of ativan IV. Patient then stopped shaking and became very lethargic. Vital signs are stable but patient is difficult to arouse and his eyes are rolling back. Will continue to monitor. Erskin Burnet RN

## 2012-10-15 NOTE — Progress Notes (Signed)
Called down and gave report to step-down RN. Patient will be transferring to 1221. Erskin Burnet RN

## 2012-10-15 NOTE — Progress Notes (Signed)
Patient was able to wake and speak with nurse. Although he was confused, he thought that he was not yet married, that it was the 8th of July, and did not initially remember that he has a history of seizures. Reoriented patient and calmed him. Patient was able to go back to sleep. Closely monitoring patient. Erskin Burnet RN

## 2012-10-15 NOTE — Progress Notes (Signed)
2045 Upon entering room, patient was slumped over the right bedrail, unresponsive to voice or pain.  Pulled patient back into bed in supine position, unresponsive to sternal rub.  Right arm noted to have a slight tremor, and patient's eyelids twitching with eyes rolled back.  Ativan 1mg  IV given per PRN order.  VSS.  [facility] physician notified.  Approximately 10 minutes later, patient became responsive to voice, yet seemed confused.  Follows all commands, speaking softly.  Patient denied being aware of seizure.  Patient also noted to have a red, raised skin rash to groin, upper thighs, buttocks, abdomen, and flank areas.  Resembles scabies infection.  ELink MD notified.  Patient is currently on contact precautions for a positive MRSA swab.  Will continue to monitor.

## 2012-10-15 NOTE — Progress Notes (Signed)
Received pt from ED, Arousable with stimulation and oriented x 3, following commands. No complaints at this time.

## 2012-10-16 LAB — CBC
HCT: 39 % (ref 39.0–52.0)
Hemoglobin: 13.4 g/dL (ref 13.0–17.0)
MCH: 30.9 pg (ref 26.0–34.0)
MCV: 89.9 fL (ref 78.0–100.0)
Platelets: 184 10*3/uL (ref 150–400)
RBC: 4.34 MIL/uL (ref 4.22–5.81)
WBC: 8 10*3/uL (ref 4.0–10.5)

## 2012-10-16 LAB — BASIC METABOLIC PANEL
BUN: 9 mg/dL (ref 6–23)
CO2: 30 mEq/L (ref 19–32)
Chloride: 102 mEq/L (ref 96–112)
Creatinine, Ser: 0.74 mg/dL (ref 0.50–1.35)
Glucose, Bld: 104 mg/dL — ABNORMAL HIGH (ref 70–99)

## 2012-10-16 MED ORDER — POTASSIUM CHLORIDE CRYS ER 20 MEQ PO TBCR: 40.0000 meq | EXTENDED_RELEASE_TABLET | Freq: Every day | ORAL | Status: DC

## 2012-10-16 MED ORDER — OXYCODONE HCL 5 MG PO TABS: 5.0000 mg | ORAL_TABLET | ORAL | Status: DC | PRN

## 2012-10-16 MED ORDER — POTASSIUM CHLORIDE CRYS ER 20 MEQ PO TBCR
40.0000 meq | EXTENDED_RELEASE_TABLET | Freq: Two times a day (BID) | ORAL | Status: DC
  Administered 2012-10-16: 40 meq via ORAL
  Filled 2012-10-16: qty 1

## 2012-10-16 MED ORDER — NICOTINE 14 MG/24HR TD PT24: 1.0000 | MEDICATED_PATCH | Freq: Every day | TRANSDERMAL | Status: DC

## 2012-10-16 NOTE — Progress Notes (Signed)
Brandon Blake 1229 WAS D/C'D HOME- GIVEN D/C INSTRUCTIONS WITH UNDERSTANDING. HIS CONDITION WAS STABLE.
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
## 2012-10-16 NOTE — Progress Notes (Signed)
Called into room by patient, reports he did not "feel good".  Reports a headache, backache, and dizziness.  Patient slow to respond, eyes closed.  VSS.  Ativan 1mg  IV given per PRN order.  Patient briefly unresponsive to painful stimuli (~30-45 seconds).  Responsive to voice at this time, soft speech, slow to respond, denies dizziness.  Will continue to monitor.

## 2012-10-20 ENCOUNTER — Encounter (HOSPITAL_COMMUNITY): Payer: Self-pay | Admitting: Emergency Medicine

## 2012-10-20 ENCOUNTER — Inpatient Hospital Stay (HOSPITAL_COMMUNITY)
Admission: EM | Admit: 2012-10-20 | Discharge: 2012-10-22 | DRG: 101 | Disposition: A | Discharge status: Home / Self Care | Payer: Medicaid Other | Attending: Internal Medicine | Admitting: Internal Medicine

## 2012-10-20 ENCOUNTER — Emergency Department (HOSPITAL_COMMUNITY): Payer: Medicaid Other

## 2012-10-20 DIAGNOSIS — M545 Low back pain, unspecified: Secondary | ICD-10-CM | POA: Diagnosis present

## 2012-10-20 DIAGNOSIS — R569 Unspecified convulsions: Secondary | ICD-10-CM

## 2012-10-20 DIAGNOSIS — F1210 Cannabis abuse, uncomplicated: Secondary | ICD-10-CM

## 2012-10-20 DIAGNOSIS — F329 Major depressive disorder, single episode, unspecified: Secondary | ICD-10-CM

## 2012-10-20 DIAGNOSIS — R51 Headache: Secondary | ICD-10-CM | POA: Diagnosis present

## 2012-10-20 DIAGNOSIS — Z9119 Patient's noncompliance with other medical treatment and regimen: Secondary | ICD-10-CM

## 2012-10-20 DIAGNOSIS — Z833 Family history of diabetes mellitus: Secondary | ICD-10-CM

## 2012-10-20 DIAGNOSIS — Z8249 Family history of ischemic heart disease and other diseases of the circulatory system: Secondary | ICD-10-CM

## 2012-10-20 DIAGNOSIS — F32A Depression, unspecified: Secondary | ICD-10-CM

## 2012-10-20 DIAGNOSIS — Z91199 Patient's noncompliance with other medical treatment and regimen due to unspecified reason: Secondary | ICD-10-CM

## 2012-10-20 DIAGNOSIS — F411 Generalized anxiety disorder: Secondary | ICD-10-CM | POA: Diagnosis present

## 2012-10-20 DIAGNOSIS — G8929 Other chronic pain: Secondary | ICD-10-CM

## 2012-10-20 DIAGNOSIS — G40401 Other generalized epilepsy and epileptic syndromes, not intractable, with status epilepticus: Principal | ICD-10-CM | POA: Diagnosis present

## 2012-10-20 DIAGNOSIS — D72829 Elevated white blood cell count, unspecified: Secondary | ICD-10-CM | POA: Diagnosis present

## 2012-10-20 DIAGNOSIS — F17200 Nicotine dependence, unspecified, uncomplicated: Secondary | ICD-10-CM | POA: Diagnosis present

## 2012-10-20 DIAGNOSIS — Z720 Tobacco use: Secondary | ICD-10-CM

## 2012-10-20 DIAGNOSIS — E876 Hypokalemia: Secondary | ICD-10-CM

## 2012-10-20 DIAGNOSIS — G40901 Epilepsy, unspecified, not intractable, with status epilepticus: Secondary | ICD-10-CM

## 2012-10-20 DIAGNOSIS — M549 Dorsalgia, unspecified: Secondary | ICD-10-CM | POA: Diagnosis present

## 2012-10-20 DIAGNOSIS — G40109 Localization-related (focal) (partial) symptomatic epilepsy and epileptic syndromes with simple partial seizures, not intractable, without status epilepticus: Secondary | ICD-10-CM

## 2012-10-20 LAB — RAPID URINE DRUG SCREEN, HOSP PERFORMED
Benzodiazepines: NOT DETECTED
Opiates: NOT DETECTED

## 2012-10-20 LAB — COMPREHENSIVE METABOLIC PANEL
ALT: 27 U/L (ref 0–53)
AST: 29 U/L (ref 0–37)
Albumin: 3.8 g/dL (ref 3.5–5.2)
Alkaline Phosphatase: 72 U/L (ref 39–117)
BUN: 10 mg/dL (ref 6–23)
CO2: 27 mEq/L (ref 19–32)
Calcium: 9.4 mg/dL (ref 8.4–10.5)
Chloride: 103 mEq/L (ref 96–112)
Creatinine, Ser: 0.71 mg/dL (ref 0.50–1.35)
GFR calc Af Amer: >90 mL/min (ref >90)
GFR calc non Af Amer: >90 mL/min (ref >90)
Glucose, Bld: 96 mg/dL (ref 70–99)
Potassium: 3.8 mEq/L (ref 3.5–5.1)
Sodium: 138 mEq/L (ref 135–145)
Total Bilirubin: 0.2 mg/dL — ABNORMAL LOW (ref 0.3–1.2)
Total Protein: 7.5 g/dL (ref 6.0–8.3)

## 2012-10-20 LAB — CBC WITH DIFFERENTIAL/PLATELET
Basophils Relative: 0 % (ref 0–1)
Eosinophils Absolute: 0.1 10*3/uL (ref 0.0–0.7)
Eosinophils Relative: 1 % (ref 0–5)
HCT: 43.7 % (ref 39.0–52.0)
Hemoglobin: 15.3 g/dL (ref 13.0–17.0)
Lymphs Abs: 2.3 10*3/uL (ref 0.7–4.0)
MCH: 31.5 pg (ref 26.0–34.0)
MCHC: 35 g/dL (ref 30.0–36.0)
MCV: 89.9 fL (ref 78.0–100.0)
Monocytes Absolute: 0.7 10*3/uL (ref 0.1–1.0)
Monocytes Relative: 6 % (ref 3–12)
RBC: 4.86 MIL/uL (ref 4.22–5.81)

## 2012-10-20 LAB — URINALYSIS, ROUTINE W REFLEX MICROSCOPIC
Glucose, UA: NEGATIVE mg/dL
Ketones, ur: NEGATIVE mg/dL
Leukocytes, UA: NEGATIVE
Nitrite: NEGATIVE
Protein, ur: NEGATIVE mg/dL
pH: 7 (ref 5.0–8.0)

## 2012-10-20 LAB — GLUCOSE, CAPILLARY: Glucose-Capillary: 95 mg/dL (ref 70–99)

## 2012-10-20 LAB — ETHANOL: Alcohol, Ethyl (B): <11 mg/dL (ref 0–11)

## 2012-10-20 MED ORDER — SODIUM CHLORIDE 0.9 % IV SOLN
500.0000 mg | Freq: Two times a day (BID) | INTRAVENOUS | Status: DC
  Administered 2012-10-20 – 2012-10-21 (×2): 500 mg via INTRAVENOUS
  Filled 2012-10-20 (×2): qty 5

## 2012-10-20 MED ORDER — SODIUM CHLORIDE 0.9 % IV SOLN
INTRAVENOUS | Status: DC
  Administered 2012-10-20 – 2012-10-21 (×3): via INTRAVENOUS

## 2012-10-20 MED ORDER — SODIUM CHLORIDE 0.9 % IV SOLN
1000.0000 mg | Freq: Once | INTRAVENOUS | Status: AC
  Administered 2012-10-20: 1000 mg via INTRAVENOUS
  Filled 2012-10-20: qty 20

## 2012-10-20 MED ORDER — LORAZEPAM 2 MG/ML IJ SOLN
2.0000 mg | Freq: Once | INTRAMUSCULAR | Status: AC
  Administered 2012-10-20: 2 mg via INTRAVENOUS
  Filled 2012-10-20: qty 1

## 2012-10-20 MED ORDER — MORPHINE SULFATE 2 MG/ML IJ SOLN
1.0000 mg | INTRAMUSCULAR | Status: DC | PRN
  Administered 2012-10-20: 1 mg via INTRAVENOUS
  Filled 2012-10-20: qty 1

## 2012-10-20 MED ORDER — LORAZEPAM 2 MG/ML IJ SOLN
1.0000 mg | Freq: Once | INTRAMUSCULAR | Status: DC
  Filled 2012-10-20: qty 1

## 2012-10-20 MED ORDER — SODIUM CHLORIDE 0.9 % IV SOLN: 1000.0000 mg | Freq: Once | INTRAVENOUS | Status: DC

## 2012-10-20 MED ORDER — PHENYTOIN SODIUM 50 MG/ML IJ SOLN: 1000.0000 mg | Freq: Once | INTRAMUSCULAR | Status: DC

## 2012-10-20 MED ORDER — NICOTINE 14 MG/24HR TD PT24
14.0000 mg | MEDICATED_PATCH | Freq: Every day | TRANSDERMAL | Status: DC
  Administered 2012-10-20 – 2012-10-22 (×3): 14 mg via TRANSDERMAL
  Filled 2012-10-20 (×3): qty 1

## 2012-10-20 MED ORDER — LORAZEPAM 2 MG/ML IJ SOLN
2.0000 mg | Freq: Once | INTRAMUSCULAR | Status: DC
  Filled 2012-10-20: qty 1

## 2012-10-20 MED ORDER — ACETAMINOPHEN 325 MG PO TABS: 650.0000 mg | ORAL_TABLET | Freq: Four times a day (QID) | ORAL | Status: DC | PRN

## 2012-10-20 MED ORDER — ACETAMINOPHEN 650 MG RE SUPP: 650.0000 mg | Freq: Four times a day (QID) | RECTAL | Status: DC | PRN

## 2012-10-20 MED ORDER — MORPHINE SULFATE 2 MG/ML IJ SOLN
2.0000 mg | INTRAMUSCULAR | Status: DC | PRN
  Administered 2012-10-21 – 2012-10-22 (×6): 2 mg via INTRAVENOUS
  Filled 2012-10-20 (×6): qty 1

## 2012-10-20 MED ORDER — ONDANSETRON HCL 4 MG PO TABS: 4.0000 mg | ORAL_TABLET | Freq: Four times a day (QID) | ORAL | Status: DC | PRN

## 2012-10-20 MED ORDER — LORAZEPAM 2 MG/ML IJ SOLN
1.0000 mg | INTRAMUSCULAR | Status: DC | PRN
  Administered 2012-10-20 (×2): 1 mg via INTRAVENOUS
  Filled 2012-10-20 (×2): qty 1

## 2012-10-20 MED ORDER — ONDANSETRON HCL 4 MG/2ML IJ SOLN: 4.0000 mg | Freq: Four times a day (QID) | INTRAMUSCULAR | Status: DC | PRN

## 2012-10-20 MED ORDER — POTASSIUM CHLORIDE CRYS ER 20 MEQ PO TBCR
40.0000 meq | EXTENDED_RELEASE_TABLET | Freq: Every day | ORAL | Status: DC
  Administered 2012-10-21 – 2012-10-22 (×2): 40 meq via ORAL
  Filled 2012-10-20 (×2): qty 2

## 2012-10-20 MED ORDER — HEPARIN SODIUM (PORCINE) 5000 UNIT/ML IJ SOLN
5000.0000 [IU] | Freq: Three times a day (TID) | INTRAMUSCULAR | Status: DC
  Administered 2012-10-20 – 2012-10-22 (×5): 5000 [IU] via SUBCUTANEOUS
  Filled 2012-10-20 (×9): qty 1

## 2012-10-20 NOTE — ED Notes (Signed)
Pt has hx of seizures for past year.  Seen 7/11 for same.  Seizure today was witnessed lasted 5 minutes, no incontince or oral trauma noted. Pt on keppra.  Family states pt layed down prior to seizure.  20 ga IV started by ems.  Pt post ictal.

## 2012-10-20 NOTE — Progress Notes (Signed)
EDCM spoke to patient's wife Brandon Blake at bedside regarding insurance coverage.  Patient currently does not have insurance coverage.  [facility] provided patient with information on how to apply fro Medicaid, Adult Care Act and also the orange card.  Explained to patient's wife that the orange card is not an insurance but hopefully it will help bridge him until he gets insurance.  Patient's wife verbalized undestanding.  No further needs at this time.

## 2012-10-20 NOTE — ED Notes (Signed)
PA at bedside.

## 2012-10-20 NOTE — ED Provider Notes (Signed)
History    CSN: [id number] Arrival date & time 10/20/12  1321  First MD Initiated Contact with Patient 10/20/12 1408     Chief Complaint  Patient presents with  . Seizures   (Consider location/radiation/quality/duration/timing/severity/associated sxs/prior Treatment) HPI 32 year old male presents via EMS for seizure.  He has a past medical history of seizures in the past year.  The patient was admitted for seizure over the past weekend due to noncompliance of medications and discharged this past Sunday.  There is a level V cavity is patient is in status epilepticus.  History is given by his wife.  Patient came in from work today complaining of severe headache and chest pain.  She states that he was on the couch and to please call the ambulance, laid himself down on the floor and proceeded to go into tonic-clonic seizure.  She states that this lasted about 5 minutes until the ambulance came.  He was given 1 mg of Ativan but never woke up from his seizure.  She states he has had twitching of his right arm intermittently since his seizure and has not woken up at all.  She states the patient has been taking his medication as prescribed.  She states that he has not reported any recent illness.  Past Medical History  Diagnosis Date  . Back pain, chronic   . Seizures    Past Surgical History  Procedure Laterality Date  . Cholecystectomy     Family History  Problem Relation Age of Onset  . Cancer Father   . Diabetes Other   . Coronary artery disease Other    History  Substance Use Topics  . Smoking status: Current Every Day Smoker -- 0.75 packs/day    Types: Cigarettes  . Smokeless tobacco: Not on file  . Alcohol Use: No    Review of Systems  Unable to obtain ROS secondary to patient's mental status Allergies  Naproxen  Home Medications   Current Outpatient Rx  Name  Route  Sig  Dispense  Refill  . levETIRAcetam (KEPPRA) 500 MG tablet   Oral   Take 500 mg by mouth 2 (two)  times daily.          . potassium chloride SA (K-DUR,KLOR-CON) 20 MEQ tablet   Oral   Take 2 tablets (40 mEq total) by mouth daily.   10 tablet   0    BP 115/77  Pulse 67  Temp(Src) 98.4 F (36.9 C) (Oral)  Resp 17  SpO2 97% Physical Exam  Nursing note and vitals reviewed. Constitutional: He appears well-developed and well-nourished.  HENT:  Head: Normocephalic and atraumatic.  Eyes: Conjunctivae are normal.  Eyes are rolled back, not tracking , rapid movments  Neck: Normal range of motion.  Cardiovascular: Normal rate.   Pulmonary/Chest: Effort normal. No respiratory distress.  ronchi  Abdominal: Soft. He exhibits no distension.  Musculoskeletal: Normal range of motion.  Neurological:  Patient in active focal seizure, twitching of R arm, occasional facial twitching.    ED Course  Procedures (including critical care time) Labs Reviewed  CBC WITH DIFFERENTIAL - Abnormal; Notable for the following:    WBC 10.9 (*)    Neutro Abs 7.9 (*)    All other components within normal limits  GLUCOSE, CAPILLARY  COMPREHENSIVE METABOLIC PANEL  URINALYSIS, ROUTINE W REFLEX MICROSCOPIC   Dg Chest Port 1 View  10/20/2012   *RADIOLOGY REPORT*  Clinical Data: Seizure.  Productive cough.  PORTABLE CHEST - 1 VIEW 10/20/2012 1522  hours:  Comparison: Two-view chest x-ray 06/16/2012, 05/05/2010, 12/03/2008.  Findings: Suboptimal inspiration accounts for crowded bronchovascular markings, especially in the lung bases, and accentuates the cardiac silhouette.  Taking this into account, cardiomediastinal silhouette unremarkable and lungs clear.  IMPRESSION: Suboptimal inspiration.  No acute cardiopulmonary disease.   Original Report Authenticated By: Hulan Saas, M.D.   1. Status epilepticus     MDM  4:21 PM BP 115/77  Pulse 67  Temp(Src) 98.4 F (36.9 C) (Oral)  Resp 17  SpO2 97% Patient in status epilepticus for 1 hour, Given multple rounds of ativan.  I spoke with Dr. Leroy Kennedy who  has asked to give 1 gm dilantin.  i spent 30 minutes in the room with the patient in status giving IV ativan.  Patient  Labs show leukocytosis. No other abnormalities.  4:50 PM I have spoken with Dr. Gwenlyn Perking who will admit the patient as a transfer to [facility]. Dr. Leroy Kennedy is aware and will consult on the patient.  Arthor Captain, PA-C 10/20/12 1653

## 2012-10-20 NOTE — ED Notes (Signed)
MD at bedside. 

## 2012-10-20 NOTE — ED Notes (Signed)
WUJ:[id number]<JY> Expected date:<BR> Expected time:<BR> Means of arrival:<BR> Comments:<BR> ems- 32 yo M, seizure

## 2012-10-20 NOTE — Consult Note (Signed)
NEURO HOSPITALIST CONSULT NOTE    Reason for Consult:status epilepticus.  HPI:                                                                                                                                          Brandon Blake is an 32 y.o. 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 Upon arrival to ED he had a cluster of seizures without regaining consciousness and therefore was considered as having GTC SE. Received total of 2 mg IV ativan and 1 gram IV dilantin and seizures stopped. He said that the seizures are more frequent lately, which he attributes to increase stress in his life and denies missing his daily dose of keppra. Complains of head and low back pain but denies vertigo, double vision, focal weakness, slurred speech, language or vision impairment. No recent fever or infection. No history of febrile seizures, severe head trauma, CNS infection, or stroke. No family history of epilepsy. Normal development.     Past Medical History  Diagnosis Date  . Back pain, chronic   . Seizures     Past Surgical History  Procedure Laterality Date  . Cholecystectomy      Family History  Problem Relation Age of Onset  . Cancer Father   . Diabetes Other   . Coronary artery disease Other       Social History:  reports that he has been smoking Cigarettes.  He has been smoking about 0.75 packs per day. He does not have any smokeless tobacco history on file. He reports that he does not drink alcohol or use illicit drugs.  Allergies  Allergen Reactions  . Naproxen Hives and Rash    Swelling of hands     MEDICATIONS:                                                                                                                     I have reviewed the patient's current medications.   ROS:  History obtained from the patient and chart review.  General ROS: negative for - chills, fatigue, fever, night sweats, weight gain or weight loss Psychological ROS: negative for - behavioral disorder, hallucinations, memory difficulties, or suicidal ideation Ophthalmic ROS: negative for - blurry vision, double vision, eye pain or loss of vision ENT ROS: negative for - epistaxis, nasal discharge, oral lesions, sore throat, tinnitus or vertigo Allergy and Immunology ROS: negative for - hives or itchy/watery eyes Hematological and Lymphatic ROS: negative for - bleeding problems, bruising or swollen lymph nodes Endocrine ROS: negative for - galactorrhea, hair pattern changes, polydipsia/polyuria or temperature intolerance Respiratory ROS: negative for - cough, hemoptysis, shortness of breath or wheezing Cardiovascular ROS: negative for - chest pain, dyspnea on exertion, edema or irregular heartbeat Gastrointestinal ROS: negative for - abdominal pain, diarrhea, hematemesis, nausea/vomiting or stool incontinence Genito-Urinary ROS: negative for - dysuria, hematuria, incontinence or urinary frequency/urgency Musculoskeletal ROS: negative for - joint swelling or muscular weakness Neurological ROS: as noted in HPI Dermatological ROS: negative for rash and skin lesion changes   Physical exam: pleasant male in no apparent distress.Blood pressure 110/70, pulse 81, temperature 98.4 F (36.9 C), temperature source Oral, resp. rate 19, SpO2 96.00%. Head: normocephalic. Neck: supple, no bruits, no JVD. Cardiac: no murmurs. Lungs: clear. Abdomen: soft, no tender, no mass. Extremities: no edema.     Neurologic Examination:                                                                                                      Mental Status: Alert, awake, oriented x 4, thought content appropriate.  Comprehension, naming, and repetition intact. Speech fluent without evidence of aphasia.  Able to follow 3 step commands without difficulty. Cranial Nerves: II: Discs flat bilaterally; Visual fields grossly normal, pupils equal, round, reactive to light and accommodation III,IV, VI: ptosis not present, extra-ocular motions intact bilaterally V,VII: smile symmetric, facial light touch sensation normal bilaterally VIII: hearing normal bilaterally IX,X: gag reflex present XI: bilateral shoulder shrug XII: midline tongue extension Motor: Right : Upper extremity   5/5    Left:     Upper extremity   5/5  Lower extremity   5/5     Lower extremity   5/5 Tone and bulk:normal tone throughout; no atrophy noted Sensory: Pinprick and light touch intact throughout, bilaterally Deep Tendon Reflexes:  5/5 all over  Plantars: Right: downgoing   Left: downgoing Cerebellar: normal finger-to-nose,  normal heel-to-shin test Gait: No tested CV: pulses palpable throughout    No results found for this basename: cbc, bmp, coags, chol, tri, ldl, hga1c    Results for orders placed during the hospital encounter of 10/20/12 (from the past 48 hour(s))  GLUCOSE, CAPILLARY     Status: None   Collection Time    10/20/12  1:25 PM      Result Value Range   Glucose-Capillary 95  70 - 99 mg/dL  CBC WITH DIFFERENTIAL     Status: Abnormal   Collection Time    10/20/12  3:12 PM      Result Value Range  WBC 10.9 (*) 4.0 - 10.5 K/uL   RBC 4.86  4.22 - 5.81 MIL/uL   Hemoglobin 15.3  13.0 - 17.0 g/dL   HCT 78.2  95.6 - 21.3 %   MCV 89.9  78.0 - 100.0 fL   MCH 31.5  26.0 - 34.0 pg   MCHC 35.0  30.0 - 36.0 g/dL   RDW 08.6  57.8 - 46.9 %   Platelets 210  150 - 400 K/uL   Neutrophils Relative % 72  43 - 77 %   Neutro Abs 7.9 (*) 1.7 - 7.7 K/uL   Lymphocytes Relative 21  12 - 46 %   Lymphs Abs 2.3  0.7 - 4.0 K/uL   Monocytes Relative 6  3 - 12 %   Monocytes Absolute 0.7  0.1 - 1.0 K/uL   Eosinophils Relative 1   0 - 5 %   Eosinophils Absolute 0.1  0.0 - 0.7 K/uL   Basophils Relative 0  0 - 1 %   Basophils Absolute 0.0  0.0 - 0.1 K/uL  COMPREHENSIVE METABOLIC PANEL     Status: Abnormal   Collection Time    10/20/12  3:12 PM      Result Value Range   Sodium 138  135 - 145 mEq/L   Potassium 3.8  3.5 - 5.1 mEq/L   Chloride 103  96 - 112 mEq/L   CO2 27  19 - 32 mEq/L   Glucose, Bld 96  70 - 99 mg/dL   BUN 10  6 - 23 mg/dL   Creatinine, Ser 6.29  0.50 - 1.35 mg/dL   Calcium 9.4  8.4 - 52.8 mg/dL   Total Protein 7.5  6.0 - 8.3 g/dL   Albumin 3.8  3.5 - 5.2 g/dL   AST 29  0 - 37 U/L   ALT 27  0 - 53 U/L   Alkaline Phosphatase 72  39 - 117 U/L   Total Bilirubin 0.2 (*) 0.3 - 1.2 mg/dL   GFR calc non Af Amer >90  >90 mL/min   GFR calc Af Amer >90  >90 mL/min   Comment:            The eGFR has been calculated     using the CKD EPI equation.     This calculation has not been     validated in all clinical     situations.     eGFR's persistently     <90 mL/min signify     possible Chronic Kidney Disease.  ETHANOL     Status: None   Collection Time    10/20/12  5:01 PM      Result Value Range   Alcohol, Ethyl (B) <11  0 - 11 mg/dL   Comment:            LOWEST DETECTABLE LIMIT FOR     SERUM ALCOHOL IS 11 mg/dL     FOR MEDICAL PURPOSES ONLY  URINALYSIS, ROUTINE W REFLEX MICROSCOPIC     Status: None   Collection Time    10/20/12  5:08 PM      Result Value Range   Color, Urine YELLOW  YELLOW   APPearance CLEAR  CLEAR   Specific Gravity, Urine 1.021  1.005 - 1.030   pH 7.0  5.0 - 8.0   Glucose, UA NEGATIVE  NEGATIVE mg/dL   Hgb urine dipstick NEGATIVE  NEGATIVE   Bilirubin Urine NEGATIVE  NEGATIVE   Ketones, ur NEGATIVE  NEGATIVE mg/dL  Protein, ur NEGATIVE  NEGATIVE mg/dL   Urobilinogen, UA 0.2  0.0 - 1.0 mg/dL   Nitrite NEGATIVE  NEGATIVE   Leukocytes, UA NEGATIVE  NEGATIVE   Comment: MICROSCOPIC NOT DONE ON URINES WITH NEGATIVE PROTEIN, BLOOD, LEUKOCYTES, NITRITE, OR GLUCOSE <1000  mg/dL.  URINE RAPID DRUG SCREEN (HOSP PERFORMED)     Status: Abnormal   Collection Time    10/20/12  5:08 PM      Result Value Range   Opiates NONE DETECTED  NONE DETECTED   Cocaine NONE DETECTED  NONE DETECTED   Benzodiazepines NONE DETECTED  NONE DETECTED   Amphetamines NONE DETECTED  NONE DETECTED   Tetrahydrocannabinol POSITIVE (*) NONE DETECTED   Barbiturates NONE DETECTED  NONE DETECTED   Comment:            DRUG SCREEN FOR MEDICAL PURPOSES     ONLY.  IF CONFIRMATION IS NEEDED     FOR ANY PURPOSE, NOTIFY LAB     WITHIN 5 DAYS.                LOWEST DETECTABLE LIMITS     FOR URINE DRUG SCREEN     Drug Class       Cutoff (ng/mL)     Amphetamine      1000     Barbiturate      200     Benzodiazepine   200     Tricyclics       300     Opiates          300     Cocaine          300     THC              50    Dg Chest Port 1 View  10/20/2012   *RADIOLOGY REPORT*  Clinical Data: Seizure.  Productive cough.  PORTABLE CHEST - 1 VIEW 10/20/2012 1522 hours:  Comparison: Two-view chest x-ray 06/16/2012, 05/05/2010, 12/03/2008.  Findings: Suboptimal inspiration accounts for crowded bronchovascular markings, especially in the lung bases, and accentuates the cardiac silhouette.  Taking this into account, cardiomediastinal silhouette unremarkable and lungs clear.  IMPRESSION: Suboptimal inspiration.  No acute cardiopulmonary disease.   Original Report Authenticated By: Hulan Saas, M.D.     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 Patient is now back to baseline. Continue keppra. Will follow up.   Wyatt Portela, MD [facility] [phone]  10/20/2012, 6:53 PM

## 2012-10-20 NOTE — ED Notes (Signed)
Family states pt's R arm shakes and he becomes lethargic when he has a seizure. Pt's r arm shook for 10 seconds while rn in room. Pt lethargic

## 2012-10-20 NOTE — ED Notes (Addendum)
Pt complains of headache 

## 2012-10-20 NOTE — H&P (Signed)
[facility] History and Physical  Brandon Blake ZOX:[id number] DOB: 1980/09/08 DOA: 10/20/2012  Referring physician: Arthor Captain, PA PCP: Provider Not In System   Chief Complaint: seizure activity  HPI: Brandon Blake is a 32 y.o. male with hx of seizure disorder, medication non-compliance, tobacco abuse and marijuana abuse; came to ED by EMS after experiencing seizure activity at home. Patient unable to provide much of the history currently. Per wife reports he return from work complaining of Ha and some back pain, layed down  On the couch and become unresponsive with subsequent tonic-clonic activity; patient's wife called EMS, at their arrival seizure was still present and per records he spent approx 45 minutes of intermittent seizure despite ativan administration. Patient able to maintain airway and good O2 sat between seizure events. Per neurology recommendations dilantin load was given, which stop the seizure. At this point will continue IV keppra and will admit patient to [facility] for continue EEG and medication adjustments.  Review of Systems:  Unable to assess due to post-ictal state. But otherwise negative as retrieved by family members communication except as mentioned on HPI.  Past Medical History  Diagnosis Date  . Back pain, chronic   . Seizures    Past Surgical History  Procedure Laterality Date  . Cholecystectomy     Social History:  reports that he has been smoking Cigarettes.  He has been smoking about 0.75 packs per day. He does not have any smokeless tobacco history on file. He reports that he does not drink alcohol or use illicit drugs.  Allergies  Allergen Reactions  . Naproxen Hives and Rash    Swelling of hands     Family History  Problem Relation Age of Onset  . Cancer Father   . Diabetes Other   . Coronary artery disease Other     Prior to Admission medications   Medication Sig Start Date End Date Taking? Authorizing Provider   levETIRAcetam (KEPPRA) 500 MG tablet Take 500 mg by mouth 2 (two) times daily.    Yes Historical Provider, MD  potassium chloride SA (K-DUR,KLOR-CON) 20 MEQ tablet Take 2 tablets (40 mEq total) by mouth daily. 10/16/12  Yes Dorothea Ogle, MD   Physical Exam: Filed Vitals:   10/20/12 1400 10/20/12 1415 10/20/12 1500 10/20/12 1640  BP: 115/77  112/73 110/70  Pulse: 67  67 81  Temp:      TempSrc:      Resp: 17  0 19  SpO2: 96% 97% 97% 96%    General:lethargic, confuse, afebrile, unable to fully follow commands. HEENT: Normocephalic and Atraumatic, Mucous membranes pink; PERRLA; EOM intact; Fundi: Benign; No scleral icterus, Nares: Patent, Oropharynx: Clear, Fair Dentition, Neck: FROM, no cervical lymphadenopathy nor thyromegaly or carotid bruit; no JVD;  CHEST WALL: no tenderness on palpation LUNGS: Normal respiration, clear to auscultation bilaterally  HEART: Regular rate and rhythm; no murmurs rubs or gallops  ABDOMEN: Positive Bowel Sounds, Obese, soft non-tender; no masses, no organomegaly.  EXTREMITIES: No cyanosis, clubbing or edema; no ulcerations.  PULSES: 2+ and symmetric  SKIN: Normal skin turgor, no bruises, no rash, no petechiae or ulceration  CNS: patient moving 4 limbs spontaneously, CN grossly intact; lethargic and confused on exam (post-ictal most likely); gait was not assess.  Labs on Admission:  Basic Metabolic Panel:  Recent Labs Lab 10/14/12 1829 10/15/12 0503 10/16/12 0356 10/20/12 1512  NA 138 138 138 138  K 3.4* 3.3* 3.1* 3.8  CL 105 104 102 103  CO2 26 28 30 27   GLUCOSE 99 112* 104* 96  BUN 8 6 9 10   CREATININE 0.71 0.69 0.74 0.71  CALCIUM 9.0 8.9 9.0 9.4  MG 1.8  --   --   --    Liver Function Tests:  Recent Labs Lab 10/20/12 1512  AST 29  ALT 27  ALKPHOS 72  BILITOT 0.2*  PROT 7.5  ALBUMIN 3.8   CBC:  Recent Labs Lab 10/14/12 1829 10/15/12 0503 10/16/12 0356 10/20/12 1512  WBC 10.3 10.5 8.0 10.9*  NEUTROABS 7.0  --   --  7.9*   HGB 13.8 13.8 13.4 15.3  HCT 39.7 40.2 39.0 43.7  MCV 89.8 89.9 89.9 89.9  PLT 209 181 184 210   CBG:  Recent Labs Lab 10/20/12 1325  GLUCAP 95    Radiological Exams on Admission: Dg Chest Port 1 View  10/20/2012   *RADIOLOGY REPORT*  Clinical Data: Seizure.  Productive cough.  PORTABLE CHEST - 1 VIEW 10/20/2012 1522 hours:  Comparison: Two-view chest x-ray 06/16/2012, 05/05/2010, 12/03/2008.  Findings: Suboptimal inspiration accounts for crowded bronchovascular markings, especially in the lung bases, and accentuates the cardiac silhouette.  Taking this into account, cardiomediastinal silhouette unremarkable and lungs clear.  IMPRESSION: Suboptimal inspiration.  No acute cardiopulmonary disease.   Original Report Authenticated By: Hulan Saas, M.D.    Assessment/Plan 1-Status epilepticus: hx of seizure disease, with medication non-compliance and recurrent seizures. -since patient spent almost an hour on continuous status epilepticus and is his second back to back admission due to seizure activity (this after apparently been compliant with medications); [facility] monitoring and further adjustment to his antiepileptic regimen. -currently has been loaded with dilantin -will continue keppra IV (with intention to transition to PO once no longer lethargic and tolerating diet) -seizure precaution and PRN ativan -will check UA,UDS and ETOH -CXR w/o infiltrates  2-Seizures: treatment as mentioned above. Patient lethargic and confuse due to postictal state. Maintaining/protecting airways currently.  3-Hypokalemia: K 3.8 currently. Will continue potassium maintenance treatment once able to tolerate PO meds.  4-Chronic back pain:chronic. Will use PRN analgesia.  5-Leukocytosis: no signs or source for infection, most likely demargination from seizure. Repeat CBC in am to follow WBC trend  6-Tobacco abuse and marijuana abuse: counseling  will be needed once postictal state is resolved. Will use nicotine patch.  DVT: heparin   Neurohospitalist (Dr. Leroy Kennedy)  Code Status: Full Family Communication: wife at bedside Disposition Plan: inpatient; stepdown; LOS > 2 midnights  Time spent: 50 minutes  Dorita Rowlands [facility] Pager [phone]  If 7PM-7AM, please contact night-coverage [email or web address] Password [facility] 10/20/2012, 5:06 PM

## 2012-10-21 ENCOUNTER — Inpatient Hospital Stay (HOSPITAL_COMMUNITY): Payer: Medicaid Other

## 2012-10-21 DIAGNOSIS — F17200 Nicotine dependence, unspecified, uncomplicated: Secondary | ICD-10-CM

## 2012-10-21 DIAGNOSIS — E876 Hypokalemia: Secondary | ICD-10-CM

## 2012-10-21 DIAGNOSIS — G40109 Localization-related (focal) (partial) symptomatic epilepsy and epileptic syndromes with simple partial seizures, not intractable, without status epilepticus: Secondary | ICD-10-CM

## 2012-10-21 LAB — BASIC METABOLIC PANEL
CO2: 29 mEq/L (ref 19–32)
Chloride: 103 mEq/L (ref 96–112)
Creatinine, Ser: 0.68 mg/dL (ref 0.50–1.35)
Glucose, Bld: 101 mg/dL — ABNORMAL HIGH (ref 70–99)

## 2012-10-21 MED ORDER — LEVETIRACETAM 500 MG PO TABS
500.0000 mg | ORAL_TABLET | Freq: Two times a day (BID) | ORAL | Status: DC
  Administered 2012-10-21 – 2012-10-22 (×3): 500 mg via ORAL
  Filled 2012-10-21 (×5): qty 1

## 2012-10-21 NOTE — Progress Notes (Signed)
On initial assessment this am pt found with right hand shaking, not responding to bedside RN. Dr. Gwenlyn Perking aware. Pt became alert and able to talk on phone with wife a few minutes later. Neuro assessment done and found as charted - normal. No Ativan given at this time.

## 2012-10-21 NOTE — Progress Notes (Signed)
   CARE MANAGEMENT NOTE 10/21/2012  Patient:  Brandon Blake, Brandon Blake   Account Number:  [id number]  Date Initiated:  10/21/2012  Documentation initiated by:  Jiles Crocker  Subjective/Objective Assessment:   ADMITTED WITH SEIZURES     Action/Plan:   LIVES WITH SPOUSE, CONTINUES TO WORK; SEE NOTE BELOW   Anticipated DC Date:  10/24/2012   Anticipated DC Plan:  HOME/SELF CARE  In-house referral  Financial Counselor      DC Planning Services  CM consult          Status of service:  In process, will continue to follow Medicare Important Message given?  NA - LOS <3 / Initial given by admissions (If response is "NO", the following Medicare IM given date fields will be blank)  Per UR Regulation:  Reviewed for med. necessity/level of care/duration of stay  Comments:  10/21/2012- Patient was seen by the ER CM - EDCM spoke to patient's wife Clairise at bedside regarding insurance coverage.  Patient currently does not have insurance coverage.  [facility] provided patient with information on how to apply fro Medicaid, Adult Care Act and also the orange card.  Explained to patient's wife that the orange card is not an insurance but hopefully it will help bridge him until he gets insurance.  Patient's wife verbalized undestanding.  No further needs at this time. Abelino Derrick RN,BSN,MHA [phone]

## 2012-10-21 NOTE — Procedures (Signed)
EEG report.  Brief clinical history: 32 years old male with a history of GTC seizures for few years, non adherent to treatment. Patient is on keppra 500 mg BID. Technique: this is a 17 channel prolonged scalp EEG with video  performed at the bedside with bipolar and monopolar montages arranged in accordance to the international 10/20 system of electrode placement. One channel was dedicated to EKG recording.  The study was performed during wakefulness and drowsiness. Intermittent photic stimulation was utilized as activating procedure.  Description:In the wakeful state, the best background consisted of a medium amplitude, posterior dominant, well sustained, symmetric and reactive 10 Hz rhythm. Drowsiness demonstrated dropout of the alpha rharges. At the beginning of intermittent photic the technician tells the patient that light stimulation can induce a seizure and few seconds later he becomes unresponsive and his arm start shaking. He remains unresponsive, eyes open, and then is " in and out of seizures" but capable to talked to the technician. This scenario goes on for few minutes and there is not associated EEG correlate throughout the entire episode. Family witnessed this prolonged event and started that this is his habitual seizure.  No focal or generalized epileptiform discharges noted. No slowing seen. EKG showed sinus rhythm.  Impression: this is a normal prolonged  EEG with video monitoring in which patient sustained one of his habitual seizures and there was not EEG correlate. The interictal EEG is normal and patient paroxysmal event is consistent with a non epileptic seizure. Clinical correlation is advised.  Wyatt Portela, MD

## 2012-10-21 NOTE — Progress Notes (Signed)
[facility] PROGRESS NOTE  Brandon Blake:[id number] DOB: 1981-03-04 DOA: 10/20/2012 PCP: Provider Not In System  Assessment/Plan: 1-Status epilepticus: hx of seizure disease, with medication non-compliance and recurrent seizures.  -status epilepticus now resolved and patient neurologically back to baseline -will transfer to [facility] for continuous EEG -patient loaded with dilantin on 10/20/12 -per neurology recommendations will continue keppra at current dose -seizure precaution and PRN ativan  -UDS positive for marijuana, no signs of infection in his urine and ETOH < 11 -CXR w/o infiltrates  -patient afebrile  2-Seizures: treatment as mentioned above. Patient with just focal shaking of right hand overnight, but able to talk and communicate while having this episode (?? Pseudo-seizure). -will transfer to [facility] for continuous EEG -continue Keppra  3-Hypokalemia: K 3.4 currently. Will continue potassium maintenance treatment. -will repeat BMET and Mg in am  4-Chronic back pain: chronic. Will continue PRN analgesia.   5-Leukocytosis: no signs or source for infection, most likely demargination from seizure. Repeat CBC in am to follow WBC trend. Patient remains afebrile.  6-Tobacco abuse and marijuana abuse: counseling provided. Will continue nicotine patch.   7-Left aside headache: will check CT head as recommended by neurology. No focal deficit and most likely secondary to seizure.  DVT: heparin  Code Status: Full Family Communication: no family at bedside Disposition Plan: will transfer to tele bed at [facility] for continuous EEG   Consultants:  neurology  Procedures:  See below for x-ray reports  Antibiotics:  none  HPI/Subjective: Afebrile, complaining of HA (left side) and back pain (chronic)  Objective: Filed Vitals:   10/21/12 0432 10/21/12 0600 10/21/12 0630 10/21/12 0800  BP: 114/67 80/47 107/72   Pulse: 68 57 62   Temp:    97.9 F (36.6 C)  TempSrc:     Oral  Resp:      Height:      Weight:      SpO2: 96% 96% 97%     Intake/Output Summary (Last 24 hours) at 10/21/12 0835 Last data filed at 10/21/12 0636  Gross per 24 hour  Intake    950 ml  Output    500 ml  Net    450 ml   Filed Weights   10/20/12 2125  Weight: 115.9 kg (255 lb 8.2 oz)    Exam:   General:  AAOx3, no focal deficit, complaining of left side headache and back pain  Cardiovascular: S1 and S2, no rubs or gallops, no murmur  Respiratory: CTA bilaterally  Abdomen: soft, NT, ND, positive BS  Musculoskeletal: no edema, no cyanosis or clubbing  Neuro: CN intact, no nystagmus appreciated; tongue midline, EOMI; ms 4/5 right upper arm due to poor effort, rest of MS evaluation 5/5 bilaterally.  Data Reviewed: Basic Metabolic Panel:  Recent Labs Lab 10/14/12 1829 10/15/12 0503 10/16/12 0356 10/20/12 1512 10/21/12 0347  NA 138 138 138 138 137  K 3.4* 3.3* 3.1* 3.8 3.4*  CL 105 104 102 103 103  CO2 26 28 30 27 29   GLUCOSE 99 112* 104* 96 101*  BUN 8 6 9 10 8   CREATININE 0.71 0.69 0.74 0.71 0.68  CALCIUM 9.0 8.9 9.0 9.4 9.0  MG 1.8  --   --   --   --    Liver Function Tests:  Recent Labs Lab 10/20/12 1512  AST 29  ALT 27  ALKPHOS 72  BILITOT 0.2*  PROT 7.5  ALBUMIN 3.8   CBC:  Recent Labs Lab 10/14/12 1829 10/15/12 0503 10/16/12 0356  10/20/12 1512  WBC 10.3 10.5 8.0 10.9*  NEUTROABS 7.0  --   --  7.9*  HGB 13.8 13.8 13.4 15.3  HCT 39.7 40.2 39.0 43.7  MCV 89.8 89.9 89.9 89.9  PLT 209 181 184 210   CBG:  Recent Labs Lab 10/20/12 1325  GLUCAP 95    Recent Results (from the past 240 hour(s))  MRSA PCR SCREENING     Status: Abnormal   Collection Time    10/15/12  1:10 PM      Result Value Range Status   MRSA by PCR POSITIVE (*) NEGATIVE Final   Comment:            The GeneXpert MRSA Assay (FDA     approved for NASAL specimens     only), is one component of a     comprehensive MRSA colonization     surveillance  program. It is not     intended to diagnose MRSA     infection nor to guide or     monitor treatment for     MRSA infections.     RESULT CALLED TO, READ BACK BY AND VERIFIED WITH:     DENISE AT 1514 ON [id number] BY C.BONGEL     Studies: Dg Chest Port 1 View  10/20/2012   *RADIOLOGY REPORT*  Clinical Data: Seizure.  Productive cough.  PORTABLE CHEST - 1 VIEW 10/20/2012 1522 hours:  Comparison: Two-view chest x-ray 06/16/2012, 05/05/2010, 12/03/2008.  Findings: Suboptimal inspiration accounts for crowded bronchovascular markings, especially in the lung bases, and accentuates the cardiac silhouette.  Taking this into account, cardiomediastinal silhouette unremarkable and lungs clear.  IMPRESSION: Suboptimal inspiration.  No acute cardiopulmonary disease.   Original Report Authenticated By: Hulan Saas, M.D.    Scheduled Meds: . heparin  5,000 Units Subcutaneous Q8H  . levETIRAcetam  500 mg Oral BID  . nicotine  14 mg Transdermal Daily  . potassium chloride SA  40 mEq Oral Daily   Continuous Infusions: . sodium chloride 75 mL/hr at 10/21/12 1610    Principal Problem:   Status epilepticus Active Problems:   Seizures   Hypokalemia   Chronic back pain   Leukocytosis    Time spent: >30 minutes    Brandon Blake  [facility] Pager [phone]. If 7PM-7AM, please contact night-coverage at [email or web address], password [facility] 10/21/2012, 8:35 AM  LOS: 1 day

## 2012-10-21 NOTE — ED Provider Notes (Signed)
CRITICAL CARE Performed by: Donnetta Hutching Total critical care time: 30 Critical care time was exclusive of separately billable procedures and treating other patients. Critical care was necessary to treat or prevent imminent or life-threatening deterioration. Critical care was time spent personally by me on the following activities: development of treatment plan with patient and/or surrogate as well as nursing, discussions with consultants, evaluation of patient's response to treatment, examination of patient, obtaining history from patient or surrogate, ordering and performing treatments and interventions, ordering and review of laboratory studies, ordering and review of radiographic studies, pulse oximetry and re-evaluation of patient's condition.   Patient is in status epilepticus.   Rx IV Ativan and IV Dilantin.   Seizures have abated. Discussed with neurologist. Transfer to [facility] cone.  Donnetta Hutching, MD 10/21/12 1023

## 2012-10-21 NOTE — Progress Notes (Addendum)
NEURO HOSPITALIST PROGRESS NOTE   SUBJECTIVE:                                                                                                                        Patient at this time is stable only complaining of mild HA and some back pain that is chronic.   OBJECTIVE:                                                                                                                           Vital signs in last 24 hours: Temp:  [97.9 F (36.6 C)-98.7 F (37.1 C)] 98.7 F (37.1 C) (07/18 1131) Pulse Rate:  [57-81] 73 (07/18 1131) Resp:  [13-23] 16 (07/18 1131) BP: (80-152)/(47-88) 125/87 mmHg (07/18 1131) SpO2:  [96 %-100 %] 98 % (07/18 1131) Weight:  [115.9 kg (255 lb 8.2 oz)-117.981 kg (260 lb 1.6 oz)] 117.981 kg (260 lb 1.6 oz) (07/18 1131)  Intake/Output from previous day: 07/17 0701 - 07/18 0700 In: 950 [I.V.:750; IV Piggyback:200] Out: 500 [Urine:500] Intake/Output this shift: Total I/O In: 465 [P.O.:240; I.V.:225] Out: 450 [Urine:450] Nutritional status: General  Past Medical History  Diagnosis Date  . Back pain, chronic   . Seizures       Neurologic Exam:  Mental Status: Alert, oriented, thought content appropriate.  Speech fluent without evidence of aphasia.  Able to follow 3 step commands without difficulty. Cranial Nerves: II: Discs flat bilaterally; Visual fields grossly normal, pupils equal, round, reactive to light and accommodation III,IV, VI: ptosis not present, extra-ocular motions intact bilaterally V,VII: smile symmetric, facial light touch sensation normal bilaterally VIII: hearing normal bilaterally IX,X: gag reflex present XI: bilateral shoulder shrug XII: midline tongue extension --no neck pain or meningismus Motor: Right : Upper extremity   5/5 (poor effort)  Left:     Upper extremity   5/5  Lower extremity   5/5     Lower extremity   5/5 Tone and bulk:normal tone throughout; no atrophy noted Sensory:  Pinprick and light touch intact throughout, bilaterally Deep Tendon Reflexes:  Right: Upper Extremity   Left: Upper extremity   biceps (C-5 to C-6) 2/4   biceps (C-5 to C-6) 2/4 tricep (C7) 2/4    triceps (  C7) 2/4 Brachioradialis (C6) 2/4  Brachioradialis (C6) 2/4  Lower Extremity Lower Extremity  quadriceps (L-2 to L-4) 2/4   quadriceps (L-2 to L-4) 2/4 Achilles (S1) 2/4   Achilles (S1) 2/4  Plantars: Right: downgoing   Left: downgoing Cerebellar: normal finger-to-nose,  normal heel-to-shin test CV: pulses palpable throughout    Lab Results: No components found with this basename: cbc,  bmp,  coags,  chol,  tri,  ldl,  hga1c   Lipid Panel No results found for this basename: CHOL, TRIG, HDL, CHOLHDL, VLDL, LDLCALC,  in the last 72 hours  Studies/Results: Ct Head Wo Contrast  10/21/2012   *RADIOLOGY REPORT*  Clinical Data: Headache with right-sided weakness.  CT HEAD WITHOUT CONTRAST  Technique:  Contiguous axial images were obtained from the base of the skull through the vertex without contrast.  Comparison: 10/14/2012  Findings: There is no evidence for acute hemorrhage, hydrocephalus, mass lesion, or abnormal extra-axial fluid collection.  No definite CT evidence for acute infarction.  Chronic left maxillary sinus disease with remaining paranasal sinuses unremarkable.  The left sphenoid sinus air-fluid levels seen previously has resolved in the interval.  IMPRESSION: Stable intracranial exam.  No acute findings.  Interval resolution of air-fluid level in the left sphenoid sinus.   Original Report Authenticated By: Kennith Center, M.D.   Dg Chest Port 1 View  10/20/2012   *RADIOLOGY REPORT*  Clinical Data: Seizure.  Productive cough.  PORTABLE CHEST - 1 VIEW 10/20/2012 1522 hours:  Comparison: Two-view chest x-ray 06/16/2012, 05/05/2010, 12/03/2008.  Findings: Suboptimal inspiration accounts for crowded bronchovascular markings, especially in the lung bases, and accentuates the cardiac  silhouette.  Taking this into account, cardiomediastinal silhouette unremarkable and lungs clear.  IMPRESSION: Suboptimal inspiration.  No acute cardiopulmonary disease.   Original Report Authenticated By: Hulan Saas, M.D.    MEDICATIONS                                                                                                                        Scheduled: . heparin  5,000 Units Subcutaneous Q8H  . levETIRAcetam  500 mg Oral BID  . nicotine  14 mg Transdermal Daily  . potassium chloride SA  40 mEq Oral Daily    ASSESSMENT/PLAN:                                                                                                             32 YO male with known seizure disorder (GTC) who is on Keppra. Patient has history of poor adherence to AED.  Currently he is stable.  There is question of nonepileptic activity thus plan is being transfer patient to [facility] and place on prolonged EEG with hopes to capture spell.   Recommend: 1) Continue Keppra  2) Prolonged EEG captured event which showed no epileptiform activity.  Due to non epileptic seizure captured would not recommend changing patients Keppra dose. Prolonged EEG will be terminated.   3) Patient will need to follow up out patient with neurology. No further neurological recommendations.     --Patient has appointment on 11/09/12 at 10 AM with dr Marjory Lies.    Neurology will sign off.    Assessment and plan discussed with with attending physician and they are in agreement.    Felicie Morn PA-C [facility] [phone]  10/21/2012, 3:29 PM

## 2012-10-21 NOTE — Progress Notes (Signed)
LTVM up and running   

## 2012-10-22 DIAGNOSIS — F329 Major depressive disorder, single episode, unspecified: Secondary | ICD-10-CM

## 2012-10-22 DIAGNOSIS — R569 Unspecified convulsions: Secondary | ICD-10-CM

## 2012-10-22 DIAGNOSIS — F411 Generalized anxiety disorder: Secondary | ICD-10-CM

## 2012-10-22 LAB — MAGNESIUM: Magnesium: 1.8 mg/dL (ref 1.5–2.5)

## 2012-10-22 LAB — CBC
MCH: 31.8 pg (ref 26.0–34.0)
MCHC: 35 g/dL (ref 30.0–36.0)
Platelets: 181 10*3/uL (ref 150–400)
RBC: 4.53 MIL/uL (ref 4.22–5.81)

## 2012-10-22 LAB — BASIC METABOLIC PANEL
CO2: 27 mEq/L (ref 19–32)
Calcium: 9.4 mg/dL (ref 8.4–10.5)
GFR calc non Af Amer: >90 mL/min (ref >90)
Sodium: 141 mEq/L (ref 135–145)

## 2012-10-22 MED ORDER — CHLORHEXIDINE GLUCONATE CLOTH 2 % EX PADS
6.0000 | MEDICATED_PAD | Freq: Every day | CUTANEOUS | Status: DC
  Administered 2012-10-22: 6 via TOPICAL

## 2012-10-22 MED ORDER — OXYCODONE-ACETAMINOPHEN 5-325 MG PO TABS
1.0000 | ORAL_TABLET | Freq: Four times a day (QID) | ORAL | Status: DC | PRN
  Administered 2012-10-22: 2 via ORAL
  Filled 2012-10-22: qty 2

## 2012-10-22 MED ORDER — MUPIROCIN 2 % EX OINT
1.0000 "application " | TOPICAL_OINTMENT | Freq: Two times a day (BID) | CUTANEOUS | Status: DC
  Administered 2012-10-22: 1 via NASAL
  Filled 2012-10-22: qty 22

## 2012-10-22 MED ORDER — OXYCODONE-ACETAMINOPHEN 5-325 MG PO TABS: 1.0000 | ORAL_TABLET | Freq: Four times a day (QID) | ORAL | Status: DC | PRN

## 2012-10-22 NOTE — Consult Note (Signed)
Patient Identification:  Brandon Blake Date of Evaluation:  10/22/2012   History of Present Illness: Patient is a 32 year old male with a history of seizure disorder, noncompliance of medication who came to ED by EMS after experiencing seizure activity at home.  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 Patient adds that he had multiple stressors which include working long hours, having a young child, not liking his job and having had multiple visits to the ER along with hospitalizations for his seizures.  Patient states that he feels overwhelmed, frustrated with his situation but is not having any thoughts of harming himself or others. He adds that he loves his daughter and wants to get help that he needs to get better. On a scale of 0-10, with 0 being no symptoms and 10 being the worst, patient reports that his depression currently is a 4/10. He also reports that he's anxious as he does not understand what is wrong with him. He adds that he just wants to get better. He denies any other complaints at this visit. His wife agrees and feels that he should see a therapist and possibly a psychiatrist on discharge from the hospital   Past Psychiatric History: Patient is history of having had behavioral problems including self mutilating behaviors when he was a child, adds that has not seen a psychiatrist or a therapist for many years now. Patient states that he's not on any psychotropic medication.    Past Medical History:     Past Medical History  Diagnosis Date  . Back pain, chronic   . Seizures        Past Surgical History  Procedure Laterality Date  . Cholecystectomy      Allergies:  Allergies  Allergen Reactions  . Naproxen Hives and Rash    Swelling of hands     Current Medications:  Prior to Admission medications   Medication Sig Start Date End Date Taking? Authorizing Provider  levETIRAcetam (KEPPRA) 500 MG  tablet Take 500 mg by mouth 2 (two) times daily.    Yes Historical Provider, MD  potassium chloride SA (K-DUR,KLOR-CON) 20 MEQ tablet Take 2 tablets (40 mEq total) by mouth daily. 10/16/12  Yes Dorothea Ogle, MD  oxyCODONE-acetaminophen (PERCOCET/ROXICET) 5-325 MG per tablet Take 1-2 tablets by mouth every 6 (six) hours as needed. 10/22/12   Pamella Pert, MD    Social History:    reports that he has been smoking Cigarettes.  He has been smoking about 0.75 packs per day. He does not have any smokeless tobacco history on file. He reports that he does not drink alcohol or use illicit drugs.   Family History:    Family History  Problem Relation Age of Onset  . Cancer Father   . Diabetes Other   . Coronary artery disease Other     Mental Status Examination/Evaluation: Patient was dressed in hospital clothes, is alert and oriented x3. He reported his mood as upset and frustrated. His affect was congruent with his mood. His thought content had no suicidal ideation, no homicidal ideation no delusions or paranoia. He denied any perceptual problems. His thought processes are organized and goal directed. His insight into his illness fluctuates between fair to poor and so does his judgment   DIAGNOSIS:   AXIS I   major depressive disorder, anxiety disorder NOS   AXIS II  Deffered  AXIS III See medical notes.  AXIS IV Medical  problems, stress with job  AXIS V 51-60 moderate symptoms     Assessment/Plan: Patient would benefit from seeing a psychiatrist and a therapist outpatient as he struggling with the diagnosis of pseudoseizures. Patient needs followup made for [facility] for both seeing a psychiatrist and also a therapist Patient has not required any inpatient psychiatric care Thank you for the consultation

## 2012-10-22 NOTE — Discharge Summary (Signed)
Physician Discharge Summary  Brandon Blake ZOX:[id number] DOB: 07-07-1980 DOA: 10/20/2012  PCP: Provider Not In System  Admit date: 10/20/2012 Discharge date: 10/22/2012  Time spent: 35 minutes  Recommendations for Outpatient Follow-up:  1. Establish and follow up with PCP in 1 week post discharge 2. Follow up with psychiatry as an outpatient 3. Follow up with Neurology as scheduled  Discharge Diagnoses:  Principal Problem:   Status epilepticus Active Problems:   Seizures   Hypokalemia   Chronic back pain   Leukocytosis  Discharge Condition: stable  Diet recommendation: regular  Filed Weights   10/20/12 2125 10/21/12 1131  Weight: 115.9 kg (255 lb 8.2 oz) 117.981 kg (260 lb 1.6 oz)   History of present illness:  Brandon Blake is a 32 y.o. male with hx of seizure disorder, medication non-compliance, tobacco abuse and marijuana abuse; came to ED by EMS after experiencing seizure activity at home. Patient unable to provide much of the history currently. Per wife reports he return from work complaining of Ha and some back pain, layed down On the couch and become unresponsive with subsequent tonic-clonic activity; patient's wife called EMS, at their arrival seizure was still present and per records he spent approx 45 minutes of intermittent seizure despite ativan administration. Patient able to maintain airway and good O2 sat between seizure events. Per neurology recommendations dilantin load was given, which stop the seizure. At this point will continue IV keppra and will admit patient to [facility] for continue EEG and medication adjustments.  Hospital Course:  Seizure-like episodes - patient initially admitted at [facility] then transferred to [facility] for continuous EEG. Neurology followed patient, continuous EEG was done 7/18 and patient while monitored had a witnessed seizure event which per family was his typical seizures. EEG negative for focal or generalized  epileptiform discharges. It was felt that this was a good evalution for his seizures and continuous EEG has been discontinued and he is to continue his Keppra per neurology recommendations.  Depression - patient endorsed being depressed; wife was concerned that he recently stated that "things would be much better without me" and "I am very tired of being in the hospital all the time". Patient was with a sitter overnight on 7/18 and a formal psychiatry consult was placed. The morning of discharge patient looked quite happy and in much better spirits as he was playing with his kid and stated that "last night I just felt down". Denies being depressed now, denies suicidal or homicidal ideation and just "wants to go home and enjoy life". Wife was present during my conversation and expressed no concerns. Psychiatry evaluated patient as well prior to discharge and recommended outpatient psychiatry follow up and have asked for SW help prior to discharge.   Procedures:  none   Consultations:  Psychiatry  Neurology  Discharge Exam: Filed Vitals:   10/21/12 0800 10/21/12 1131 10/21/12 2030 10/22/12 0537  BP: 97/55 125/87 127/82 104/66  Pulse: 64 73 65 66  Temp: 97.9 F (36.6 C) 98.7 F (37.1 C) 97.4 F (36.3 C) 98.3 F (36.8 C)  TempSrc: Oral  Axillary Oral  Resp: 13 16 16 16   Height:  6' (1.829 m)    Weight:  117.981 kg (260 lb 1.6 oz)    SpO2: 96% 98% 93% 96%   General: NAD Cardiovascular: RRR Respiratory: CTA biL  Discharge Instructions   Future Appointments Provider Department Dept Phone   11/09/2012 10:30 AM Suanne Marker, MD [facility] [phone]  Medication List         levETIRAcetam 500 MG tablet  Commonly known as:  KEPPRA  Take 500 mg by mouth 2 (two) times daily.     oxyCODONE-acetaminophen 5-325 MG per tablet  Commonly known as:  PERCOCET/ROXICET  Take 1-2 tablets by mouth every 6 (six) hours as needed.     potassium chloride SA 20 MEQ  tablet  Commonly known as:  K-DUR,KLOR-CON  Take 2 tablets (40 mEq total) by mouth daily.           Follow-up Information   Follow up with Joycelyn Schmid, MD On 11/09/2012. (10 am )    Contact information:   [address] [place] [address] [phone]       The results of significant diagnostics from this hospitalization (including imaging, microbiology, ancillary and laboratory) are listed below for reference.    Significant Diagnostic Studies: Ct Head Wo Contrast  10/21/2012   *RADIOLOGY REPORT*  Clinical Data: Headache with right-sided weakness.  CT HEAD WITHOUT CONTRAST  Technique:  Contiguous axial images were obtained from the base of the skull through the vertex without contrast.  Comparison: 10/14/2012  Findings: There is no evidence for acute hemorrhage, hydrocephalus, mass lesion, or abnormal extra-axial fluid collection.  No definite CT evidence for acute infarction.  Chronic left maxillary sinus disease with remaining paranasal sinuses unremarkable.  The left sphenoid sinus air-fluid levels seen previously has resolved in the interval.  IMPRESSION: Stable intracranial exam.  No acute findings.  Interval resolution of air-fluid level in the left sphenoid sinus.   Original Report Authenticated By: Kennith Center, M.D.   Ct Head Wo Contrast  10/14/2012   *RADIOLOGY REPORT*  Clinical Data:  Seizure.  Fall  CT HEAD WITHOUT CONTRAST CT CERVICAL SPINE WITHOUT CONTRAST  Technique:  Multidetector CT imaging of the head and cervical spine was performed following the standard protocol without intravenous contrast.  Multiplanar CT image reconstructions of the cervical spine were also generated.  Comparison:  CT 06/20/2012  CT HEAD  Findings: Ventricle size is normal.  Negative for infarct, hemorrhage, or mass lesion.  The brain appears normal.  There is no skull fracture.  Chronic sinusitis is present. There is an air- fluid level in the sphenoid sinus.  IMPRESSION: No  significant intracranial abnormality.  Sinusitis with air-fluid level in the sphenoid sinus.  CT CERVICAL SPINE  Findings: Negative for fracture.  Normal alignment.  Mild disc degeneration with early spurring at C4-5.  IMPRESSION: Negative for fracture.   Original Report Authenticated By: Janeece Riggers, M.D.   Ct Cervical Spine Wo Contrast  10/14/2012   *RADIOLOGY REPORT*  Clinical Data:  Seizure.  Fall  CT HEAD WITHOUT CONTRAST CT CERVICAL SPINE WITHOUT CONTRAST  Technique:  Multidetector CT imaging of the head and cervical spine was performed following the standard protocol without intravenous contrast.  Multiplanar CT image reconstructions of the cervical spine were also generated.  Comparison:  CT 06/20/2012  CT HEAD  Findings: Ventricle size is normal.  Negative for infarct, hemorrhage, or mass lesion.  The brain appears normal.  There is no skull fracture.  Chronic sinusitis is present. There is an air- fluid level in the sphenoid sinus.  IMPRESSION: No significant intracranial abnormality.  Sinusitis with air-fluid level in the sphenoid sinus.  CT CERVICAL SPINE  Findings: Negative for fracture.  Normal alignment.  Mild disc degeneration with early spurring at C4-5.  IMPRESSION: Negative for fracture.   Original Report Authenticated By: Janeece Riggers, M.D.  Dg Chest Port 1 View  10/20/2012   *RADIOLOGY REPORT*  Clinical Data: Seizure.  Productive cough.  PORTABLE CHEST - 1 VIEW 10/20/2012 1522 hours:  Comparison: Two-view chest x-ray 06/16/2012, 05/05/2010, 12/03/2008.  Findings: Suboptimal inspiration accounts for crowded bronchovascular markings, especially in the lung bases, and accentuates the cardiac silhouette.  Taking this into account, cardiomediastinal silhouette unremarkable and lungs clear.  IMPRESSION: Suboptimal inspiration.  No acute cardiopulmonary disease.   Original Report Authenticated By: Hulan Saas, M.D.    Microbiology: Recent Results (from the past 240 hour(s))  MRSA PCR  SCREENING     Status: Abnormal   Collection Time    10/15/12  1:10 PM      Result Value Range Status   MRSA by PCR POSITIVE (*) NEGATIVE Final   Comment:            The GeneXpert MRSA Assay (FDA     approved for NASAL specimens     only), is one component of a     comprehensive MRSA colonization     surveillance program. It is not     intended to diagnose MRSA     infection nor to guide or     monitor treatment for     MRSA infections.     RESULT CALLED TO, READ BACK BY AND VERIFIED WITH:     DENISE AT 1514 ON [id number] BY C.BONGEL     Labs: Basic Metabolic Panel:  Recent Labs Lab 10/16/12 0356 10/20/12 1512 10/21/12 0347 10/22/12 0641  NA 138 138 137 141  K 3.1* 3.8 3.4* 3.7  CL 102 103 103 106  CO2 30 27 29 27   GLUCOSE 104* 96 101* 92  BUN 9 10 8 15   CREATININE 0.74 0.71 0.68 0.93  CALCIUM 9.0 9.4 9.0 9.4  MG  --   --   --  1.8   Liver Function Tests:  Recent Labs Lab 10/20/12 1512  AST 29  ALT 27  ALKPHOS 72  BILITOT 0.2*  PROT 7.5  ALBUMIN 3.8   CBC:  Recent Labs Lab 10/16/12 0356 10/20/12 1512 10/22/12 0641  WBC 8.0 10.9* 8.6  NEUTROABS  --  7.9*  --   HGB 13.4 15.3 14.4  HCT 39.0 43.7 41.2  MCV 89.9 89.9 90.9  PLT 184 210 181   CBG:  Recent Labs Lab 10/20/12 1325  GLUCAP 95   Signed:  Paraskevi Funez  [facility] 10/22/2012, 2:39 PM

## 2012-10-22 NOTE — Progress Notes (Signed)
Suicide precautions will be d/c per verbal telephone order by Dr. Elvera Lennox.  Neurology will be paged so that pt and family can have their questions answered.

## 2012-11-09 ENCOUNTER — Ambulatory Visit: Payer: Self-pay | Admitting: Diagnostic Neuroimaging

## 2012-11-16 ENCOUNTER — Encounter (HOSPITAL_COMMUNITY): Payer: Self-pay | Admitting: Emergency Medicine

## 2012-11-16 ENCOUNTER — Inpatient Hospital Stay (HOSPITAL_COMMUNITY)
Admission: EM | Admit: 2012-11-16 | Discharge: 2012-11-19 | DRG: 100 | Disposition: A | Discharge status: Home / Self Care | Payer: Medicaid Other | Attending: Internal Medicine | Admitting: Internal Medicine

## 2012-11-16 DIAGNOSIS — J690 Pneumonitis due to inhalation of food and vomit: Secondary | ICD-10-CM

## 2012-11-16 DIAGNOSIS — D72829 Elevated white blood cell count, unspecified: Secondary | ICD-10-CM

## 2012-11-16 DIAGNOSIS — Z9119 Patient's noncompliance with other medical treatment and regimen: Secondary | ICD-10-CM

## 2012-11-16 DIAGNOSIS — J9601 Acute respiratory failure with hypoxia: Secondary | ICD-10-CM

## 2012-11-16 DIAGNOSIS — A4902 Methicillin resistant Staphylococcus aureus infection, unspecified site: Secondary | ICD-10-CM | POA: Diagnosis present

## 2012-11-16 DIAGNOSIS — G40401 Other generalized epilepsy and epileptic syndromes, not intractable, with status epilepticus: Principal | ICD-10-CM | POA: Diagnosis present

## 2012-11-16 DIAGNOSIS — G40901 Epilepsy, unspecified, not intractable, with status epilepticus: Secondary | ICD-10-CM | POA: Diagnosis present

## 2012-11-16 DIAGNOSIS — F17200 Nicotine dependence, unspecified, uncomplicated: Secondary | ICD-10-CM | POA: Diagnosis present

## 2012-11-16 DIAGNOSIS — Z886 Allergy status to analgesic agent status: Secondary | ICD-10-CM

## 2012-11-16 DIAGNOSIS — Z91199 Patient's noncompliance with other medical treatment and regimen due to unspecified reason: Secondary | ICD-10-CM

## 2012-11-16 DIAGNOSIS — E875 Hyperkalemia: Secondary | ICD-10-CM | POA: Diagnosis present

## 2012-11-16 DIAGNOSIS — J9819 Other pulmonary collapse: Secondary | ICD-10-CM | POA: Diagnosis present

## 2012-11-16 DIAGNOSIS — J9600 Acute respiratory failure, unspecified whether with hypoxia or hypercapnia: Secondary | ICD-10-CM | POA: Diagnosis present

## 2012-11-16 DIAGNOSIS — R569 Unspecified convulsions: Secondary | ICD-10-CM

## 2012-11-16 DIAGNOSIS — E876 Hypokalemia: Secondary | ICD-10-CM

## 2012-11-16 LAB — GLUCOSE, CAPILLARY: Glucose-Capillary: 110 mg/dL — ABNORMAL HIGH (ref 70–99)

## 2012-11-16 MED ORDER — LORAZEPAM 2 MG/ML IJ SOLN
1.0000 mg | Freq: Once | INTRAMUSCULAR | Status: AC
  Administered 2012-11-16: 1 mg via INTRAVENOUS
  Filled 2012-11-16: qty 1

## 2012-11-16 MED ORDER — SODIUM CHLORIDE 0.9 % IV SOLN
Freq: Once | INTRAVENOUS | Status: AC
  Administered 2012-11-16: via INTRAVENOUS

## 2012-11-16 NOTE — ED Notes (Signed)
Bed: WA09 Expected date: 11/16/12 Expected time: 11:06 PM Means of arrival: Ambulance Comments: 32 yo M seizures

## 2012-11-16 NOTE — ED Notes (Signed)
Brought in by EMS from home after pt's wife found him lying down in the grass.  Per EMS, pt's wife assumed that pt "has had a seizure", pt has hx of seizures and he acted similarly when he had this kind of episode-- pt's wife reported that he has not been feeling well, "not acting right" for the past few days.  Pt A/Ox4 on EMS' arrival at the scene.

## 2012-11-17 ENCOUNTER — Emergency Department (HOSPITAL_COMMUNITY): Payer: Medicaid Other

## 2012-11-17 ENCOUNTER — Inpatient Hospital Stay (HOSPITAL_COMMUNITY): Payer: Medicaid Other

## 2012-11-17 ENCOUNTER — Encounter (HOSPITAL_COMMUNITY): Payer: Self-pay

## 2012-11-17 DIAGNOSIS — J690 Pneumonitis due to inhalation of food and vomit: Secondary | ICD-10-CM | POA: Diagnosis present

## 2012-11-17 DIAGNOSIS — G40401 Other generalized epilepsy and epileptic syndromes, not intractable, with status epilepticus: Principal | ICD-10-CM

## 2012-11-17 DIAGNOSIS — J9601 Acute respiratory failure with hypoxia: Secondary | ICD-10-CM | POA: Diagnosis present

## 2012-11-17 DIAGNOSIS — J9600 Acute respiratory failure, unspecified whether with hypoxia or hypercapnia: Secondary | ICD-10-CM

## 2012-11-17 LAB — CBC
MCV: 90.2 fL (ref 78.0–100.0)
Platelets: 171 10*3/uL (ref 150–400)
Platelets: 202 10*3/uL (ref 150–400)
RBC: 4.38 MIL/uL (ref 4.22–5.81)
RDW: 12.2 % (ref 11.5–15.5)
RDW: 12.5 % (ref 11.5–15.5)
WBC: 10.9 10*3/uL — ABNORMAL HIGH (ref 4.0–10.5)
WBC: 11.5 10*3/uL — ABNORMAL HIGH (ref 4.0–10.5)

## 2012-11-17 LAB — RAPID URINE DRUG SCREEN, HOSP PERFORMED
Cocaine: NOT DETECTED
Opiates: NOT DETECTED

## 2012-11-17 LAB — BASIC METABOLIC PANEL
Chloride: 104 mEq/L (ref 96–112)
GFR calc Af Amer: >90 mL/min (ref >90)
Potassium: 3.7 mEq/L (ref 3.5–5.1)
Sodium: 137 mEq/L (ref 135–145)

## 2012-11-17 LAB — CREATININE, SERUM
GFR calc Af Amer: >90 mL/min (ref >90)
GFR calc non Af Amer: >90 mL/min (ref >90)

## 2012-11-17 LAB — BLOOD GAS, ARTERIAL
Bicarbonate: 25.6 mEq/L — ABNORMAL HIGH (ref 20.0–24.0)
Patient temperature: 98.6
TCO2: 23 mmol/L (ref 0–100)
pH, Arterial: 7.315 — ABNORMAL LOW (ref 7.350–7.450)
pO2, Arterial: 334 mmHg — ABNORMAL HIGH (ref 80.0–100.0)

## 2012-11-17 LAB — POCT I-STAT 3, ART BLOOD GAS (G3+)
Acid-base deficit: 1 mmol/L (ref 0.0–2.0)
Bicarbonate: 23.3 mEq/L (ref 20.0–24.0)
Patient temperature: 98.6
pH, Arterial: 7.423 (ref 7.350–7.450)

## 2012-11-17 MED ORDER — PROPOFOL 10 MG/ML IV BOLUS
INTRAVENOUS | Status: AC
  Filled 2012-11-17: qty 1

## 2012-11-17 MED ORDER — ETOMIDATE 2 MG/ML IV SOLN
INTRAVENOUS | Status: AC
  Filled 2012-11-17: qty 20

## 2012-11-17 MED ORDER — ENOXAPARIN SODIUM 40 MG/0.4ML ~~LOC~~ SOLN
40.0000 mg | SUBCUTANEOUS | Status: DC
  Administered 2012-11-17 – 2012-11-19 (×3): 40 mg via SUBCUTANEOUS
  Filled 2012-11-17 (×4): qty 0.4

## 2012-11-17 MED ORDER — SODIUM CHLORIDE 0.9 % IV SOLN
500.0000 mg | Freq: Two times a day (BID) | INTRAVENOUS | Status: DC
  Administered 2012-11-17 – 2012-11-18 (×4): 500 mg via INTRAVENOUS
  Filled 2012-11-17 (×5): qty 5

## 2012-11-17 MED ORDER — LORAZEPAM 2 MG/ML IJ SOLN
INTRAMUSCULAR | Status: AC
  Filled 2012-11-17: qty 1

## 2012-11-17 MED ORDER — PROPOFOL 10 MG/ML IV EMUL
5.0000 ug/kg/min | INTRAVENOUS | Status: DC
  Administered 2012-11-17: 30 ug/kg/min via INTRAVENOUS
  Filled 2012-11-17 (×2): qty 100

## 2012-11-17 MED ORDER — LIDOCAINE HCL (CARDIAC) 20 MG/ML IV SOLN
INTRAVENOUS | Status: AC
  Filled 2012-11-17: qty 5

## 2012-11-17 MED ORDER — BIOTENE DRY MOUTH MT LIQD
15.0000 mL | Freq: Four times a day (QID) | OROMUCOSAL | Status: DC
  Administered 2012-11-17 – 2012-11-18 (×4): 15 mL via OROMUCOSAL

## 2012-11-17 MED ORDER — MIDAZOLAM HCL 2 MG/2ML IJ SOLN
INTRAMUSCULAR | Status: AC
  Administered 2012-11-17: 2 mg via INTRAVENOUS
  Filled 2012-11-17: qty 2

## 2012-11-17 MED ORDER — SODIUM CHLORIDE 0.9 % IV SOLN
3.0000 g | Freq: Four times a day (QID) | INTRAVENOUS | Status: DC
  Administered 2012-11-17 – 2012-11-18 (×7): 3 g via INTRAVENOUS
  Filled 2012-11-17 (×11): qty 3

## 2012-11-17 MED ORDER — SUCCINYLCHOLINE CHLORIDE 20 MG/ML IJ SOLN
INTRAMUSCULAR | Status: AC
  Filled 2012-11-17: qty 5

## 2012-11-17 MED ORDER — LORAZEPAM 2 MG/ML IJ SOLN
1.0000 mg | Freq: Once | INTRAMUSCULAR | Status: AC
  Administered 2012-11-17: 1 mg via INTRAVENOUS

## 2012-11-17 MED ORDER — SODIUM CHLORIDE 0.9 % IV SOLN
1.0000 mg/h | INTRAVENOUS | Status: DC
  Administered 2012-11-17: 3 mg/h via INTRAVENOUS
  Administered 2012-11-17: 2 mg/h via INTRAVENOUS
  Administered 2012-11-18: 5 mg/h via INTRAVENOUS
  Filled 2012-11-17 (×4): qty 10

## 2012-11-17 MED ORDER — FENTANYL CITRATE 0.05 MG/ML IJ SOLN
100.0000 ug | Freq: Once | INTRAMUSCULAR | Status: AC
  Administered 2012-11-17: 100 ug via INTRAVENOUS

## 2012-11-17 MED ORDER — SODIUM CHLORIDE 0.9 % IV SOLN
250.0000 mL | INTRAVENOUS | Status: DC | PRN
  Administered 2012-11-17: 250 mL via INTRAVENOUS

## 2012-11-17 MED ORDER — SUCCINYLCHOLINE CHLORIDE 20 MG/ML IJ SOLN
120.0000 mg | Freq: Once | INTRAMUSCULAR | Status: AC
  Administered 2012-11-17: 120 mg via INTRAVENOUS

## 2012-11-17 MED ORDER — ETOMIDATE 2 MG/ML IV SOLN
20.0000 mg | Freq: Once | INTRAVENOUS | Status: AC
  Administered 2012-11-17: 20 mg via INTRAVENOUS

## 2012-11-17 MED ORDER — ROCURONIUM BROMIDE 50 MG/5ML IV SOLN
INTRAVENOUS | Status: AC
  Filled 2012-11-17: qty 2

## 2012-11-17 MED ORDER — SODIUM CHLORIDE 0.9 % IV SOLN
25.0000 ug/h | INTRAVENOUS | Status: DC
  Administered 2012-11-17: 100 ug/h via INTRAVENOUS
  Administered 2012-11-18: 150 ug/h via INTRAVENOUS
  Filled 2012-11-17 (×2): qty 50

## 2012-11-17 MED ORDER — DEXMEDETOMIDINE HCL IN NACL 400 MCG/100ML IV SOLN
0.4000 ug/kg/h | INTRAVENOUS | Status: DC
  Filled 2012-11-17: qty 100

## 2012-11-17 MED ORDER — PANTOPRAZOLE SODIUM 40 MG IV SOLR
40.0000 mg | Freq: Every day | INTRAVENOUS | Status: DC
  Administered 2012-11-17: 40 mg via INTRAVENOUS
  Filled 2012-11-17 (×2): qty 40

## 2012-11-17 MED ORDER — SODIUM CHLORIDE 0.9 % IV SOLN
10.0000 ug/h | INTRAVENOUS | Status: DC
  Filled 2012-11-17: qty 50

## 2012-11-17 MED ORDER — LORAZEPAM 2 MG/ML IJ SOLN
1.0000 mg | Freq: Once | INTRAMUSCULAR | Status: AC
  Administered 2012-11-17: 1 mg via INTRAVENOUS
  Filled 2012-11-17: qty 1

## 2012-11-17 MED ORDER — PROPOFOL 10 MG/ML IV EMUL: 5.0000 ug/kg/min | Freq: Once | INTRAVENOUS | Status: DC

## 2012-11-17 MED ORDER — FENTANYL BOLUS VIA INFUSION
25.0000 ug | Freq: Four times a day (QID) | INTRAVENOUS | Status: DC | PRN
  Filled 2012-11-17: qty 100

## 2012-11-17 MED ORDER — SODIUM CHLORIDE 0.9 % IV SOLN: INTRAVENOUS | Status: AC

## 2012-11-17 MED ORDER — MIDAZOLAM HCL 2 MG/2ML IJ SOLN: 2.0000 mg | Freq: Once | INTRAMUSCULAR | Status: AC

## 2012-11-17 MED ORDER — SODIUM CHLORIDE 0.9 % IV SOLN
500.0000 mg | Freq: Once | INTRAVENOUS | Status: AC
  Administered 2012-11-17: 500 mg via INTRAVENOUS
  Filled 2012-11-17: qty 5

## 2012-11-17 MED ORDER — MIDAZOLAM BOLUS VIA INFUSION
1.0000 mg | INTRAVENOUS | Status: DC | PRN
  Filled 2012-11-17: qty 2

## 2012-11-17 MED ORDER — SODIUM CHLORIDE 0.9 % IV SOLN
1000.0000 mg | Freq: Once | INTRAVENOUS | Status: AC
  Administered 2012-11-17: 1000 mg via INTRAVENOUS
  Filled 2012-11-17: qty 20

## 2012-11-17 MED ORDER — PHENYTOIN SODIUM 50 MG/ML IJ SOLN
1000.0000 mg | Freq: Once | INTRAMUSCULAR | Status: AC
  Administered 2012-11-17: 1000 mg via INTRAVENOUS
  Filled 2012-11-17: qty 20

## 2012-11-17 MED ORDER — LORAZEPAM 2 MG/ML IJ SOLN: 1.0000 mg | INTRAMUSCULAR | Status: DC | PRN

## 2012-11-17 MED ORDER — CHLORHEXIDINE GLUCONATE 0.12 % MT SOLN
15.0000 mL | Freq: Two times a day (BID) | OROMUCOSAL | Status: DC
  Administered 2012-11-17 – 2012-11-18 (×2): 15 mL via OROMUCOSAL
  Filled 2012-11-17 (×2): qty 15

## 2012-11-17 MED ORDER — SODIUM CHLORIDE 0.9 % IV SOLN
200.0000 mg | Freq: Two times a day (BID) | INTRAVENOUS | Status: DC
  Administered 2012-11-17 – 2012-11-19 (×3): 200 mg via INTRAVENOUS
  Filled 2012-11-17 (×7): qty 4

## 2012-11-17 MED ORDER — MIDAZOLAM HCL 2 MG/2ML IJ SOLN
INTRAMUSCULAR | Status: AC
  Administered 2012-11-17: 2 mg
  Filled 2012-11-17: qty 2

## 2012-11-17 NOTE — ED Notes (Signed)
Unable to obtain labs at this time due to patient agitated.

## 2012-11-17 NOTE — Progress Notes (Signed)
Patient intubated by ED physician. Patient settings are VT 620, FIO2 60%, Peep 5, and rate of 15. Patient was very unsettled and was very anxious and agiitiated . Patient being transported to [facility] by care link .AGAS done by RT. CT scan done by radiology.  Patient is stable at this time

## 2012-11-17 NOTE — ED Provider Notes (Signed)
CSN: [id number]     Arrival date & time 11/16/12  2318 History     First MD Initiated Contact with Patient 11/17/12 [phone]     Chief Complaint  Patient presents with  . Seizures   (Consider location/radiation/quality/duration/timing/severity/associated sxs/prior Treatment) HPI Comments: A history of seizures.  Was hospitalized July in status.  Brother, who is at the bedside, states, that the patient.  Has been taking his medications as far as he knows.  Tonight.  He walked out to his truck to retrieve an object is gone for a prolonged period of time and when family went looking for him.  He was found facedown in the driveway actively seizing.    Patient is a 32 y.o. male presenting with seizures. The history is provided by the patient and a relative.  Seizures Seizure activity on arrival: no   Seizure type:  Unable to specify Initial focality:  Right-sided Episode characteristics: abnormal movements, focal shaking and unresponsiveness   Episode characteristics: no incontinence   Postictal symptoms: somnolence   Return to baseline: no   Severity:  Moderate Timing:  Clustered Progression:  Worsening   Past Medical History  Diagnosis Date  . Back pain, chronic   . Seizures    Past Surgical History  Procedure Laterality Date  . Cholecystectomy     Family History  Problem Relation Age of Onset  . Cancer Father   . Diabetes Other   . Coronary artery disease Other    History  Substance Use Topics  . Smoking status: Current Every Day Smoker -- 0.75 packs/day    Types: Cigarettes  . Smokeless tobacco: Not on file  . Alcohol Use: No    Review of Systems  Unable to perform ROS: Acuity of condition  Constitutional: Negative for fever.  Skin: Positive for wound.  Neurological: Positive for seizures.  All other systems reviewed and are negative.    Allergies  Naproxen  Home Medications   Current Outpatient Rx  Name  Route  Sig  Dispense  Refill  . levETIRAcetam  (KEPPRA) 500 MG tablet   Oral   Take 500 mg by mouth 2 (two) times daily.          Marland Kitchen oxyCODONE-acetaminophen (PERCOCET/ROXICET) 5-325 MG per tablet   Oral   Take 1-2 tablets by mouth every 6 (six) hours as needed.   30 tablet   0   . potassium chloride SA (K-DUR,KLOR-CON) 20 MEQ tablet   Oral   Take 2 tablets (40 mEq total) by mouth daily.   10 tablet   0    BP 106/61  Pulse 85  Temp(Src) 98.6 F (37 C) (Oral)  Resp 15  Ht 5\' 9"  (1.753 m)  Wt 250 lb (113.399 kg)  BMI 36.9 kg/m2  SpO2 100% Physical Exam  Nursing note and vitals reviewed. Constitutional: He appears well-developed and well-nourished.  HENT:  Head: Normocephalic.    Eyes: Right eye exhibits nystagmus. Left eye exhibits nystagmus.  Cardiovascular: Normal rate and regular rhythm.   Pulmonary/Chest: Effort normal and breath sounds normal.  Neurological:  Patient having nystagmus and focal right arm shaking.  He is unresponsive    ED Course   Procedures (including critical care time)  Labs Reviewed  CBC - Abnormal; Notable for the following:    WBC 10.9 (*)    All other components within normal limits  BASIC METABOLIC PANEL - Abnormal; Notable for the following:    Glucose, Bld 103 (*)    All other  components within normal limits  GLUCOSE, CAPILLARY - Abnormal; Notable for the following:    Glucose-Capillary 110 (*)    All other components within normal limits  URINE RAPID DRUG SCREEN (HOSP PERFORMED) - Abnormal; Notable for the following:    Benzodiazepines POSITIVE (*)    Tetrahydrocannabinol POSITIVE (*)    All other components within normal limits  BLOOD GAS, ARTERIAL - Abnormal; Notable for the following:    pH, Arterial 7.315 (*)    pCO2 arterial 51.7 (*)    pO2, Arterial 334.0 (*)    Bicarbonate 25.6 (*)    All other components within normal limits  ETHANOL  LEVETIRACETAM LEVEL   Dg Chest Portable 1 View  11/17/2012   *RADIOLOGY REPORT*  Clinical Data: Seizure.  Endotracheal tube  placement.  PORTABLE CHEST - 1 VIEW  Comparison: 10/20/2012.  Findings: High endotracheal tube, ending at the level of T1-T2 interspace.  The retention balloon is just below the larynx.  Low lung volumes.  There is right upper lobe collapse. No abnormality seen in the right hilum on previous radiography.  No evident effusion or pneumothorax.  These results were called by telephone on 11/18/2011 at 04:10 a.m. to Dr Theodoro Kalata, who verbally acknowledged these results.  IMPRESSION:  1.  High endotracheal tube, recommend advancing 2-3 cm. 2.  Right upper lobe collapse, likely from aspiration given seizures.   Original Report Authenticated By: Tiburcio Pea  ED ECG REPORT   Date: 11/17/2012  EKG Time: 5:23 AM  Rate: 66  Rhythm: normal sinus rhythm,  unchanged from previous tracings  Axis: normal  Intervals:none  ST&T Change: normal  Narrative Interpretation: normal           1. Status epilepticus     MDM   Patient is in status epilepticus.  He has been intubated and awaiting neurology consult.  I have asked that he be given a loading dose of Keppra plus at 1000 mg of Dilantin.  Has had a total of 3 mg of Ativan with cessation of his seizure  Arman Filter, NP 11/17/12 1610  Arman Filter, NP 11/17/12 9604  Arman Filter, NP 11/17/12 [id number]

## 2012-11-17 NOTE — Progress Notes (Signed)
PULMONARY  / CRITICAL CARE MEDICINE  Name: Brandon Blake MRN: [id number] DOB: Jan 07, 1981    ADMISSION DATE:  11/16/2012 CONSULTATION DATE:  11/17/2012  REFERRING MD :  Dierdre Highman PRIMARY SERVICE: PCCM  CHIEF COMPLAINT:    BRIEF PATIENT DESCRIPTION: 32 y/o male was admitted through the [facility] ED on 8/14 after he presented with status epilepticus requiring intubation and multiple anti-epileptics.  SIGNIFICANT EVENTS / STUDIES:  8/14 admission 8/14 CT head > NAICP, sinusitis 8/14 CT spine > no evidence of acute trauma to cervical spine  LINES / TUBES: 8/14 ETT >>  CULTURES: 8/14 blood >> 8/14 resp >>  ANTIBIOTICS: 8/14 unasyn >>  HISTORY OF PRESENT ILLNESS:  32 y/o male was admitted through the [facility] ED on 8/14 after he presented with status epilepticus requiring intubation and multiple anti-epileptics.  He is intubated and no family is available to provide history.  Per chart review and discussing with [facility] staff, it appears that he has a history of seizure disorder and had been compliant with medications lately.  On 8/13 was found down by family in driveway seizing after he had been seen in his usual state of health minutes before.  In the [facility] ED he had persistent seizures despite dilantin, keppra, and ativan.  He was intubated and started on a propofol drip on which he still had continued seizures.  PAST MEDICAL HISTORY :  Past Medical History  Diagnosis Date  . Back pain, chronic   . Seizures    Past Surgical History  Procedure Laterality Date  . Cholecystectomy     Prior to Admission medications   Medication Sig Start Date End Date Taking? Authorizing Provider  levETIRAcetam (KEPPRA) 500 MG tablet Take 500 mg by mouth 2 (two) times daily.    Yes Historical Provider, MD  oxyCODONE-acetaminophen (PERCOCET/ROXICET) 5-325 MG per tablet Take 1-2 tablets by mouth every 6 (six) hours as needed. 10/22/12  Yes Pamella Pert, MD  potassium chloride SA (K-DUR,KLOR-CON) 20 MEQ tablet  Take 2 tablets (40 mEq total) by mouth daily. 10/16/12  Yes Dorothea Ogle, MD   Allergies  Allergen Reactions  . Naproxen Hives and Rash    Swelling of hands     FAMILY HISTORY:  Family History  Problem Relation Age of Onset  . Cancer Father   . Diabetes Other   . Coronary artery disease Other    SOCIAL HISTORY:  reports that he has been smoking Cigarettes.  He has been smoking about 0.75 packs per day. He does not have any smokeless tobacco history on file. He reports that he does not drink alcohol or use illicit drugs. By report, some history of polysubtance abuse  REVIEW OF SYSTEMS:  Cannot obtain due to intubation  SUBJECTIVE:   VITAL SIGNS: Temp:  [97.7 F (36.5 C)-98.6 F (37 C)] 97.7 F (36.5 C) (08/14 0800) Pulse Rate:  [65-110] 72 (08/14 1204) Resp:  [14-24] 20 (08/14 1204) BP: (98-144)/(55-108) 99/64 mmHg (08/14 1204) SpO2:  [95 %-100 %] 100 % (08/14 1204) FiO2 (%):  [30 %-100 %] 30 % (08/14 1204) Weight:  [113.399 kg (250 lb)-118 kg (260 lb 2.3 oz)] 113.399 kg (250 lb) (08/14 0247) HEMODYNAMICS:   VENTILATOR SETTINGS: Vent Mode:  [-] PRVC FiO2 (%):  [30 %-100 %] 30 % Set Rate:  [15 bmp-20 bmp] 20 bmp Vt Set:  [500 mL-620 mL] 620 mL PEEP:  [5 cmH20] 5 cmH20 Plateau Pressure:  [16 cmH20-28 cmH20] 17 cmH20 INTAKE / OUTPUT: Intake/Output  08/13 0701 - 08/14 0700 08/14 0701 - 08/15 0700   I.V. (mL/kg) 132 (1.2) 448 (4)   Total Intake(mL/kg) 132 (1.2) 448 (4)   Urine (mL/kg/hr) 125 100 (0.2)   Total Output 125 100   Net +7 +348          PHYSICAL EXAMINATION:  Gen: large male, sedated on vent HEENT: NCAT, PERRL, ETT in place PULM: rhonchi bilaterally CV: RRR, no mgr, no JVD AB: BS+, soft, nontender, no hsm Ext: warm, no edema, no clubbing, no cyanosis Derm: no rash or skin breakdown Neuro: sedated on vent, does not withdraw to pain, by 3100 RN report had a seizure minutes before my arrival   LABS:  CBC Recent Labs     11/16/12  2350   11/17/12  1100  WBC  10.9*  11.5*  HGB  15.2  13.6  HCT  42.8  39.5  PLT  202  171   Coag's No results found for this basename: APTT, INR,  in the last 72 hours BMET Recent Labs     11/16/12  2350  11/17/12  1100  NA  137   --   K  3.7   --   CL  104   --   CO2  27   --   BUN  11   --   CREATININE  0.72  0.72  GLUCOSE  103*   --    Electrolytes Recent Labs     11/16/12  2350  CALCIUM  9.5   Sepsis Markers No results found for this basename: LACTICACIDVEN, PROCALCITON, O2SATVEN,  in the last 72 hours ABG Recent Labs     11/17/12  0336  11/17/12  0838  PHART  7.315*  7.423  PCO2ART  51.7*  35.6  PO2ART  334.0*  86.0   Liver Enzymes No results found for this basename: AST, ALT, ALKPHOS, BILITOT, ALBUMIN,  in the last 72 hours Cardiac Enzymes No results found for this basename: TROPONINI, PROBNP,  in the last 72 hours Glucose Recent Labs     11/16/12  2340  GLUCAP  110*   Imaging  CXR: low lung volumes, ETT in place, RUL collapse EKG: NSR, NO ST wave changes  ASSESSMENT / PLAN:  PULMONARY A:Acute respiratory failure due to inability to protect airway while post ictal RUL collapse from aspiration P:   - Full vent support until cleared by neuro. - ABG now. - Adjust vent. - Daily ABG/CXR.  CARDIOVASCULAR A: No acute issues P:  - Tele. - No need for BP medications for now.  RENAL A:  No acute issues P:   - Monitor UOP. - BMET in AM.  GASTROINTESTINAL A:  No acute issues P:   - OG tube. - Pantoprazole for stress ulcer prophylaxis. - If unable to extubate by AM will start TF.  HEMATOLOGIC A:  No acute issues P:  - Lovenox for dvt prophylaxis.  INFECTIOUS A:  Aspiration pneumonia P:   - Unasyn per pharm. - Blood culture/respiratory culture.  ENDOCRINE A:  No acute issues   P:   - Monitor glucose.  NEUROLOGIC A:  Status epilepticus, no clear inciting event P:   - Versed gtt. - PRN ativan. - Keppra. - Dilantin. - Neurology  consult appreciated.  TODAY'S SUMMARY: maintain sedated until neuro has completed work up then will begin weaning trials in AM.  Begin TF in AM if unable to extubate or neuro needs more time.  I have personally obtained a  history, examined the patient, evaluated laboratory and imaging results, formulated the assessment and plan and placed orders.  CRITICAL CARE: The patient is critically ill with multiple organ systems failure and requires high complexity decision making for assessment and support, frequent evaluation and titration of therapies, application of advanced monitoring technologies and extensive interpretation of multiple databases. Critical Care Time devoted to patient care services described in this note is 40 minutes.   YACOUB,WESAM,MD Pulmonary and Critical Care Medicine [facility] Pager: [phone]  11/17/2012, 12:21 PM

## 2012-11-17 NOTE — Progress Notes (Signed)
EEG Completed; Results Pending  

## 2012-11-17 NOTE — ED Provider Notes (Signed)
2:00 AM d/w PCCM and for persistent Sz activity despite multiple rounds of benzos, decision made to intubate. Keppra was given, Dilantin ordered, propofol sedation. NEU consult.  INTUBATION Performed by: Sunnie Nielsen  Required items: required blood products, implants, devices, and special equipment available Patient identity confirmed: provided demographic data and hospital-assigned identification number Time out: Immediately prior to procedure a "time out" was called to verify the correct patient, procedure, equipment, support staff and site/side marked as required.  Indications: Status Epilepticus  Intubation method: DL Mac 3  Preoxygenation: [place] 2L and BVM  Sedatives: 20Etomidate Paralytic: 120Succinylcholine  Tube Size: 7.5 cuffed  Post-procedure assessment: chest rise and ETCO2 monitor Breath sounds: equal and absent over the epigastrium Tube secured with: ETT holder Chest x-ray interpreted by radiologist and me.  Chest x-ray findings: 7.5 endotracheal tube in appropriate position  Patient tolerated the procedure well with no immediate complications.    3:02 AM d/w NEU Dr Thad Ranger, recs Tx to [facility] and NEU will follow 3:02 AM d/w Dr Marchelle Gearing, accepts in Tx to [facility] ICU   CRITICAL CARE Performed by: Sunnie Nielsen Total critical care time: 30 Critical care time was exclusive of separately billable procedures and treating other patients. Critical care was necessary to treat or prevent imminent or life-threatening deterioration. Critical care was time spent personally by me on the following activities: development of treatment plan with patient and/or surrogate as well as nursing, discussions with consultants, evaluation of patient's response to treatment, examination of patient, obtaining history from patient or surrogate, ordering and performing treatments and interventions, ordering and review of laboratory studies, ordering and review of radiographic studies, pulse oximetry and  re-evaluation of patient's condition. Intubation, vent management, sedation management, family updated, NEU and PCCM consults, ICU admit    Sunnie Nielsen, MD 11/17/12 [phone]

## 2012-11-17 NOTE — ED Notes (Signed)
Brother(Christopher Hutchens) has taken his belongings.

## 2012-11-17 NOTE — Progress Notes (Signed)
[facility] Physician-Brief Progress Note Patient Name: Brandon Blake DOB: [birth year] MRN: [id number]  Date of Service  11/17/2012   HPI/Events of Note  Call from Dr Jerilynn Mages ER  Status epilepticus . Intubated. He d/w neuro. We all felt best to admit to [facility] ICU  eICU Interventions  [facility] at [facility] under PCCM   Intervention Category Major Interventions: Seizures - evaluation and management  Brandon Blake  11/17/2012, 3:01 AM

## 2012-11-17 NOTE — Consult Note (Signed)
NEURO HOSPITALIST CONSULT NOTE    Reason for Consult: status epilepticus  HPI:                                                                                                                                          SHERIF MILLSPAUGH is an 32 y.o. male with a past medical history significant for polysubstance abuse and seizure disorder with apparently good adherence to treatment, transferred to [facility] NICU for further management of generalized convulsive SE. He is sedated, intubated and thus unable to provide clinical information. Review of his chart indicate that on 8/13 he was found down by family in driveway seizing after he had been seen in his usual state of health minutes before. He was taken to [facility] ED  Where he continued having generalized seizures despite dilantin (total loading dose 2,000 mg), keppra ( 1 gram IV), and several doses of ativan. He was intubated, started on a propofol drip, and transferred to [facility] . Nursing staff reports that he had further seizures upon arrival to the NICU and just had some twitching of his neck few minutes ago. No recent fever or medical illness and family stated that he takes his anti-seizure medications faithfully.  Currently on propofol, versed, fentanyl, keppra, and dilantin. No dilantin level available at this moment. Urine drug screen positive for tetrahydrocannabinol.  Past Medical History  Diagnosis Date  . Back pain, chronic   . Seizures     Past Surgical History  Procedure Laterality Date  . Cholecystectomy      Family History  Problem Relation Age of Onset  . Cancer Father   . Diabetes Other   . Coronary artery disease Other     Social History:  reports that he has been smoking Cigarettes.  He has been smoking about 0.75 packs per day. He does not have any smokeless tobacco history on file. He reports that he does not drink alcohol or use illicit drugs.  Allergies  Allergen Reactions  . Naproxen Hives and Rash     Swelling of hands     MEDICATIONS:                                                                                                                     I have  reviewed the patient's current medications.   ROS: unable to obtain due to mental status.                                                                                                                                      History obtained from chart review    Physical exam: sedated, intubated on the vent. Head: normocephalic. Neck: supple, no bruits, no JVD. Cardiac: no murmurs. Lungs: clear. Abdomen: soft, no tender, no mass. Extremities: no edema.  Blood pressure 105/68, pulse 78, temperature 97.7 F (36.5 C), temperature source Axillary, resp. rate 16, height 5\' 9"  (1.753 m), weight 113.399 kg (250 lb), SpO2 100.00%.   Neurologic Examination:   Limited due to sedation with propofol, fentanyl, and versed                                            Mental Status: Sedated and intubated Cranial Nerves: II: Discs flat bilaterally; Visual fields unable to test, pupils 2 mm round, reactive to light III,IV, VI: ptosis not present, extra-ocular motions intact bilaterally on Doll's maneuver V :unreliable VII: face appears to be symmetric VIII: hearing no tested IX,X: intubated XI: unable to test shoulder shrug XII: intubated Motor: No spontaneous movements but heavily sedated Tone and bulk:normal tone throughout; no atrophy noted Sensory: no reaction to painful stimuli but on heavy sedation Deep Tendon Reflexes:  1+ all over  Plantars: Right: downgoing   Left: downgoing Cerebellar: normal finger-to-nose,  normal heel-to-shin test Gait:  Can not be tested at this moment CV: pulses palpable throughout    No results found for this basename: cbc, bmp, coags, chol, tri, ldl, hga1c    Results for orders placed during the hospital encounter of 11/16/12 (from the past 48 hour(s))  GLUCOSE, CAPILLARY     Status:  Abnormal   Collection Time    11/16/12 11:40 PM      Result Value Range   Glucose-Capillary 110 (*) 70 - 99 mg/dL  CBC     Status: Abnormal   Collection Time    11/16/12 11:50 PM      Result Value Range   WBC 10.9 (*) 4.0 - 10.5 K/uL   RBC 4.78  4.22 - 5.81 MIL/uL   Hemoglobin 15.2  13.0 - 17.0 g/dL   HCT 16.1  09.6 - 04.5 %   MCV 89.5  78.0 - 100.0 fL   MCH 31.8  26.0 - 34.0 pg   MCHC 35.5  30.0 - 36.0 g/dL   RDW 40.9  81.1 - 91.4 %   Platelets 202  150 - 400 K/uL  BASIC METABOLIC PANEL     Status: Abnormal   Collection Time    11/16/12 11:50 PM      Result Value Range   Sodium 137  135 - 145 mEq/L   Potassium 3.7  3.5 - 5.1 mEq/L   Chloride 104  96 - 112 mEq/L   CO2 27  19 - 32 mEq/L   Glucose, Bld 103 (*) 70 - 99 mg/dL   BUN 11  6 - 23 mg/dL   Creatinine, Ser 5.62  0.50 - 1.35 mg/dL   Calcium 9.5  8.4 - 13.0 mg/dL   GFR calc non Af Amer >90  >90 mL/min   GFR calc Af Amer >90  >90 mL/min   Comment: (NOTE)     The eGFR has been calculated using the CKD EPI equation.     This calculation has not been validated in all clinical situations.     eGFR's persistently <90 mL/min signify possible Chronic Kidney     Disease.  ETHANOL     Status: None   Collection Time    11/17/12 12:25 AM      Result Value Range   Alcohol, Ethyl (B) <11  0 - 11 mg/dL   Comment:            LOWEST DETECTABLE LIMIT FOR     SERUM ALCOHOL IS 11 mg/dL     FOR MEDICAL PURPOSES ONLY  URINE RAPID DRUG SCREEN (HOSP PERFORMED)     Status: Abnormal   Collection Time    11/17/12  3:23 AM      Result Value Range   Opiates NONE DETECTED  NONE DETECTED   Cocaine NONE DETECTED  NONE DETECTED   Benzodiazepines POSITIVE (*) NONE DETECTED   Amphetamines NONE DETECTED  NONE DETECTED   Tetrahydrocannabinol POSITIVE (*) NONE DETECTED   Barbiturates NONE DETECTED  NONE DETECTED   Comment:            DRUG SCREEN FOR MEDICAL PURPOSES     ONLY.  IF CONFIRMATION IS NEEDED     FOR ANY PURPOSE, NOTIFY LAB      WITHIN 5 DAYS.                LOWEST DETECTABLE LIMITS     FOR URINE DRUG SCREEN     Drug Class       Cutoff (ng/mL)     Amphetamine      1000     Barbiturate      200     Benzodiazepine   200     Tricyclics       300     Opiates          300     Cocaine          300     THC              50  BLOOD GAS, ARTERIAL     Status: Abnormal   Collection Time    11/17/12  3:36 AM      Result Value Range   FIO2 1.00     Delivery systems VENTILATOR     Mode PRESSURE REGULATED VOLUME CONTROL     VT 620     Rate 15     Peep/cpap 5.0     pH, Arterial 7.315 (*) 7.350 - 7.450   pCO2 arterial 51.7 (*) 35.0 - 45.0 mmHg   pO2, Arterial 334.0 (*) 80.0 - 100.0 mmHg   Bicarbonate 25.6 (*) 20.0 - 24.0 mEq/L   TCO2 23.0  0 - 100 mmol/L   Acid-base deficit 0.8  0.0 - 2.0 mmol/L   O2 Saturation 99.4  Patient temperature 98.6     Collection site RIGHT RADIAL     Drawn by [phone]     Sample type ARTERIAL DRAW     Allens test (pass/fail) PASS  PASS    Ct Head Wo Contrast  11/17/2012   *RADIOLOGY REPORT*  Clinical Data:  Possible seizure.  History of falling.  CT HEAD WITHOUT CONTRAST CT CERVICAL SPINE WITHOUT CONTRAST  Technique:  Multidetector CT imaging of the head and cervical spine was performed following the standard protocol without intravenous contrast.  Multiplanar CT image reconstructions of the cervical spine were also generated.  Comparison:  Head CT 10/21/2012.  CT HEAD  Findings: No acute intracranial abnormalities.  Specifically, no evidence of acute intracranial hemorrhage, no definite findings of acute/subacute cerebral ischemia, no mass, mass effect, hydrocephalus or abnormal intra or extra-axial fluid collections. No acute displaced skull fractures are identified.  Mastoids are well pneumatized bilaterally.  Extensive mucoperiosteal thickening throughout the paranasal sinuses, compatible with chronic sinusitis.  IMPRESSION: 1.  No acute intracranial abnormalities. 2.  Changes of chronic  sinusitis throughout the paranasal sinuses.  CT CERVICAL SPINE  Findings: No acute displaced fractures of the cervical spine. Alignment is anatomic.  Prevertebral soft tissues appear normal. Visualized portions of the upper thorax are unremarkable.  IMPRESSION: No evidence of significant acute traumatic injury to the cervical spine.   Original Report Authenticated By: Trudie Reed, M.D.   Ct Cervical Spine Wo Contrast  11/17/2012   *RADIOLOGY REPORT*  Clinical Data:  Possible seizure.  History of falling.  CT HEAD WITHOUT CONTRAST CT CERVICAL SPINE WITHOUT CONTRAST  Technique:  Multidetector CT imaging of the head and cervical spine was performed following the standard protocol without intravenous contrast.  Multiplanar CT image reconstructions of the cervical spine were also generated.  Comparison:  Head CT 10/21/2012.  CT HEAD  Findings: No acute intracranial abnormalities.  Specifically, no evidence of acute intracranial hemorrhage, no definite findings of acute/subacute cerebral ischemia, no mass, mass effect, hydrocephalus or abnormal intra or extra-axial fluid collections. No acute displaced skull fractures are identified.  Mastoids are well pneumatized bilaterally.  Extensive mucoperiosteal thickening throughout the paranasal sinuses, compatible with chronic sinusitis.  IMPRESSION: 1.  No acute intracranial abnormalities. 2.  Changes of chronic sinusitis throughout the paranasal sinuses.  CT CERVICAL SPINE  Findings: No acute displaced fractures of the cervical spine. Alignment is anatomic.  Prevertebral soft tissues appear normal. Visualized portions of the upper thorax are unremarkable.  IMPRESSION: No evidence of significant acute traumatic injury to the cervical spine.   Original Report Authenticated By: Trudie Reed, M.D.   Dg Chest Portable 1 View  11/17/2012   *RADIOLOGY REPORT*  Clinical Data: Seizure.  Endotracheal tube placement.  PORTABLE CHEST - 1 VIEW  Comparison: 10/20/2012.   Findings: High endotracheal tube, ending at the level of T1-T2 interspace.  The retention balloon is just below the larynx.  Low lung volumes.  There is right upper lobe collapse. No abnormality seen in the right hilum on previous radiography.  No evident effusion or pneumothorax.  These results were called by telephone on 11/18/2011 at 04:10 a.m. to Dr Theodoro Kalata, who verbally acknowledged these results.  IMPRESSION:  1.  High endotracheal tube, recommend advancing 2-3 cm. 2.  Right upper lobe collapse, likely from aspiration given seizures.   Original Report Authenticated By: Tiburcio Pea     Assessment/Plan: 32 years old with a history of seizures and polysubstance abuse, transferred to [facility] for further management of GTC SE.  Currently sedated on propofol, versed, fentanyl. Keppra 500 mg IV Q 12 and dilantin 200 MG iv q 12. Recommend: 1) continuous EEG monitoring to address for possible non convulsive SE 2) STAT dilantin level ( keep high therapeutic level) 3) Continue current AEDs. Will follow up.  Wyatt Portela, MD [facility] [phone]  11/17/2012, 7:37 AM

## 2012-11-17 NOTE — ED Notes (Addendum)
Intubation completed by Dr. Dierdre Highman. Tube placed 24 at the Lips.  Good color change with good lung sounds

## 2012-11-17 NOTE — Progress Notes (Signed)
EEG showed no evidence of epileptiform discharges or electrographic seizures. 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 Agree with plan for extubation tomorrow.  Wyatt Portela ,MD [facility]

## 2012-11-17 NOTE — H&P (Signed)
PULMONARY  / CRITICAL CARE MEDICINE  Name: Brandon Blake MRN: [id number] DOB: 04-Jul-1980    ADMISSION DATE:  11/16/2012 CONSULTATION DATE:  11/17/2012  REFERRING MD :  Dierdre Highman PRIMARY SERVICE: PCCM  CHIEF COMPLAINT:    BRIEF PATIENT DESCRIPTION: 32 y/o male was admitted through the [facility] ED on 8/14 after he presented with status epilepticus requiring intubation and multiple anti-epileptics.  SIGNIFICANT EVENTS / STUDIES:  8/14 admission 8/14 CT head > NAICP, sinusitis 8/14 CT spine > no evidence of acute trauma to cervical spine  LINES / TUBES: 8/14 ETT >>  CULTURES: 8/14 blood >> 8/14 resp >>  ANTIBIOTICS: 8/14 unasyn >>  HISTORY OF PRESENT ILLNESS:  32 y/o male was admitted through the [facility] ED on 8/14 after he presented with status epilepticus requiring intubation and multiple anti-epileptics.  He is intubated and no family is available to provide history.  Per chart review and discussing with [facility] staff, it appears that he has a history of seizure disorder and had been compliant with medications lately.  On 8/13 was found down by family in driveway seizing after he had been seen in his usual state of health minutes before.  In the [facility] ED he had persistent seizures despite dilantin, keppra, and ativan.  He was intubated and started on a propofol drip on which he still had continued seizures.  PAST MEDICAL HISTORY :  Past Medical History  Diagnosis Date  . Back pain, chronic   . Seizures    Past Surgical History  Procedure Laterality Date  . Cholecystectomy     Prior to Admission medications   Medication Sig Start Date End Date Taking? Authorizing Provider  levETIRAcetam (KEPPRA) 500 MG tablet Take 500 mg by mouth 2 (two) times daily.    Yes Historical Provider, MD  oxyCODONE-acetaminophen (PERCOCET/ROXICET) 5-325 MG per tablet Take 1-2 tablets by mouth every 6 (six) hours as needed. 10/22/12  Yes Pamella Pert, MD  potassium chloride SA (K-DUR,KLOR-CON) 20 MEQ tablet  Take 2 tablets (40 mEq total) by mouth daily. 10/16/12  Yes Dorothea Ogle, MD   Allergies  Allergen Reactions  . Naproxen Hives and Rash    Swelling of hands     FAMILY HISTORY:  Family History  Problem Relation Age of Onset  . Cancer Father   . Diabetes Other   . Coronary artery disease Other    SOCIAL HISTORY:  reports that he has been smoking Cigarettes.  He has been smoking about 0.75 packs per day. He does not have any smokeless tobacco history on file. He reports that he does not drink alcohol or use illicit drugs. By report, some history of polysubtance abuse  REVIEW OF SYSTEMS:  Cannot obtain due to intubation  SUBJECTIVE:   VITAL SIGNS: Temp:  [98.6 F (37 C)] 98.6 F (37 C) (08/13 2335) Pulse Rate:  [65-110] 85 (08/14 0415) Resp:  [14-24] 15 (08/14 0415) BP: (106-144)/(55-108) 106/61 mmHg (08/14 0415) SpO2:  [95 %-100 %] 100 % (08/14 0415) FiO2 (%):  [60 %-100 %] 60 % (08/14 0521) Weight:  [113.399 kg (250 lb)-118 kg (260 lb 2.3 oz)] 113.399 kg (250 lb) (08/14 0247) HEMODYNAMICS:   VENTILATOR SETTINGS: Vent Mode:  [-] PRVC FiO2 (%):  [60 %-100 %] 60 % Set Rate:  [15 bmp] 15 bmp Vt Set:  [500 mL-620 mL] 620 mL PEEP:  [5 cmH20] 5 cmH20 Plateau Pressure:  [28 cmH20] 28 cmH20 INTAKE / OUTPUT: Intake/Output   None     PHYSICAL EXAMINATION:  Gen: large male, sedated on vent HEENT: NCAT, PERRL, ETT in place PULM: rhonchi bilaterally CV: RRR, no mgr, no JVD AB: BS+, soft, nontender, no hsm Ext: warm, no edema, no clubbing, no cyanosis Derm: no rash or skin breakdown Neuro: sedated on vent, does not withdraw to pain, by 3100 RN report had a seizure minutes before my arrival   LABS:  CBC Recent Labs     11/16/12  2350  WBC  10.9*  HGB  15.2  HCT  42.8  PLT  202   Coag's No results found for this basename: APTT, INR,  in the last 72 hours BMET Recent Labs     11/16/12  2350  NA  137  K  3.7  CL  104  CO2  27  BUN  11  CREATININE  0.72   GLUCOSE  103*   Electrolytes Recent Labs     11/16/12  2350  CALCIUM  9.5   Sepsis Markers No results found for this basename: LACTICACIDVEN, PROCALCITON, O2SATVEN,  in the last 72 hours ABG Recent Labs     11/17/12  0336  PHART  7.315*  PCO2ART  51.7*  PO2ART  334.0*   Liver Enzymes No results found for this basename: AST, ALT, ALKPHOS, BILITOT, ALBUMIN,  in the last 72 hours Cardiac Enzymes No results found for this basename: TROPONINI, PROBNP,  in the last 72 hours Glucose Recent Labs     11/16/12  2340  GLUCAP  110*    Imaging  CXR: low lung volumes, ETT in place, RUL collapse EKG: NSR, NO ST wave changes  ASSESSMENT / PLAN:  PULMONARY A:Acute respiratory failure due to inability to protect airway while post ictal RUL collapse from aspiration P:   -bag lavage per RT -repeat CXR 8/14, if no improvement then bronch -full vent support until seizures controlled -increase RR and repeat ABG later this morning -daily ABG/CXR  CARDIOVASCULAR A: No acute issues P:  -tele  RENAL A:  No acute issues P:   -monitor UOP  GASTROINTESTINAL A:  No acute issues P:   -OG tube -Pantoprazole for stress ulcer prophylaxis  HEMATOLOGIC A:  No acute issues P:  -lovenox for dvt prophylaxis  INFECTIOUS A:  Aspiration pneumonia P:   -unasyn per pharm -blood culture/respiratory culture  ENDOCRINE A:  No acute issues   P:   -monitor glucose  NEUROLOGIC A:  Status epilepticus, no clear inciting event P:   -versed gtt -prn ativan -keppra -dilantin -neurology consult  TODAY'S SUMMARY:   I have personally obtained a history, examined the patient, evaluated laboratory and imaging results, formulated the assessment and plan and placed orders. CRITICAL CARE: The patient is critically ill with multiple organ systems failure and requires high complexity decision making for assessment and support, frequent evaluation and titration of therapies, application of  advanced monitoring technologies and extensive interpretation of multiple databases. Critical Care Time devoted to patient care services described in this note is 40 minutes.   Fonnie Jarvis Pulmonary and Critical Care Medicine [facility] Pager: [phone]  11/17/2012, 5:42 AM

## 2012-11-17 NOTE — ED Provider Notes (Signed)
Medical screening examination/treatment/procedure(s) were conducted as a shared visit with non-physician practitioner(s) and myself.  I personally evaluated the patient during the encounter  Active Seizures despite ativan. Level 5 caveat applies. D/w family bedside, will require intubation and admit with h/o same.     Sunnie Nielsen, MD 11/17/12 [phone]

## 2012-11-17 NOTE — Procedures (Signed)
EEG report.  Brief clinical history: 32  years old male with a history of polysubstance abuse and seizures, admitted to the NICU after sustaining multiple seizures. Patient intubated on the vent. On propofol, fentanyl, versed, dilantin, and keppra. Concern for NCSE.  Technique: this is a 17 channel routine scalp EEG performed at the bedside in the ICU setting, with bipolar and monopolar montages arranged in accordance to the international 10/20 system of electrode placement. One channel was dedicated to EKG recording.  Patient is intubated on the vent. Intermittent photic stimulation was utilized as activating procedure.  Description: the entire recording seems to have a pattern consistent with stage 2 sleep, which shows normal sleep architecture without evidence of intermixed epileptiform discharges or findings to suggest electrographic seizures. Intermittent photic stimulation did induce a normal driving response.  EKG showed sinus rhythm.  Impression: this is a NORMAL asleep EEG. Please, be aware that a normal EEG does not exclude the possibility of epilepsy.  Clinical correlation is advised.  Wyatt Portela, MD

## 2012-11-17 NOTE — Progress Notes (Signed)
MEDICATION RELATED CONSULT NOTE - INITIAL   Pharmacy Consult for dilantin Indication: seizure  Allergies  Allergen Reactions  . Naproxen Hives and Rash    Swelling of hands     Patient Measurements: Height: 5\' 9"  (175.3 cm) Weight: 250 lb (113.399 kg) IBW/kg (Calculated) : 70.7 Adjusted Body Weight:   Vital Signs: Temp: 97.7 F (36.5 C) (08/14 0600) Temp src: Axillary (08/14 0600) BP: 105/68 mmHg (08/14 0600) Pulse Rate: 78 (08/14 0600) Intake/Output from previous day: 08/13 0701 - 08/14 0700 In: 20 [I.V.:20] Out: -  Intake/Output from this shift: Total I/O In: 20 [I.V.:20] Out: -   Labs:  Recent Labs  11/16/12 2350  WBC 10.9*  HGB 15.2  HCT 42.8  PLT 202  CREATININE 0.72   Estimated Creatinine Clearance: 167.7 ml/min (by C-G formula based on Cr of 0.72).   Microbiology: No results found for this or any previous visit (from the past 720 hour(s)).  Medical History: Past Medical History  Diagnosis Date  . Back pain, chronic   . Seizures     Medications: received 1gm dilantin at [facility] ED appx 0400 along with keppra 500mg     Assessment: Status epilepticus.   Goal of Therapy:  Phenytoin 10-20 mcg/ml at steady state   Plan:  Give an additional 1gm bolus and begin 200 mg iv q12h    Obtain a ss phenytoin level and adjust when appropriate.   Unasyn for aspiration with normal renal fx 3gm q6h   Janice Coffin 11/17/2012,6:31 AM

## 2012-11-18 ENCOUNTER — Inpatient Hospital Stay (HOSPITAL_COMMUNITY): Payer: Medicaid Other

## 2012-11-18 DIAGNOSIS — J690 Pneumonitis due to inhalation of food and vomit: Secondary | ICD-10-CM

## 2012-11-18 DIAGNOSIS — Z9119 Patient's noncompliance with other medical treatment and regimen: Secondary | ICD-10-CM

## 2012-11-18 DIAGNOSIS — R569 Unspecified convulsions: Secondary | ICD-10-CM

## 2012-11-18 DIAGNOSIS — Z91199 Patient's noncompliance with other medical treatment and regimen due to unspecified reason: Secondary | ICD-10-CM

## 2012-11-18 DIAGNOSIS — E876 Hypokalemia: Secondary | ICD-10-CM

## 2012-11-18 DIAGNOSIS — J9600 Acute respiratory failure, unspecified whether with hypoxia or hypercapnia: Secondary | ICD-10-CM

## 2012-11-18 LAB — CBC
HCT: 38.5 % — ABNORMAL LOW (ref 39.0–52.0)
MCHC: 34.8 g/dL (ref 30.0–36.0)
Platelets: 166 10*3/uL (ref 150–400)
RDW: 12.2 % (ref 11.5–15.5)
WBC: 11.8 10*3/uL — ABNORMAL HIGH (ref 4.0–10.5)

## 2012-11-18 LAB — BASIC METABOLIC PANEL
Chloride: 103 mEq/L (ref 96–112)
GFR calc Af Amer: >90 mL/min (ref >90)
GFR calc non Af Amer: >90 mL/min (ref >90)
Potassium: 3 mEq/L — ABNORMAL LOW (ref 3.5–5.1)
Sodium: 137 mEq/L (ref 135–145)

## 2012-11-18 LAB — BLOOD GAS, ARTERIAL
Acid-Base Excess: 0.9 mmol/L (ref 0.0–2.0)
Bicarbonate: 24.3 mEq/L — ABNORMAL HIGH (ref 20.0–24.0)
Drawn by: 24513
FIO2: 0.3 %
O2 Saturation: 96.3 %
RATE: 20 resp/min
pCO2 arterial: 34.6 mmHg — ABNORMAL LOW (ref 35.0–45.0)
pO2, Arterial: 70.4 mmHg — ABNORMAL LOW (ref 80.0–100.0)

## 2012-11-18 LAB — MAGNESIUM: Magnesium: 1.4 mg/dL — ABNORMAL LOW (ref 1.5–2.5)

## 2012-11-18 LAB — PHOSPHORUS: Phosphorus: 3.3 mg/dL (ref 2.3–4.6)

## 2012-11-18 MED ORDER — POTASSIUM CHLORIDE 10 MEQ/100ML IV SOLN
10.0000 meq | INTRAVENOUS | Status: AC
  Administered 2012-11-18 (×4): 10 meq via INTRAVENOUS
  Filled 2012-11-18: qty 400

## 2012-11-18 MED ORDER — OXYCODONE-ACETAMINOPHEN 5-325 MG PO TABS
1.0000 | ORAL_TABLET | ORAL | Status: DC | PRN
  Administered 2012-11-18 (×2): 1 via ORAL
  Administered 2012-11-18: 2 via ORAL
  Filled 2012-11-18 (×2): qty 1
  Filled 2012-11-18: qty 2

## 2012-11-18 MED ORDER — MUPIROCIN 2 % EX OINT
1.0000 "application " | TOPICAL_OINTMENT | Freq: Two times a day (BID) | CUTANEOUS | Status: DC
  Administered 2012-11-18 – 2012-11-19 (×2): 1 via NASAL
  Filled 2012-11-18: qty 22

## 2012-11-18 MED ORDER — PHENOL 1.4 % MT LIQD
1.0000 | OROMUCOSAL | Status: DC | PRN
  Administered 2012-11-18: 1 via OROMUCOSAL
  Filled 2012-11-18: qty 177

## 2012-11-18 MED ORDER — ACETAMINOPHEN 650 MG RE SUPP
650.0000 mg | Freq: Four times a day (QID) | RECTAL | Status: DC | PRN
  Administered 2012-11-18: 650 mg via RECTAL
  Filled 2012-11-18: qty 1

## 2012-11-18 MED ORDER — CHLORHEXIDINE GLUCONATE CLOTH 2 % EX PADS
6.0000 | MEDICATED_PAD | Freq: Every day | CUTANEOUS | Status: DC
  Administered 2012-11-19: 6 via TOPICAL

## 2012-11-18 NOTE — Progress Notes (Signed)
NEURO HOSPITALIST PROGRESS NOTE   SUBJECTIVE:                                                                                                                        Alert and following commands. No further seizures noted. He is intubated and sedated with versed and fentanyl. Keppra 500 mg IV Q 12. EEG unremarkable.   OBJECTIVE:                                                                                                                           Vital signs in last 24 hours: Temp:  [98 F (36.7 C)-100.7 F (38.2 C)] 100.7 F (38.2 C) (08/15 0000) Pulse Rate:  [69-91] 77 (08/15 0700) Resp:  [0-21] 20 (08/15 0400) BP: (98-122)/(64-83) 107/72 mmHg (08/15 0700) SpO2:  [97 %-100 %] 98 % (08/15 0700) FiO2 (%):  [30 %-40 %] 30 % (08/15 0700) Weight:  [120.5 kg (265 lb 10.5 oz)] 120.5 kg (265 lb 10.5 oz) (08/15 0300)  Intake/Output from previous day: 08/14 0701 - 08/15 0700 In: 2404.7 [I.V.:1485.7; IV Piggyback:919] Out: 2550 [Urine:2300; Emesis/NG output:250] Intake/Output this shift: Total I/O In: 40 [I.V.:40] Out: 150 [Urine:150] Nutritional status: NPO  Past Medical History  Diagnosis Date  . Back pain, chronic   . Seizures     Neurologic Exam:  Mental status: alert and awake and follows commands. CN 2-12: pupils 2-3 mm bilaterally, reactive. No gaze preference. EOM full without nystagmus. Face symmetric. tongue: intubated. Motor: moves all limbs spontaneously and symmetrically. Sensory: no tested. DTR's: 2+ all over. Plantars: downgoing. Coordination and gait: unable to test. No meningeal signs.  Lab Results: No results found for this basename: cbc, bmp, coags, chol, tri, ldl, hga1c   Lipid Panel No results found for this basename: CHOL, TRIG, HDL, CHOLHDL, VLDL, LDLCALC,  in the last 72 hours  Studies/Results: Ct Head Wo Contrast  11/17/2012   *RADIOLOGY REPORT*  Clinical Data:  Possible seizure.  History of falling.  CT  HEAD WITHOUT CONTRAST CT CERVICAL SPINE WITHOUT CONTRAST  Technique:  Multidetector CT imaging of the head and cervical spine was performed following the standard protocol without intravenous contrast.  Multiplanar CT image reconstructions of the cervical spine were also generated.  Comparison:  Head CT 10/21/2012.  CT HEAD  Findings: No acute intracranial abnormalities.  Specifically, no evidence of acute intracranial hemorrhage, no definite findings of acute/subacute cerebral ischemia, no mass, mass effect, hydrocephalus or abnormal intra or extra-axial fluid collections. No acute displaced skull fractures are identified.  Mastoids are well pneumatized bilaterally.  Extensive mucoperiosteal thickening throughout the paranasal sinuses, compatible with chronic sinusitis.  IMPRESSION: 1.  No acute intracranial abnormalities. 2.  Changes of chronic sinusitis throughout the paranasal sinuses.  CT CERVICAL SPINE  Findings: No acute displaced fractures of the cervical spine. Alignment is anatomic.  Prevertebral soft tissues appear normal. Visualized portions of the upper thorax are unremarkable.  IMPRESSION: No evidence of significant acute traumatic injury to the cervical spine.   Original Report Authenticated By: Trudie Reed, M.D.   Ct Cervical Spine Wo Contrast  11/17/2012   *RADIOLOGY REPORT*  Clinical Data:  Possible seizure.  History of falling.  CT HEAD WITHOUT CONTRAST CT CERVICAL SPINE WITHOUT CONTRAST  Technique:  Multidetector CT imaging of the head and cervical spine was performed following the standard protocol without intravenous contrast.  Multiplanar CT image reconstructions of the cervical spine were also generated.  Comparison:  Head CT 10/21/2012.  CT HEAD  Findings: No acute intracranial abnormalities.  Specifically, no evidence of acute intracranial hemorrhage, no definite findings of acute/subacute cerebral ischemia, no mass, mass effect, hydrocephalus or abnormal intra or extra-axial fluid  collections. No acute displaced skull fractures are identified.  Mastoids are well pneumatized bilaterally.  Extensive mucoperiosteal thickening throughout the paranasal sinuses, compatible with chronic sinusitis.  IMPRESSION: 1.  No acute intracranial abnormalities. 2.  Changes of chronic sinusitis throughout the paranasal sinuses.  CT CERVICAL SPINE  Findings: No acute displaced fractures of the cervical spine. Alignment is anatomic.  Prevertebral soft tissues appear normal. Visualized portions of the upper thorax are unremarkable.  IMPRESSION: No evidence of significant acute traumatic injury to the cervical spine.   Original Report Authenticated By: Trudie Reed, M.D.   Dg Chest Port 1 View  11/18/2012   *RADIOLOGY REPORT*  Clinical Data: Evaluate endotracheal tube  PORTABLE CHEST - 1 VIEW  Comparison: 11/17/2012  Findings: Retraction of the endotracheal tube with the tip now 9.1 cm above the carina at the level of the cervical thoracic junction.  Nasogastric tube courses below the diaphragm.  The tip is not included on this exam.  No gross pneumothorax.  No infiltrate or congestive heart failure.  Heart size within normal limits.  IMPRESSION:  Retraction of the endotracheal tube with the tip now 9.1 cm above the carina at the level of the cervical thoracic junction.   Original Report Authenticated By: Lacy Duverney, M.D.   Dg Chest Port 1 View  11/17/2012   *RADIOLOGY REPORT*  Clinical Data: Right upper lobe collapse  PORTABLE CHEST - 1 VIEW  Comparison: 11/17/2012 at 3:35 a.m.  Findings: The endotracheal tube is not significantly changed, with its tip 5.8 cm above the carina.  This could still be further inserted two to three more centimeters for more optimal positioning.  Nasogastric tube has been placed since the prior exam, passing below the diaphragm curling in the stomach.  The opacity noted along the right medial upper hemithorax consistent with right upper lobe collapse, is essentially resolved.  The lungs are clear.  No pleural effusion or pneumothorax.  IMPRESSION: Resolved right upper lobe atelectasis.  Nasogastric tube is well positioned curled in the stomach.  Endotracheal tube position is similar to the prior  exam as described above.   Original Report Authenticated By: Amie Portland, M.D.   Dg Chest Portable 1 View  11/17/2012   *RADIOLOGY REPORT*  Clinical Data: Seizure.  Endotracheal tube placement.  PORTABLE CHEST - 1 VIEW  Comparison: 10/20/2012.  Findings: High endotracheal tube, ending at the level of T1-T2 interspace.  The retention balloon is just below the larynx.  Low lung volumes.  There is right upper lobe collapse. No abnormality seen in the right hilum on previous radiography.  No evident effusion or pneumothorax.  These results were called by telephone on 11/18/2011 at 04:10 a.m. to Dr Theodoro Kalata, who verbally acknowledged these results.  IMPRESSION:  1.  High endotracheal tube, recommend advancing 2-3 cm. 2.  Right upper lobe collapse, likely from aspiration given seizures.   Original Report Authenticated By: Tiburcio Pea   Dg Abd Portable 1v  11/17/2012   *RADIOLOGY REPORT*  Clinical Data: Orogastric tube placement  PORTABLE ABDOMEN - 1 VIEW  Comparison: None.  Findings: Orogastric tube side port in the stomach. Bowel gas pattern is within normal limits.  No obstruction or free air is seen on this supine examination.  There are surgical clips in the right upper quadrant.  IMPRESSION: Orogastric tube tip and side port in the stomach. Nonspecific gas pattern.   Original Report Authenticated By: Bretta Bang, M.D.    MEDICATIONS                                                                                                                       I have reviewed the patient's current medications.  ASSESSMENT/PLAN:                                                                                                           Doing better, awake and following commands. Plan for  extubation today. Continue keppra. Will follow up  Wyatt Portela, MD [facility] [phone]  11/18/2012, 8:23 AM

## 2012-11-18 NOTE — Procedures (Signed)
Extubation Procedure Note  Patient Details:   Name: Brandon Blake DOB: 1980/11/04 MRN: [id number]   Airway Documentation:     Evaluation  O2 sats: stable throughout Complications: No apparent complications Patient did tolerate procedure well. Bilateral Breath Sounds: Clear;Diminished Suctioning: Airway (SUB 105) Yes  Toula Moos 11/18/2012, 12:38 PM

## 2012-11-18 NOTE — Progress Notes (Signed)
UR completed 

## 2012-11-18 NOTE — Progress Notes (Signed)
[facility] ADULT ICU REPLACEMENT PROTOCOL FOR AM LAB REPLACEMENT ONLY  The patient does apply for the [facility] Adult ICU Electrolyte Replacment Protocol based on the criteria listed below:   1. Is GFR >/= 40 ml/min? yes  Patient's GFR today is?90 2. Is urine output >/= 0.5 ml/kg/hr for the last 6 hours? yes Patient's UOP is 1.35 ml/kg/hr 3. Is BUN < 60 mg/dL? yes  Patient's BUN today is 7 4. Abnormal electrolyte(s) k 3.0, Mg 1.4 5. Ordered repletion with:see order 6. If Blake panic level lab has been reported, has the CCM MD in charge been notified? yes.   Physician:  Dr. Herma Carson (elink)  Brandon Blake, Brandon Blake 11/18/2012 6:48 AM

## 2012-11-18 NOTE — Progress Notes (Signed)
PULMONARY  / CRITICAL CARE MEDICINE  Name: Brandon Blake MRN: [id number] DOB: 1980-08-21    ADMISSION DATE:  11/16/2012 CONSULTATION DATE:  11/17/2012  REFERRING MD :  Dierdre Highman PRIMARY SERVICE: PCCM  CHIEF COMPLAINT:    BRIEF PATIENT DESCRIPTION: 32 y/o male was admitted through the [facility] ED on 8/14 after he presented with status epilepticus requiring intubation and multiple anti-epileptics.  SIGNIFICANT EVENTS / STUDIES:  8/14 admission 8/14 CT head > NAICP, sinusitis 8/14 CT spine > no evidence of acute trauma to cervical spine  LINES / TUBES: 8/14 ETT >>  CULTURES: 8/14 blood >> 8/14 resp >>  ANTIBIOTICS: 8/14 unasyn >>  HISTORY OF PRESENT ILLNESS:  32 y/o male was admitted through the [facility] ED on 8/14 after he presented with status epilepticus requiring intubation and multiple anti-epileptics.  He is intubated and no family is available to provide history.  Per chart review and discussing with [facility] staff, it appears that he has a history of seizure disorder and had been compliant with medications lately.  On 8/13 was found down by family in driveway seizing after he had been seen in his usual state of health minutes before.  In [facility] he had persistent seizures despite dilantin, keppra, and ativan.  He was intubated and started on a propofol drip on which he still had continued seizures.  PAST MEDICAL HISTORY :  Past Medical History  Diagnosis Date  . Back pain, chronic   . Seizures    Past Surgical History  Procedure Laterality Date  . Cholecystectomy     Prior to Admission medications   Medication Sig Start Date End Date Taking? Authorizing Provider  levETIRAcetam (KEPPRA) 500 MG tablet Take 500 mg by mouth 2 (two) times daily.    Yes Historical Provider, MD  oxyCODONE-acetaminophen (PERCOCET/ROXICET) 5-325 MG per tablet Take 1-2 tablets by mouth every 6 (six) hours as needed. 10/22/12  Yes Pamella Pert, MD  potassium chloride SA (K-DUR,KLOR-CON) 20 MEQ tablet  Take 2 tablets (40 mEq total) by mouth daily. 10/16/12  Yes Dorothea Ogle, MD   Allergies  Allergen Reactions  . Naproxen Hives and Rash    Swelling of hands     FAMILY HISTORY:  Family History  Problem Relation Age of Onset  . Cancer Father   . Diabetes Other   . Coronary artery disease Other    SOCIAL HISTORY:  reports that he has been smoking Cigarettes.  He has been smoking about 0.75 packs per day. He does not have any smokeless tobacco history on file. He reports that he does not drink alcohol or use illicit drugs. By report, some history of polysubtance abuse  REVIEW OF SYSTEMS:  Cannot obtain due to intubation  SUBJECTIVE:   VITAL SIGNS: Temp:  [98.4 F (36.9 C)-100.7 F (38.2 C)] 99.7 F (37.6 C) (08/15 1127) Pulse Rate:  [73-103] 87 (08/15 1300) Resp:  [0-23] 18 (08/15 1300) BP: (104-137)/(64-87) 130/86 mmHg (08/15 1300) SpO2:  [94 %-100 %] 97 % (08/15 1300) FiO2 (%):  [30 %] 30 % (08/15 1152) Weight:  [120.5 kg (265 lb 10.5 oz)] 120.5 kg (265 lb 10.5 oz) (08/15 0300) HEMODYNAMICS:   VENTILATOR SETTINGS: Vent Mode:  [-] PSV;CPAP FiO2 (%):  [30 %] 30 % Set Rate:  [20 bmp] 20 bmp Vt Set:  [620 mL] 620 mL PEEP:  [5 cmH20] 5 cmH20 Pressure Support:  [5 cmH20-8 cmH20] 8 cmH20 Plateau Pressure:  [15 cmH20-17 cmH20] 17 cmH20 INTAKE / OUTPUT: Intake/Output  08/14 0701 - 08/15 0700 08/15 0701 - 08/16 0700   I.V. (mL/kg) 1485.7 (12.3) 147 (1.2)   IV Piggyback 919 200   Total Intake(mL/kg) 2404.7 (20) 347 (2.9)   Urine (mL/kg/hr) 2300 (0.8) 510 (0.6)   Emesis/NG output 250 (0.1)    Total Output 2550 510   Net -145.3 -163          PHYSICAL EXAMINATION:  Gen: large male, sedated on vent HEENT: NCAT, PERRL, ETT in place PULM: rhonchi bilaterally CV: RRR, no mgr, no JVD AB: BS+, soft, nontender, no hsm Ext: warm, no edema, no clubbing, no cyanosis Derm: no rash or skin breakdown Neuro: sedated on vent, does not withdraw to pain, by 3100 RN report had a  seizure minutes before my arrival   LABS:  CBC Recent Labs     11/16/12  2350  11/17/12  1100  11/18/12  0425  WBC  10.9*  11.5*  11.8*  HGB  15.2  13.6  13.4  HCT  42.8  39.5  38.5*  PLT  202  171  166   Coag's No results found for this basename: APTT, INR,  in the last 72 hours BMET Recent Labs     11/16/12  2350  11/17/12  1100  11/18/12  0425  NA  137   --   137  K  3.7   --   3.0*  CL  104   --   103  CO2  27   --   24  BUN  11   --   7  CREATININE  0.72  0.72  0.65  GLUCOSE  103*   --   102*   Electrolytes Recent Labs     11/16/12  2350  11/18/12  0425  CALCIUM  9.5  8.5  MG   --   1.4*  PHOS   --   3.3   Sepsis Markers No results found for this basename: LACTICACIDVEN, PROCALCITON, O2SATVEN,  in the last 72 hours ABG Recent Labs     11/17/12  0336  11/17/12  0838  11/18/12  0328  PHART  7.315*  7.423  7.462*  PCO2ART  51.7*  35.6  34.6*  PO2ART  334.0*  86.0  70.4*   Liver Enzymes No results found for this basename: AST, ALT, ALKPHOS, BILITOT, ALBUMIN,  in the last 72 hours Cardiac Enzymes No results found for this basename: TROPONINI, PROBNP,  in the last 72 hours Glucose Recent Labs     11/16/12  2340  GLUCAP  110*   Imaging  CXR: low lung volumes, ETT in place, RUL collapse EKG: NSR, NO ST wave changes  ASSESSMENT / PLAN:  PULMONARY A:Acute respiratory failure due to inability to protect airway while post ictal RUL collapse from aspiration P:   - SBT to extubate today. - IS and flutter valve. - O2 as needed.Marland Kitchen  CARDIOVASCULAR A: No acute issues P:  - Tele. - No need for BP medications for now.  RENAL A:  No acute issues P:   - Monitor UOP. - BMET in AM.  GASTROINTESTINAL A:  No acute issues P:   - OG tube. - Pantoprazole for stress ulcer prophylaxis.  HEMATOLOGIC A:  No acute issues P:  - Lovenox for dvt prophylaxis.  INFECTIOUS A:  Aspiration pneumonia P:   - Unasyn per pharm. - Blood  culture/respiratory culture.  ENDOCRINE A:  No acute issues   P:   - Monitor glucose.  NEUROLOGIC  A:  Status epilepticus, no clear inciting event P:   - Versed gtt. - PRN ativan. - Keppra. - Dilantin. - Neurology consult appreciated.  TODAY'S SUMMARY: SBT to extubate today since neuro has completed their EEG.  I have personally obtained a history, examined the patient, evaluated laboratory and imaging results, formulated the assessment and plan and placed orders.  CRITICAL CARE: The patient is critically ill with multiple organ systems failure and requires high complexity decision making for assessment and support, frequent evaluation and titration of therapies, application of advanced monitoring technologies and extensive interpretation of multiple databases. Critical Care Time devoted to patient care services described in this note is 40 minutes.   Sherlynn Tourville,MD Pulmonary and Critical Care Medicine [facility] Pager: [phone]  11/18/2012, 1:57 PM

## 2012-11-19 LAB — BASIC METABOLIC PANEL
BUN: 6 mg/dL (ref 6–23)
Chloride: 100 mEq/L (ref 96–112)
GFR calc non Af Amer: >90 mL/min (ref >90)
Glucose, Bld: 90 mg/dL (ref 70–99)
Potassium: 3.2 mEq/L — ABNORMAL LOW (ref 3.5–5.1)

## 2012-11-19 LAB — CULTURE, RESPIRATORY W GRAM STAIN

## 2012-11-19 LAB — CBC
HCT: 40.5 % (ref 39.0–52.0)
Hemoglobin: 14.6 g/dL (ref 13.0–17.0)
MCH: 31.6 pg (ref 26.0–34.0)
MCHC: 36 g/dL (ref 30.0–36.0)

## 2012-11-19 MED ORDER — AMOXICILLIN-POT CLAVULANATE 875-125 MG PO TABS
1.0000 | ORAL_TABLET | Freq: Two times a day (BID) | ORAL | Status: DC
  Administered 2012-11-19: 1 via ORAL
  Filled 2012-11-19 (×2): qty 1

## 2012-11-19 MED ORDER — OXYCODONE HCL 5 MG PO TABS
10.0000 mg | ORAL_TABLET | ORAL | Status: DC | PRN
  Administered 2012-11-19 (×3): 10 mg via ORAL
  Filled 2012-11-19 (×3): qty 2

## 2012-11-19 MED ORDER — PHENYTOIN SODIUM EXTENDED 200 MG PO CAPS: 200.0000 mg | ORAL_CAPSULE | Freq: Two times a day (BID) | ORAL | Status: DC

## 2012-11-19 MED ORDER — LEVETIRACETAM 500 MG PO TABS: 500.0000 mg | ORAL_TABLET | Freq: Two times a day (BID) | ORAL | Status: DC

## 2012-11-19 MED ORDER — MORPHINE SULFATE 4 MG/ML IJ SOLN: 4.0000 mg | INTRAMUSCULAR | Status: DC | PRN

## 2012-11-19 MED ORDER — LEVETIRACETAM 500 MG PO TABS
500.0000 mg | ORAL_TABLET | Freq: Two times a day (BID) | ORAL | Status: DC
  Administered 2012-11-19: 500 mg via ORAL
  Filled 2012-11-19 (×2): qty 1

## 2012-11-19 MED ORDER — AMOXICILLIN-POT CLAVULANATE 875-125 MG PO TABS: 1.0000 | ORAL_TABLET | Freq: Two times a day (BID) | ORAL | Status: DC

## 2012-11-19 MED ORDER — PHENYTOIN SODIUM EXTENDED 100 MG PO CAPS: 200.0000 mg | ORAL_CAPSULE | Freq: Two times a day (BID) | ORAL | Status: DC

## 2012-11-19 MED ORDER — PHENYTOIN SODIUM EXTENDED 100 MG PO CAPS
200.0000 mg | ORAL_CAPSULE | Freq: Two times a day (BID) | ORAL | Status: DC
  Administered 2012-11-19: 200 mg via ORAL
  Filled 2012-11-19 (×2): qty 2

## 2012-11-19 NOTE — Progress Notes (Signed)
NEURO HOSPITALIST PROGRESS NOTE   SUBJECTIVE:                                                                                                                        Events of last night noted. Extubated.  Nursing staff tells me that he had a probable pseudoseizure last night. Complains of chest, back, and throat pain. Switched to PO keppra and dilantin.  OBJECTIVE:                                                                                                                           Vital signs in last 24 hours: Temp:  [97.6 F (36.4 C)-99.7 F (37.6 C)] 98.6 F (37 C) (08/16 0358) Pulse Rate:  [45-103] 63 (08/16 0700) Resp:  [12-23] 12 (08/16 0700) BP: (106-137)/(59-102) 110/68 mmHg (08/16 0700) SpO2:  [86 %-97 %] 95 % (08/16 0700) FiO2 (%):  [30 %] 30 % (08/15 1152) Weight:  [117 kg (257 lb 15 oz)] 117 kg (257 lb 15 oz) (08/16 0500)  Intake/Output from previous day: 08/15 0701 - 08/16 0700 In: 1942.1 [P.O.:1240; I.V.:302.1; IV Piggyback:400] Out: 4060 [Urine:4060] Intake/Output this shift:   Nutritional status: General  Past Medical History  Diagnosis Date  . Back pain, chronic   . Seizures     Neurologic Exam:  Mental status: alert, awake, oriented x4. Comprehension, naming, and repetition intact. No dysarthria or dysphasia. CN 2-12: intact. Motor: moves all extremities spontaneously and symmetrically. Sensory: no tested. DTRs: 1+ all over Plantars: downgoing Coordination and gait: no tested.  Lab Results: No results found for this basename: cbc, bmp, coags, chol, tri, ldl, hga1c   Lipid Panel No results found for this basename: CHOL, TRIG, HDL, CHOLHDL, VLDL, LDLCALC,  in the last 72 hours  Studies/Results: Dg Chest Port 1 View  11/18/2012   *RADIOLOGY REPORT*  Clinical Data: Evaluate endotracheal tube  PORTABLE CHEST - 1 VIEW  Comparison: 11/17/2012  Findings: Retraction of the endotracheal tube with the tip now 9.1  cm above the carina at the level of the cervical thoracic junction.  Nasogastric tube courses below the diaphragm.  The tip is not included on this exam.  No gross pneumothorax.  No infiltrate or congestive heart failure.  Heart size within normal limits.  IMPRESSION:  Retraction of the endotracheal tube with the tip now 9.1 cm above the carina at the level of the cervical thoracic junction.   Original Report Authenticated By: Lacy Duverney, M.D.   Dg Chest Port 1 View  11/17/2012   *RADIOLOGY REPORT*  Clinical Data: Right upper lobe collapse  PORTABLE CHEST - 1 VIEW  Comparison: 11/17/2012 at 3:35 a.m.  Findings: The endotracheal tube is not significantly changed, with its tip 5.8 cm above the carina.  This could still be further inserted two to three more centimeters for more optimal positioning.  Nasogastric tube has been placed since the prior exam, passing below the diaphragm curling in the stomach.  The opacity noted along the right medial upper hemithorax consistent with right upper lobe collapse, is essentially resolved. The lungs are clear.  No pleural effusion or pneumothorax.  IMPRESSION: Resolved right upper lobe atelectasis.  Nasogastric tube is well positioned curled in the stomach.  Endotracheal tube position is similar to the prior exam as described above.   Original Report Authenticated By: Amie Portland, M.D.   Dg Abd Portable 1v  11/17/2012   *RADIOLOGY REPORT*  Clinical Data: Orogastric tube placement  PORTABLE ABDOMEN - 1 VIEW  Comparison: None.  Findings: Orogastric tube side port in the stomach. Bowel gas pattern is within normal limits.  No obstruction or free air is seen on this supine examination.  There are surgical clips in the right upper quadrant.  IMPRESSION: Orogastric tube tip and side port in the stomach. Nonspecific gas pattern.   Original Report Authenticated By: Bretta Bang, M.D.    MEDICATIONS                                                                                                                        I have reviewed the patient's current medications.  ASSESSMENT/PLAN:                                                                                                            Doing well from a neurological standpoint. Patient doesn't seem to need NICU care at this moment and agree that he can be transfer out of the unit. I had an ample conversation with Mr. Mathes and his father regarding his seizures. 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 Advised him to speak  with his neurologist or his primary care physician to make such arrangements.   Wyatt Portela, MD [facility] [phone]  11/19/2012, 8:59 AM

## 2012-11-19 NOTE — Progress Notes (Signed)
Discharge orders and instructions were received. Discharge instructions and medication instructions were provided to patient and his wife. Pt refused to pay attention to discharge instructions, wife acknowledged instructions and did not have any further questions regarding treatment and plan of care. Pt and wife were instructed on doses already received and instructed next medications would be this evening. Once again wife acknowledged instructions and repeated back. Instructed to return if symptoms return or worsen. Pt was dressed and brought to waiting family vehicle without any complications. Family and patient declined further assistance from nursing staff at this time.

## 2012-11-19 NOTE — Progress Notes (Signed)
[facility] Physician-Brief Progress Note Patient Name: Brandon Blake DOB: 02-06-1981 MRN: [id number]  Date of Service  11/19/2012   HPI/Events of Note  Called by nurse that pt wanted to leave [facility]a   eICU Interventions  Spoke to pt, he stated that he wanted to go home to be with his kids (it was 1:30am) I informed pt that we would treat his pain, that he was still being treated for pna and that he had required intubation to control his seizure.  He had already removed his IV.  I have converted his seizure medications to PO and changed IV unasyn to augmentin. Pt agreed to stay for now.       GIDDINGS, OLIVIA K. 11/19/2012, 2:16 AM

## 2012-11-19 NOTE — Progress Notes (Signed)
[facility] ADULT ICU REPLACEMENT PROTOCOL FOR AM LAB REPLACEMENT ONLY  The patient does not apply for the [facility] Adult ICU Electrolyte Replacment Protocol based on the criteria listed below:   1. Is GFR >/= 40 ml/min? yes  Patient's GFR today is >90 2. Is urine output >/= 0.5 ml/kg/hr for the last 6 hours? no Patient's UOP is  Ml/kg/hr    NO UOP recorded 3. Is BUN < 60 mg/dL? yes  Patient's BUN today is 6 4. Abnormal electrolyte(s): K 3.2 5. Ordered repletion with: NA 6. If a panic level lab has been reported, has the CCM MD in charge been notified? yes.   Physician:  Dr Nicholaus Corolla, Anacaren Kohan A 11/19/2012 6:34 AM

## 2012-11-19 NOTE — Discharge Summary (Signed)
  DISCHARGE SUMMARY  DATE OF ADMISSION:  8/14  DATE OF DISCHARGE:  8/16  ADMISSION DIAGNOSES:   Status epilepticus Acute respiratory failure due to inability to protect airway while post ictal  RUL collapse from aspiration hypokalemia   DISCHARGE DIAGNOSES:   Status epilepticus, resolved Acute respiratory failure, resolved RUL collapse from aspiration, resolved Hypokalemia, repleted Seizure disorder  PRESENTATION:  He was admitted with the above diagnoses and the following HPI: 32 y/o male was admitted through the [facility] ED on 8/14 after he presented with status epilepticus requiring intubation and multiple anti-epileptics. He is intubated and no family is available to provide history. Per chart review and discussing with [facility] staff, it appears that he has a history of seizure disorder and had been compliant with medications lately. On 8/13 was found down by family in driveway seizing after he had been seen in his usual state of health minutes before. In the [facility] ED he had persistent seizures despite dilantin, keppra, and ativan. He was intubated and started on a propofol drip on which he still had continued seizures.   HOSPITAL COURSE:   He was supported overnight on mechanical ventilation. He was extubated on the morning after admission. He was seen in consultation by Neurology with the following assessment and recommendations:  32 years old with a history of seizures and polysubstance abuse, transferred to [facility] for further management of GTC SE.  Currently sedated on propofol, versed, fentanyl. Keppra 500 mg IV Q 12 and dilantin 200 MG iv q 12.  Recommend:  1) continuous EEG monitoring to address for possible non convulsive SE  2) STAT dilantin level ( keep high therapeutic level)  3) Continue current AEDs.  Hyperkalemia was treated with repletion therapies. His CXR normalized and PNA was deemed unlikely.  He had no further seizures while hospitalized and was deemed ready for  discharge to home on 8/16 AM  The following procedures, studies were performed during this hospitalization: SIGNIFICANT EVENTS / STUDIES:  8/14 admission  8/14 CT head > NAD, sinusitis  8/14 CT spine > no evidence of acute trauma to cervical spine  LINES / TUBES:  8/14 ETT >> 8/15 CULTURES:  8/14 MRSA nasal swab >> POS 8/14 blood >> ngtd 8/14 resp >> rare GPC pairs ANTIBIOTICS:  8/14 unasyn >> 8/15 Augmentin 8/15 >> 8/16  DISCHARGE MEDS: levETIRAcetam (KEPPRA) 500 MG tablet Take 500 mg by mouth 2 (two) times daily oxyCODONE-acetaminophen (PERCOCET/ROXICET) 5-325 MG per tablet Take 1-2 tablets by mouth every 6 (six) hours as needed. potassium chloride SA (K-DUR,KLOR-CON) 20 MEQ tablet Take 2 tablets (40 mEq total) by mouth daily. phenytoin (DILANTIN) 200 MG ER capsule Take 1 capsule (200 mg total) by mouth 2 (two) times daily. Merwyn Katos, MD Prescribed    Billy Fischer, MD;  PCCM service; Mobile [phone]

## 2012-11-23 LAB — CULTURE, BLOOD (ROUTINE X 2)
Culture: NO GROWTH
Culture: NO GROWTH

## 2013-02-14 ENCOUNTER — Emergency Department (HOSPITAL_COMMUNITY)
Admission: EM | Admit: 2013-02-14 | Discharge: 2013-02-14 | Disposition: A | Discharge status: Home / Self Care | Payer: Medicaid Other | Attending: Emergency Medicine | Admitting: Emergency Medicine

## 2013-02-14 ENCOUNTER — Encounter (HOSPITAL_COMMUNITY): Payer: Self-pay | Admitting: Emergency Medicine

## 2013-02-14 DIAGNOSIS — Z79899 Other long term (current) drug therapy: Secondary | ICD-10-CM | POA: Insufficient documentation

## 2013-02-14 DIAGNOSIS — F17200 Nicotine dependence, unspecified, uncomplicated: Secondary | ICD-10-CM | POA: Insufficient documentation

## 2013-02-14 DIAGNOSIS — G8929 Other chronic pain: Secondary | ICD-10-CM | POA: Insufficient documentation

## 2013-02-14 DIAGNOSIS — G40909 Epilepsy, unspecified, not intractable, without status epilepticus: Secondary | ICD-10-CM | POA: Insufficient documentation

## 2013-02-14 DIAGNOSIS — M545 Low back pain, unspecified: Secondary | ICD-10-CM | POA: Insufficient documentation

## 2013-02-14 MED ORDER — OXYCODONE-ACETAMINOPHEN 5-325 MG PO TABS
2.0000 | ORAL_TABLET | Freq: Once | ORAL | Status: AC
  Administered 2013-02-14: 2 via ORAL
  Filled 2013-02-14: qty 2

## 2013-02-14 MED ORDER — OXYCODONE-ACETAMINOPHEN 5-325 MG PO TABS: 1.0000 | ORAL_TABLET | Freq: Four times a day (QID) | ORAL | Status: DC | PRN

## 2013-02-14 MED ORDER — METHOCARBAMOL 500 MG PO TABS: 500.0000 mg | ORAL_TABLET | Freq: Two times a day (BID) | ORAL | Status: DC

## 2013-02-14 MED ORDER — IBUPROFEN 600 MG PO TABS: 600.0000 mg | ORAL_TABLET | Freq: Four times a day (QID) | ORAL | Status: DC | PRN

## 2013-02-14 MED ORDER — METHOCARBAMOL 500 MG PO TABS
500.0000 mg | ORAL_TABLET | Freq: Once | ORAL | Status: AC
  Administered 2013-02-14: 500 mg via ORAL
  Filled 2013-02-14: qty 1

## 2013-02-14 NOTE — ED Notes (Signed)
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

## 2013-02-14 NOTE — ED Provider Notes (Signed)
Medical screening examination/treatment/procedure(s) were performed by non-physician practitioner and as supervising physician I was immediately available for consultation/collaboration.  EKG Interpretation   None         William Pamelyn Bancroft, MD 02/14/13 1521 

## 2013-02-14 NOTE — ED Provider Notes (Signed)
CSN: [id number]     Arrival date & time 02/14/13  1036 History   First MD Initiated Contact with Patient 02/14/13 1058     Chief Complaint  Patient presents with  . Back Pain   (Consider location/radiation/quality/duration/timing/severity/associated sxs/prior Treatment) HPI Comments: Patient with a history of chronic back pain presents today with a chief complaint of lower back pain.  Pain worsened yesterday while lifting heavy boxes.  He reports that the pain does not radiate.  Movement makes the pain worse.  Pain not associated with any numbness or tingling.  No bowel or bladder incontinence.  No fever or chills.  He reports that he works for [employer] and does a lot of bending and lifting at work.  He has had his back pain flare up like this in the past.  He has been taking Aleve and Ibuprofen for the pain without relief.  He denies history of IVDU or Cancer.  The history is provided by the patient.    Past Medical History  Diagnosis Date  . Back pain, chronic   . Seizures    Past Surgical History  Procedure Laterality Date  . Cholecystectomy     Family History  Problem Relation Age of Onset  . Cancer Father   . Diabetes Other   . Coronary artery disease Other    History  Substance Use Topics  . Smoking status: Current Every Day Smoker -- 0.75 packs/day    Types: Cigarettes  . Smokeless tobacco: Not on file  . Alcohol Use: No    Review of Systems  Musculoskeletal: Positive for back pain.  All other systems reviewed and are negative.    Allergies  Naproxen  Home Medications   Current Outpatient Rx  Name  Route  Sig  Dispense  Refill  . levETIRAcetam (KEPPRA) 500 MG tablet   Oral   Take 500 mg by mouth 2 (two) times daily.          . phenytoin (DILANTIN) 200 MG ER capsule   Oral   Take 1 capsule (200 mg total) by mouth 2 (two) times daily.   30 capsule   5    BP 114/97  Pulse 91  Temp(Src) 98.2 F (36.8 C) (Oral)  Resp 18  Ht 6' (1.829 m)  Wt 265 lb  (120.203 kg)  BMI 35.93 kg/m2  SpO2 96% Physical Exam  Nursing note reviewed. Constitutional: He appears well-developed and well-nourished.  HENT:  Head: Normocephalic and atraumatic.  Mouth/Throat: Oropharynx is clear and moist.  Neck: Normal range of motion. Neck supple.  Cardiovascular: Normal rate, regular rhythm and normal heart sounds.   Pulmonary/Chest: Effort normal and breath sounds normal.  Musculoskeletal: Normal range of motion.  Neurological: He is alert. He has normal strength. No sensory deficit. Gait normal.  Reflex Scores:      Patellar reflexes are 2+ on the right side and 2+ on the left side.      Achilles reflexes are 2+ on the right side and 2+ on the left side. Skin: Skin is warm and dry. No erythema.  Psychiatric: He has a normal mood and affect.    ED Course  Procedures (including critical care time) Labs Review Labs Reviewed - No data to display Imaging Review No results found.  EKG Interpretation   None       MDM  No diagnosis found. Patient with chronic back pain.  No neurological deficits and normal neuro exam.  Patient can walk but states is painful.  No loss of bowel or bladder control.  No concern for cauda equina.  No fever, night sweats, weight loss, h/o cancer, IVDU.  RICE protocol and pain medicine indicated and discussed with patient.  Patient stable for discharge.  Return precautions given.     Santiago Glad, PA-C 02/14/13 1235

## 2013-02-20 ENCOUNTER — Emergency Department (HOSPITAL_COMMUNITY): Payer: Medicaid Other

## 2013-02-20 ENCOUNTER — Encounter (HOSPITAL_COMMUNITY): Payer: Self-pay | Admitting: Emergency Medicine

## 2013-02-20 ENCOUNTER — Emergency Department (HOSPITAL_COMMUNITY)
Admission: EM | Admit: 2013-02-20 | Discharge: 2013-02-20 | Disposition: A | Discharge status: Home / Self Care | Payer: Medicaid Other | Attending: Emergency Medicine | Admitting: Emergency Medicine

## 2013-02-20 DIAGNOSIS — G40909 Epilepsy, unspecified, not intractable, without status epilepticus: Secondary | ICD-10-CM | POA: Insufficient documentation

## 2013-02-20 DIAGNOSIS — M25569 Pain in unspecified knee: Secondary | ICD-10-CM | POA: Insufficient documentation

## 2013-02-20 DIAGNOSIS — Z79899 Other long term (current) drug therapy: Secondary | ICD-10-CM | POA: Insufficient documentation

## 2013-02-20 DIAGNOSIS — M25562 Pain in left knee: Secondary | ICD-10-CM

## 2013-02-20 DIAGNOSIS — L0390 Cellulitis, unspecified: Secondary | ICD-10-CM

## 2013-02-20 DIAGNOSIS — L02419 Cutaneous abscess of limb, unspecified: Secondary | ICD-10-CM | POA: Insufficient documentation

## 2013-02-20 DIAGNOSIS — F17200 Nicotine dependence, unspecified, uncomplicated: Secondary | ICD-10-CM | POA: Insufficient documentation

## 2013-02-20 DIAGNOSIS — G8929 Other chronic pain: Secondary | ICD-10-CM | POA: Insufficient documentation

## 2013-02-20 MED ORDER — SULFAMETHOXAZOLE-TRIMETHOPRIM 800-160 MG PO TABS: 2.0000 | ORAL_TABLET | Freq: Two times a day (BID) | ORAL | Status: DC

## 2013-02-20 MED ORDER — IBUPROFEN 800 MG PO TABS: 800.0000 mg | ORAL_TABLET | Freq: Three times a day (TID) | ORAL | Status: DC

## 2013-02-20 NOTE — ED Notes (Signed)
Pt c/o of left knee pain. Denies injury. States that he doesn't know what happen.

## 2013-02-20 NOTE — ED Provider Notes (Signed)
CSN: [id number]     Arrival date & time 02/20/13  3086 History   First MD Initiated Contact with Patient 02/20/13 1027     Chief Complaint  Patient presents with  . Knee Pain   (Consider location/radiation/quality/duration/timing/severity/associated sxs/prior Treatment) HPI Comments: Patient presents with a chief complaint of left knee pain.  Pain has been present for the past 4 days.  Pain gradually worsening.  He has been taking OTC medications for pain with little relief.  He reports that he began having the pain after he was clearing bushes outside.  He reports that yesterday he noticed some redness over the anterior knee.  He reports that he is able to move the knee, but has increased pain with ROM.  He denies any known trauma to the area.  He denies fever, chills, nausea, or vomiting.  No history of DM.  The history is provided by the patient.    Past Medical History  Diagnosis Date  . Back pain, chronic   . Seizures    Past Surgical History  Procedure Laterality Date  . Cholecystectomy     Family History  Problem Relation Age of Onset  . Cancer Father   . Diabetes Other   . Coronary artery disease Other    History  Substance Use Topics  . Smoking status: Current Every Day Smoker -- 0.75 packs/day    Types: Cigarettes  . Smokeless tobacco: Not on file  . Alcohol Use: No    Review of Systems  Musculoskeletal:       Left knee pain  All other systems reviewed and are negative.    Allergies  Naproxen  Home Medications   Current Outpatient Rx  Name  Route  Sig  Dispense  Refill  . ibuprofen (ADVIL,MOTRIN) 600 MG tablet   Oral   Take 600 mg by mouth every 6 (six) hours as needed for mild pain or moderate pain.         Marland Kitchen levETIRAcetam (KEPPRA) 500 MG tablet   Oral   Take 500 mg by mouth 2 (two) times daily.          . methocarbamol (ROBAXIN) 500 MG tablet   Oral   Take 500 mg by mouth every 6 (six) hours as needed for muscle spasms.         Marland Kitchen  oxyCODONE-acetaminophen (PERCOCET/ROXICET) 5-325 MG per tablet   Oral   Take 1-2 tablets by mouth every 6 (six) hours as needed for severe pain.   20 tablet   0   . phenytoin (DILANTIN) 200 MG ER capsule   Oral   Take 1 capsule (200 mg total) by mouth 2 (two) times daily.   30 capsule   5    BP 112/77  Pulse 83  Temp(Src) 99.4 F (37.4 C) (Oral)  Resp 16  SpO2 98% Physical Exam  Nursing note and vitals reviewed. Constitutional: He appears well-developed and well-nourished.  HENT:  Head: Normocephalic and atraumatic.  Mouth/Throat: Oropharynx is clear and moist.  Neck: Normal range of motion. Neck supple.  Cardiovascular: Normal rate, regular rhythm and normal heart sounds.   Pulses:      Dorsalis pedis pulses are 2+ on the right side, and 2+ on the left side.  Pulmonary/Chest: Effort normal and breath sounds normal.  Musculoskeletal:       Left knee: He exhibits erythema. He exhibits normal range of motion, no swelling, no effusion and no deformity.  Full ROM of the left knee, but increased  pain with ROM Area of erythema and warmth overlying the left knee. Area tender to palpation.  No fluctuance.   No swelling of the left knee.  Neurological: He is alert.  Skin: Skin is warm and dry.  Psychiatric: He has a normal mood and affect.    ED Course  Procedures (including critical care time) Labs Review Labs Reviewed - No data to display Imaging Review Dg Knee Complete 4 Views Left  02/20/2013   CLINICAL DATA:  Pain  EXAM: LEFT KNEE - COMPLETE 4+ VIEW  COMPARISON:  None.  FINDINGS: Frontal, lateral, and bilateral oblique views were obtained. There is no fracture, dislocation, or effusion. No erosive change. No soft tissue calcification or abscess seen on this study.  IMPRESSION: No abnormality noted.   Electronically Signed   By: Bretta Bang M.D.   On: 02/20/2013 11:31    EKG Interpretation   None       Patient discussed with Dr. Lynelle Doctor  MDM  No diagnosis  found. Patient presents today with a chief complaint of left knee pain.  Erythema over the anterior aspect of the knee visualized.  Patient is afebrile.  He has full ROM of the knee.  No swelling of the knee.  Therefore, doubt septic joint.  Xray negative with no effusion.  Feel that the patient is stable for discharge.  Patient discharged home with Septra antibiotic and NSAIDs.  Strict return precautions given.    Santiago Glad, PA-C 02/20/13 1344  Santiago Glad, PA-C 02/20/13 1345

## 2013-02-20 NOTE — ED Notes (Signed)
Bed: WA24 Expected date:  Expected time:  Means of arrival:  Comments: 

## 2013-02-21 NOTE — ED Provider Notes (Signed)
Medical screening examination/treatment/procedure(s) were performed by non-physician practitioner and as supervising physician I was immediately available for consultation/collaboration.  EKG Interpretation   None       Devoria Albe, MD, Armando Gang   Ward Givens, MD 02/21/13 [phone]

## 2013-04-13 ENCOUNTER — Emergency Department (HOSPITAL_COMMUNITY): Payer: Medicaid Other

## 2013-04-13 ENCOUNTER — Encounter (HOSPITAL_COMMUNITY): Payer: Self-pay | Admitting: Emergency Medicine

## 2013-04-13 ENCOUNTER — Emergency Department (HOSPITAL_COMMUNITY)
Admission: EM | Admit: 2013-04-13 | Discharge: 2013-04-14 | Disposition: A | Discharge status: Home / Self Care | Payer: Medicaid Other | Attending: Emergency Medicine | Admitting: Emergency Medicine

## 2013-04-13 DIAGNOSIS — R55 Syncope and collapse: Secondary | ICD-10-CM | POA: Insufficient documentation

## 2013-04-13 DIAGNOSIS — F17200 Nicotine dependence, unspecified, uncomplicated: Secondary | ICD-10-CM | POA: Insufficient documentation

## 2013-04-13 DIAGNOSIS — G8929 Other chronic pain: Secondary | ICD-10-CM | POA: Insufficient documentation

## 2013-04-13 DIAGNOSIS — R569 Unspecified convulsions: Secondary | ICD-10-CM

## 2013-04-13 DIAGNOSIS — Z79899 Other long term (current) drug therapy: Secondary | ICD-10-CM | POA: Insufficient documentation

## 2013-04-13 DIAGNOSIS — G40909 Epilepsy, unspecified, not intractable, without status epilepticus: Secondary | ICD-10-CM | POA: Insufficient documentation

## 2013-04-13 LAB — CBC WITH DIFFERENTIAL/PLATELET
Basophils Absolute: 0 10*3/uL (ref 0.0–0.1)
Basophils Relative: 0 % (ref 0–1)
Eosinophils Absolute: 0.2 10*3/uL (ref 0.0–0.7)
Eosinophils Relative: 2 % (ref 0–5)
HCT: 41.2 % (ref 39.0–52.0)
Hemoglobin: 14.3 g/dL (ref 13.0–17.0)
Lymphocytes Relative: 28 % (ref 12–46)
Lymphs Abs: 2.9 10*3/uL (ref 0.7–4.0)
MCH: 31.6 pg (ref 26.0–34.0)
MCHC: 34.7 g/dL (ref 30.0–36.0)
MCV: 91.2 fL (ref 78.0–100.0)
Monocytes Absolute: 1.2 10*3/uL — ABNORMAL HIGH (ref 0.1–1.0)
Monocytes Relative: 12 % (ref 3–12)
Neutro Abs: 5.9 10*3/uL (ref 1.7–7.7)
Neutrophils Relative %: 58 % (ref 43–77)
Platelets: 202 10*3/uL (ref 150–400)
RBC: 4.52 MIL/uL (ref 4.22–5.81)
RDW: 12.3 % (ref 11.5–15.5)
WBC: 10.3 10*3/uL (ref 4.0–10.5)

## 2013-04-13 LAB — BASIC METABOLIC PANEL
BUN: 13 mg/dL (ref 6–23)
CO2: 28 mEq/L (ref 19–32)
Calcium: 8.8 mg/dL (ref 8.4–10.5)
Chloride: 101 mEq/L (ref 96–112)
Creatinine, Ser: 0.81 mg/dL (ref 0.50–1.35)
GFR calc Af Amer: >90 mL/min (ref >90)
GFR calc non Af Amer: >90 mL/min (ref >90)
Glucose, Bld: 93 mg/dL (ref 70–99)
Potassium: 3.5 mEq/L — ABNORMAL LOW (ref 3.7–5.3)
Sodium: 139 mEq/L (ref 137–147)

## 2013-04-13 LAB — GLUCOSE, CAPILLARY: Glucose-Capillary: 101 mg/dL — ABNORMAL HIGH (ref 70–99)

## 2013-04-13 LAB — PHENYTOIN LEVEL, TOTAL: Phenytoin Lvl: <2.5 ug/mL — ABNORMAL LOW (ref 10.0–20.0)

## 2013-04-13 MED ORDER — SODIUM CHLORIDE 0.9 % IV BOLUS (SEPSIS)
1000.0000 mL | Freq: Once | INTRAVENOUS | Status: AC
  Administered 2013-04-13: 1000 mL via INTRAVENOUS

## 2013-04-13 MED ORDER — LORAZEPAM 2 MG/ML IJ SOLN
INTRAMUSCULAR | Status: AC
  Filled 2013-04-13: qty 1

## 2013-04-13 MED ORDER — SODIUM CHLORIDE 0.9 % IV SOLN
500.0000 mg | Freq: Once | INTRAVENOUS | Status: AC
  Administered 2013-04-13: 500 mg via INTRAVENOUS
  Filled 2013-04-13: qty 10

## 2013-04-13 MED ORDER — LORAZEPAM 2 MG/ML IJ SOLN
1.0000 mg | Freq: Once | INTRAMUSCULAR | Status: AC
  Administered 2013-04-13: 1 mg via INTRAVENOUS

## 2013-04-13 NOTE — ED Notes (Signed)
Bed: WA21 Expected date: 04/13/13 Expected time: 7:35 PM Means of arrival: Ambulance Comments: seizure

## 2013-04-13 NOTE — ED Notes (Signed)
Per EMS report: pt forgot to take seizure medications and reports that he has hallucinations.  Pt was walking and had a syncopal episode and fell to the ground.  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  During EMS transport, EMS noted pt having a "seizure" but was after the seizure, the pt a/o x4 with no evidence of post-ictal symptoms. Pt a/o x 4 here.

## 2013-04-13 NOTE — ED Notes (Signed)
Family called Clinical research associatewriter into the room in which the pt was very still. Pt's eye's were rapidly moving.  Pt was not responding to verbal or painful stimuli.  Pt's family reports that pt was experiencing a seizure.  Ebbie Ridgehris Lawyer, PA notified.

## 2013-04-14 MED ORDER — HYDROCODONE-ACETAMINOPHEN 5-325 MG PO TABS
1.0000 | ORAL_TABLET | Freq: Once | ORAL | Status: AC
  Administered 2013-04-14: 1 via ORAL
  Filled 2013-04-14: qty 1

## 2013-04-14 NOTE — ED Provider Notes (Signed)
CSN: [id number]     Arrival date & time 04/13/13  1947 History   First MD Initiated Contact with Patient 04/13/13 2000     Chief Complaint  Patient presents with  . Near Syncope  . Seizures   (Consider location/radiation/quality/duration/timing/severity/associated sxs/prior Treatment) HPI Patient presents to the emergency department following a seizure that occurred just prior to arrival.  The patient did not take his seizure medications today.  Patient, states it's been several months, since his last seizure.  Patient, states, that he did not have chest pain, shortness of breath, headache, blurred vision, weakness, dizziness, nausea, vomiting, abdominal pain, or fever.  Patient, states, that he did not take any other medications prior to arrival Past Medical History  Diagnosis Date  . Back pain, chronic   . Seizures    Past Surgical History  Procedure Laterality Date  . Cholecystectomy     Family History  Problem Relation Age of Onset  . Cancer Father   . Diabetes Other   . Coronary artery disease Other    History  Substance Use Topics  . Smoking status: Current Every Day Smoker -- 0.50 packs/day    Types: Cigarettes  . Smokeless tobacco: Not on file  . Alcohol Use: No    Review of Systems All other systems negative except as documented in the HPI. All pertinent positives and negatives as reviewed in the HPI. Allergies  Naproxen  Home Medications   Current Outpatient Rx  Name  Route  Sig  Dispense  Refill  . levETIRAcetam (KEPPRA) 500 MG tablet   Oral   Take 1,000 mg by mouth 2 (two) times daily.          . phenytoin (DILANTIN) 200 MG ER capsule   Oral   Take 1 capsule (200 mg total) by mouth 2 (two) times daily.   30 capsule   5    BP 99/60  Pulse 72  Temp(Src) 98.7 F (37.1 C) (Oral)  Resp 16  SpO2 97% Physical Exam  Nursing note and vitals reviewed. Constitutional: He is oriented to person, place, and time. He appears well-developed and  well-nourished. No distress.  HENT:  Head: Normocephalic and atraumatic.  Mouth/Throat: Oropharynx is clear and moist.  Eyes: Pupils are equal, round, and reactive to light.  Neck: Normal range of motion. Neck supple.  Cardiovascular: Normal rate, regular rhythm and normal heart sounds.  Exam reveals no gallop and no friction rub.   No murmur heard. Pulmonary/Chest: Effort normal and breath sounds normal. No respiratory distress.  Neurological: He is alert and oriented to person, place, and time. He exhibits normal muscle tone. Coordination normal.  Skin: Skin is warm and dry. No rash noted.    ED Course  Procedures (including critical care time) Labs Review Labs Reviewed  GLUCOSE, CAPILLARY - Abnormal; Notable for the following:    Glucose-Capillary 101 (*)    All other components within normal limits  CBC WITH DIFFERENTIAL - Abnormal; Notable for the following:    Monocytes Absolute 1.2 (*)    All other components within normal limits  BASIC METABOLIC PANEL - Abnormal; Notable for the following:    Potassium 3.5 (*)    All other components within normal limits  PHENYTOIN LEVEL, TOTAL - Abnormal; Notable for the following:    Phenytoin Lvl <2.5 (*)    All other components within normal limits   Imaging Review Ct Head Wo Contrast  04/13/2013   CLINICAL DATA:  Syncope, possible seizure.  EXAM: CT  HEAD WITHOUT CONTRAST  TECHNIQUE: Contiguous axial images were obtained from the base of the skull through the vertex without intravenous contrast.  COMPARISON:  CT of the head November 17, 2012. MRI of the brain May 11, 2012.  FINDINGS: The ventricles and sulci are normal. No intraparenchymal hemorrhage, mass effect nor midline shift. Stable subcentimeter focus of hypodensity adjacent left frontal horn previously characterized as gliosis. No acute large vascular territory infarcts.  No abnormal extra-axial fluid collections. Basal cisterns are patent.  No skull fracture. Partially imaged  paranasal sinus mucosal thickening, improved from prior examination without air-fluid levels. Mastoid air cells are well aerated. . The included ocular globes and orbital contents are non-suspicious.  IMPRESSION: No acute intracranial process.  Mildly improved chronic paranasal sinusitis.   Electronically Signed   By: Awilda Metro   On: 04/13/2013 21:02    Patient had an episode here in the emergency department, where he was not responding, but no Tonic-clonic activity patient is given Dilantin IV, along with Ativan.  Patient is improved since this time.  Patient is advised followup with his neurologist.  He is also to take his medicines as directed.  Is told to return here as needed.    Carlyle Dolly, PA-C 04/14/13 [phone]

## 2013-04-14 NOTE — Discharge Instructions (Signed)
Follow-up with your neurologist.  Return here as needed °

## 2013-04-14 NOTE — ED Notes (Signed)
Pt able to ambulate without difficulty.  Thayer Ohmhris, [place] made aware.

## 2013-04-18 NOTE — ED Provider Notes (Signed)
Medical screening examination/treatment/procedure(s) were performed by non-physician practitioner and as supervising physician I was immediately available for consultation/collaboration.    Celene KrasJon R Tamilyn Lupien, MD 04/18/13 [phone]

## 2013-04-19 ENCOUNTER — Encounter (HOSPITAL_COMMUNITY): Payer: Self-pay | Admitting: Emergency Medicine

## 2013-04-19 ENCOUNTER — Emergency Department (HOSPITAL_COMMUNITY)
Admission: EM | Admit: 2013-04-19 | Discharge: 2013-04-19 | Disposition: A | Discharge status: Home / Self Care | Payer: Medicaid Other | Attending: Emergency Medicine | Admitting: Emergency Medicine

## 2013-04-19 DIAGNOSIS — F17200 Nicotine dependence, unspecified, uncomplicated: Secondary | ICD-10-CM | POA: Insufficient documentation

## 2013-04-19 DIAGNOSIS — Y9389 Activity, other specified: Secondary | ICD-10-CM | POA: Insufficient documentation

## 2013-04-19 DIAGNOSIS — R21 Rash and other nonspecific skin eruption: Secondary | ICD-10-CM | POA: Insufficient documentation

## 2013-04-19 DIAGNOSIS — Z79899 Other long term (current) drug therapy: Secondary | ICD-10-CM | POA: Insufficient documentation

## 2013-04-19 DIAGNOSIS — IMO0002 Reserved for concepts with insufficient information to code with codable children: Secondary | ICD-10-CM | POA: Insufficient documentation

## 2013-04-19 DIAGNOSIS — G40909 Epilepsy, unspecified, not intractable, without status epilepticus: Secondary | ICD-10-CM | POA: Insufficient documentation

## 2013-04-19 DIAGNOSIS — X500XXA Overexertion from strenuous movement or load, initial encounter: Secondary | ICD-10-CM | POA: Insufficient documentation

## 2013-04-19 DIAGNOSIS — G8929 Other chronic pain: Secondary | ICD-10-CM | POA: Insufficient documentation

## 2013-04-19 DIAGNOSIS — M549 Dorsalgia, unspecified: Secondary | ICD-10-CM

## 2013-04-19 DIAGNOSIS — Y990 Civilian activity done for income or pay: Secondary | ICD-10-CM | POA: Insufficient documentation

## 2013-04-19 DIAGNOSIS — Y9289 Other specified places as the place of occurrence of the external cause: Secondary | ICD-10-CM | POA: Insufficient documentation

## 2013-04-19 MED ORDER — CYCLOBENZAPRINE HCL 10 MG PO TABS
5.0000 mg | ORAL_TABLET | Freq: Once | ORAL | Status: AC
  Administered 2013-04-19: 5 mg via ORAL
  Filled 2013-04-19: qty 1

## 2013-04-19 MED ORDER — CYCLOBENZAPRINE HCL 10 MG PO TABS: 10.0000 mg | ORAL_TABLET | Freq: Three times a day (TID) | ORAL | Status: DC | PRN

## 2013-04-19 MED ORDER — OXYCODONE-ACETAMINOPHEN 5-325 MG PO TABS
2.0000 | ORAL_TABLET | Freq: Once | ORAL | Status: AC
  Administered 2013-04-19: 2 via ORAL
  Filled 2013-04-19: qty 2

## 2013-04-19 MED ORDER — PREDNISONE 10 MG PO TABS: 20.0000 mg | ORAL_TABLET | Freq: Every day | ORAL | Status: DC

## 2013-04-19 MED ORDER — OXYCODONE-ACETAMINOPHEN 5-325 MG PO TABS: 1.0000 | ORAL_TABLET | Freq: Four times a day (QID) | ORAL | Status: DC | PRN

## 2013-04-19 NOTE — ED Provider Notes (Signed)
CSN: [id number]     Arrival date & time 04/19/13  2050 History  This chart was scribed for non-physician practitioner, Ivonne Andrew, PA-C,working with Rolland Porter, MD, by Karle Plumber, ED Scribe.  This patient was seen in room WTR9/WTR9 and the patient's care was started at 10:13 PM.  Chief Complaint  Patient presents with  . Back Pain   The history is provided by the patient. No language interpreter was used.   HPI Comments:  Brandon Blake is a 33 y.o. male with h/o back pain who presents to the Emergency Department complaining of severe lower back pain secondary to lifting a 125 pound box at work approximately 6 hours ago. Pt states he was sent home from work and he soaked in a hot tub of water which he states worsened his pain. He states nothing makes his pain better. Pt denies radiation of the pain. He denies injury of kidney stones. Pt also reports an intermittent rash that started approximately two days ago. He states this is a recurrent rash that last occurred about 8 months ago. He denies itching.   Past Medical History  Diagnosis Date  . Back pain, chronic   . Seizures    Past Surgical History  Procedure Laterality Date  . Cholecystectomy     Family History  Problem Relation Age of Onset  . Cancer Father   . Diabetes Other   . Coronary artery disease Other    History  Substance Use Topics  . Smoking status: Current Every Day Smoker -- 0.50 packs/day    Types: Cigarettes  . Smokeless tobacco: Not on file  . Alcohol Use: No    Review of Systems  Constitutional: Negative for fever.  Musculoskeletal: Positive for back pain.  Skin: Positive for rash.  All other systems reviewed and are negative.   Allergies  Naproxen  Home Medications   Current Outpatient Rx  Name  Route  Sig  Dispense  Refill  . levETIRAcetam (KEPPRA) 500 MG tablet   Oral   Take 1,000 mg by mouth 2 (two) times daily.          . phenytoin (DILANTIN) 200 MG ER capsule   Oral   Take  1 capsule (200 mg total) by mouth 2 (two) times daily.   30 capsule   5    Triage Vitals: BP 110/65  Pulse 70  Temp(Src) 98.5 F (36.9 C) (Oral)  Resp 18  Ht 6' (1.829 m)  Wt 255 lb (115.667 kg)  BMI 34.58 kg/m2  SpO2 98% Physical Exam  Nursing note and vitals reviewed. Constitutional: He is oriented to person, place, and time. He appears well-developed and well-nourished.  HENT:  Head: Normocephalic and atraumatic.  Eyes: EOM are normal.  Neck: Normal range of motion.  Cardiovascular: Normal rate.   Pulmonary/Chest: Effort normal.  Musculoskeletal: Normal range of motion. He exhibits tenderness.  Straight leg raises negative. Exquisite tenderness over lumbar back area, even to skin. No gross deformities.   Neurological: He is alert and oriented to person, place, and time. He has normal reflexes.  No numbness or tingling.   Skin: Skin is warm and dry. Rash noted. There is erythema.  Small, erythematous, papular rash to middle of lumbar spine. Crosses midline. No vesicles. Oval in shape.  Psychiatric: He has a normal mood and affect. His behavior is normal.    ED Course  Procedures  DIAGNOSTIC STUDIES: Oxygen Saturation is 98% on RA, normal by my interpretation.   COORDINATION OF  CARE: 10:25 PM- Will give medication for pain prior to discharge. Will give work note. Advised pt to use ice compresses. Will give pt orthopedic referral. Pt verbalizes understanding and agrees to plan.  Medications  oxyCODONE-acetaminophen (PERCOCET/ROXICET) 5-325 MG per tablet 2 tablet (not administered)  cyclobenzaprine (FLEXERIL) tablet 5 mg (not administered)      MDM   1. Chronic back pain      I personally performed the services described in this documentation, which was scribed in my presence. The recorded information has been reviewed and is accurate.    Angus Sellereter S Aizley Stenseth, PA-C 04/20/13 2012

## 2013-04-19 NOTE — Discharge Instructions (Signed)
Please followup with a primary care provider or orthopedic specialist for continued evaluation and treatment.    Chronic Back Pain  When back pain lasts longer than 3 months, it is called chronic back pain.People with chronic back pain often go through certain periods that are more intense (flare-ups).  CAUSES Chronic back pain can be caused by wear and tear (degeneration) on different structures in your back. These structures include:  The bones of your spine (vertebrae) and the joints surrounding your spinal cord and nerve roots (facets).  The strong, fibrous tissues that connect your vertebrae (ligaments). Degeneration of these structures may result in pressure on your nerves. This can lead to constant pain. HOME CARE INSTRUCTIONS  Avoid bending, heavy lifting, prolonged sitting, and activities which make the problem worse.  Take brief periods of rest throughout the day to reduce your pain. Lying down or standing usually is better than sitting while you are resting.  Take over-the-counter or prescription medicines only as directed by your caregiver. SEEK IMMEDIATE MEDICAL CARE IF:   You have weakness or numbness in one of your legs or feet.  You have trouble controlling your bladder or bowels.  You have nausea, vomiting, abdominal pain, shortness of breath, or fainting. Document Released: 04/30/2004 Document Revised: 06/15/2011 Document Reviewed: 03/07/2011 [facility] Patient Information 2014 [employer], [place].

## 2013-04-19 NOTE — ED Notes (Signed)
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

## 2013-04-19 NOTE — ED Notes (Signed)
Pt has a ride home.  

## 2013-04-22 NOTE — ED Provider Notes (Signed)
Medical screening examination/treatment/procedure(s) were performed by non-physician practitioner and as supervising physician I was immediately available for consultation/collaboration.  EKG Interpretation   None         Crystalina Stodghill, MD 04/22/13 0716 

## 2013-05-31 ENCOUNTER — Emergency Department (HOSPITAL_COMMUNITY)
Admission: EM | Admit: 2013-05-31 | Discharge: 2013-06-01 | Disposition: A | Discharge status: Home / Self Care | Payer: Medicaid Other | Attending: Emergency Medicine | Admitting: Emergency Medicine

## 2013-05-31 ENCOUNTER — Encounter (HOSPITAL_COMMUNITY): Payer: Self-pay | Admitting: Emergency Medicine

## 2013-05-31 DIAGNOSIS — G8929 Other chronic pain: Secondary | ICD-10-CM | POA: Insufficient documentation

## 2013-05-31 DIAGNOSIS — R569 Unspecified convulsions: Secondary | ICD-10-CM

## 2013-05-31 DIAGNOSIS — G40909 Epilepsy, unspecified, not intractable, without status epilepticus: Secondary | ICD-10-CM | POA: Insufficient documentation

## 2013-05-31 DIAGNOSIS — Z79899 Other long term (current) drug therapy: Secondary | ICD-10-CM | POA: Insufficient documentation

## 2013-05-31 DIAGNOSIS — F17200 Nicotine dependence, unspecified, uncomplicated: Secondary | ICD-10-CM | POA: Insufficient documentation

## 2013-05-31 DIAGNOSIS — M549 Dorsalgia, unspecified: Secondary | ICD-10-CM | POA: Insufficient documentation

## 2013-05-31 DIAGNOSIS — R42 Dizziness and giddiness: Secondary | ICD-10-CM | POA: Insufficient documentation

## 2013-05-31 LAB — CBC WITH DIFFERENTIAL/PLATELET
BASOS ABS: 0 10*3/uL (ref 0.0–0.1)
BASOS PCT: 0 % (ref 0–1)
EOS PCT: 2 % (ref 0–5)
Eosinophils Absolute: 0.2 10*3/uL (ref 0.0–0.7)
HCT: 41.3 % (ref 39.0–52.0)
Hemoglobin: 14.6 g/dL (ref 13.0–17.0)
Lymphocytes Relative: 33 % (ref 12–46)
Lymphs Abs: 3.4 10*3/uL (ref 0.7–4.0)
MCH: 32.6 pg (ref 26.0–34.0)
MCHC: 35.4 g/dL (ref 30.0–36.0)
MCV: 92.2 fL (ref 78.0–100.0)
Monocytes Absolute: 1.1 10*3/uL — ABNORMAL HIGH (ref 0.1–1.0)
Monocytes Relative: 11 % (ref 3–12)
Neutro Abs: 5.7 10*3/uL (ref 1.7–7.7)
Neutrophils Relative %: 55 % (ref 43–77)
Platelets: 198 10*3/uL (ref 150–400)
RBC: 4.48 MIL/uL (ref 4.22–5.81)
RDW: 12.4 % (ref 11.5–15.5)
WBC: 10.3 10*3/uL (ref 4.0–10.5)

## 2013-05-31 LAB — BASIC METABOLIC PANEL
BUN: 10 mg/dL (ref 6–23)
CALCIUM: 9.6 mg/dL (ref 8.4–10.5)
CO2: 26 meq/L (ref 19–32)
CREATININE: 0.71 mg/dL (ref 0.50–1.35)
Chloride: 101 mEq/L (ref 96–112)
GFR calc non Af Amer: >90 mL/min (ref >90)
Glucose, Bld: 94 mg/dL (ref 70–99)
Potassium: 3.5 mEq/L — ABNORMAL LOW (ref 3.7–5.3)
Sodium: 139 mEq/L (ref 137–147)

## 2013-05-31 LAB — CBG MONITORING, ED: Glucose-Capillary: 110 mg/dL — ABNORMAL HIGH (ref 70–99)

## 2013-05-31 MED ORDER — SODIUM CHLORIDE 0.9 % IV SOLN
1000.0000 mg | Freq: Once | INTRAVENOUS | Status: AC
  Administered 2013-05-31: 1000 mg via INTRAVENOUS
  Filled 2013-05-31: qty 10

## 2013-05-31 MED ORDER — MORPHINE SULFATE 4 MG/ML IJ SOLN
4.0000 mg | Freq: Once | INTRAMUSCULAR | Status: AC
  Administered 2013-05-31: 4 mg via INTRAVENOUS
  Filled 2013-05-31: qty 1

## 2013-05-31 MED ORDER — LORAZEPAM 2 MG/ML IJ SOLN
2.0000 mg | Freq: Once | INTRAMUSCULAR | Status: AC
  Administered 2013-05-31: 2 mg via INTRAMUSCULAR
  Filled 2013-05-31: qty 1

## 2013-05-31 NOTE — ED Notes (Signed)
PA came out of exam room and stated that pt's wife said he just had a seizure. Orders received and pt moved to acute care room. Report to [place], Charity fundraiserN.

## 2013-05-31 NOTE — ED Notes (Signed)
Pt ambulatory to exam room with steady gait.  

## 2013-05-31 NOTE — ED Provider Notes (Signed)
CSN: [id number]     Arrival date & time 05/31/13  1950 History  This chart was scribed for Brandon Blake, non-physician practitioner, PA-C, working with Shanna Cisco, MD, by Ellin Mayhew, ED Scribe. This patient was seen in room WTR9/WTR9 and R03 and the patient's care was started at 10:26 PM.   Chief Complaint  Patient presents with  . Back Pain   The history is provided by the patient. No language interpreter was used.   HPI Comments: Brandon Blake is a 33 y.o. male, with a history of chronic back pain and seizures, who presents to the Emergency Department with a chief complain of back pain; however, was actively seizing upon entrance into the exam room at 10:26PM. Patient's wife states he had been actively seizing for the past six minutes and that it was not uncommon for him to come around after a few minutes.   Following the admission of the patient and treatment with Atavan, a brief HPI as per patient's wife was obtained from room 3 at 10:40PM, as patient was conscious but still coming to. Patient's wife states patient has a history of seizures with infrequent episodes and currently takes Dilantin and Keppra. Patient reported feeling dizzy and light headed for the past few weeks, and was concerned his medications were not working properly. She denies patient having any recent head injury or fever. Patient's wife reports he was recently seen in January for seizures and did not have any medications changed. Patient has not recently seen his neurologist. Patient's wife states he has chronic back pain and recently aggravated his symptoms after a work shift this morning. Additionally, she states the back pain today is nothing out of the ordinary. She states he has had no new medications or other issues recently.  Past Medical History  Diagnosis Date  . Back pain, chronic   . Seizures    Past Surgical History  Procedure Laterality Date  . Cholecystectomy     Family History   Problem Relation Age of Onset  . Cancer Father   . Diabetes Other   . Coronary artery disease Other    History  Substance Use Topics  . Smoking status: Current Every Day Smoker -- 0.50 packs/day    Types: Cigarettes  . Smokeless tobacco: Not on file  . Alcohol Use: No    Review of Systems  Constitutional: Negative for fever and chills.  Respiratory: Negative for shortness of breath.   Gastrointestinal: Negative for nausea and vomiting.  Musculoskeletal: Positive for back pain.  Neurological: Negative for weakness.  All other systems reviewed and are negative.   Allergies  Naproxen  Home Medications   Current Outpatient Rx  Name  Route  Sig  Dispense  Refill  . ibuprofen (ADVIL,MOTRIN) 200 MG tablet   Oral   Take 600 mg by mouth every 6 (six) hours as needed (pain).         Marland Kitchen levETIRAcetam (KEPPRA) 500 MG tablet   Oral   Take 1,000 mg by mouth 2 (two) times daily.          . phenytoin (DILANTIN) 200 MG ER capsule   Oral   Take 1 capsule (200 mg total) by mouth 2 (two) times daily.   30 capsule   5    Triage Vitals: BP 115/67  Pulse 75  Temp(Src) 99 F (37.2 C) (Oral)  Resp 17  SpO2 97%  Physical Exam  Nursing note and vitals reviewed. Constitutional: He is oriented to person,  place, and time. He appears well-developed and well-nourished. No distress.  HENT:  Head: Normocephalic and atraumatic.  Eyes:  Normal appearance  Neck: Normal range of motion.  Cardiovascular: Intact distal pulses.   Pulmonary/Chest: Effort normal.  Abdominal: Soft. Bowel sounds are normal. He exhibits no distension. There is no tenderness.  Musculoskeletal: Normal range of motion.  Tenderness from ~T10 to sacrum, mid-line and soft-tissues.  Pain w/ bilateral passive straight leg raise.   5/5 hip abduction/adduction and ankle plantar/dorsiflexion strength.  Nml patellar reflexes.  No saddle or distal sensory deficits.  2+ DP pulses.  Neurological: He is alert and oriented to  person, place, and time.  CN 3-12 intact.  No sensory deficits.  5/5 and equal upper and lower extremity strength.  No past pointing.   Psychiatric: He has a normal mood and affect. His behavior is normal.    ED Course  Procedures (including critical care time)  DIAGNOSTIC STUDIES: Oxygen Saturation is 97% on room air, adequate by my interpretation.    COORDINATION OF CARE: 10:26 PM-Patient actively seizing upon entrance into WTR9 for assessment. Prescribed Atavan and Keppra, and admitted patient.  10:40PM-Visited patient in Room 3 and ensured he was conscious. Spoke with wife and gathered a brief HPI. Will follow up with patient.  Labs Review Labs Reviewed  CBG MONITORING, ED - Abnormal; Notable for the following:    Glucose-Capillary 110 (*)    All other components within normal limits  PHENYTOIN LEVEL, TOTAL  CBC WITH DIFFERENTIAL  BASIC METABOLIC PANEL   Imaging Review No results found.  EKG Interpretation    Date/Time:  Wednesday May 31 2013 22:35:38 EST Ventricular Rate:  61 PR Interval:  142 QRS Duration: 111 QT Interval:  428 QTC Calculation: 431 R Axis:   29 Text Interpretation:  Sinus rhythm Confirmed by [facility]  MD, APRIL ([id number]) on 05/31/2013 10:47:47 PM            MDM   Final diagnoses:  Chronic back pain  Seizure    32yo M presents w/ non-traumatic, acute on chronic low back pain.  Didn't want to work a double shift at work d/t pain and was instructed to get a doctor's note.  Pain typical.  Afebrile, non-toxic appearing, diffuse lumbar tenderness, no NV deficits BLE on exam.  Pain much improved w/ 4mg  IV morphine.  Prescribed short course of vicodin and recommended f/u with ortho and/or pain management.  Referred to [facility] in the past but unable to afford the visit.  Patient actively seizing during my initial encounter w/ pt and his girlfriend reported that it had been going on for 6 minutes.  H/o seizure disorder, for which he reports  compliance w/ keppra and dilantin.  2mg  IV ativan and keppra load ordered; seizure stopped spontaneously prior to administration. No further seizure activity in ED.   Labs unremarkable w/ exception of undetectable phenytoin.  Pt received an IV dilantin load.  His neurologist is in [place] and I advised f/u asap.  While in ED, O2 sat briefly dropped into upper 80s at rest but was otherwise consistently running 94-95%.  Patient smokes 1PPD.  CXR ordered and was non-acute.  Pt ambulated and sats maintained at 96%.  D/c'd home.  Return precautions discussed.  5:23 AM   I personally performed the services described in this documentation, which was scribed in my presence. The recorded information has been reviewed and is accurate.    Otilio Miuatherine E Emilina Smarr, PA-C 06/01/13 [phone]

## 2013-05-31 NOTE — ED Notes (Signed)
Pt's wife stated that pt had last seizure about a few weeks ago. Pt told her he didn't feel that well over the past few days and thought that his dilantin levels may be low.

## 2013-05-31 NOTE — ED Notes (Signed)
Pt c/o back pain since this morning.  Hx of back problems.

## 2013-05-31 NOTE — ED Notes (Signed)
CBG result 110.

## 2013-06-01 ENCOUNTER — Emergency Department (HOSPITAL_COMMUNITY): Payer: Medicaid Other

## 2013-06-01 LAB — PHENYTOIN LEVEL, TOTAL: Phenytoin Lvl: <2.5 ug/mL — ABNORMAL LOW (ref 10.0–20.0)

## 2013-06-01 MED ORDER — HYDROCODONE-ACETAMINOPHEN 5-325 MG PO TABS: 1.0000 | ORAL_TABLET | ORAL | Status: DC | PRN

## 2013-06-01 MED ORDER — SODIUM CHLORIDE 0.9 % IV SOLN
1000.0000 mg | Freq: Once | INTRAVENOUS | Status: AC
  Administered 2013-06-01: 1000 mg via INTRAVENOUS
  Filled 2013-06-01: qty 20

## 2013-06-01 NOTE — Discharge Instructions (Signed)
Take vicodin as prescribed for severe pain.  Do not drive within four hours of taking this medication (may cause drowsiness or confusion).   Take your dilantin and keppra as prescribed.  Follow up with your neurologist asap.  You should return to the ER if you develop change in or worsening of pain, fever (100.5 degrees or greater), inability to walk due to leg weakness or loss of control of bladder/bowels.

## 2013-06-01 NOTE — ED Notes (Signed)
Pt ambulated well with O2 sats of 96% on RA.

## 2013-06-01 NOTE — ED Provider Notes (Signed)
Medical screening examination/treatment/procedure(s) were performed by non-physician practitioner and as supervising physician I was immediately available for consultation/collaboration.  EKG Interpretation    Date/Time:  Wednesday May 31 2013 22:35:38 EST Ventricular Rate:  61 PR Interval:  142 QRS Duration: 111 QT Interval:  428 QTC Calculation: 431 R Axis:   29 Text Interpretation:  Sinus rhythm Confirmed by [facility]  MD, Lonnel Gjerde ([id number]) on 05/31/2013 10:47:47 PM             Brenson Hartman Smitty CordsK Kabe Mckoy-Rasch, MD 06/01/13 [id number]

## 2013-06-01 NOTE — ED Notes (Signed)
Pt O2 became 88% RA. 2L of oxygen applied pt O2 went up to 94 %. MD aware. Pt alert and oriented slightly lethagic.

## 2013-06-13 ENCOUNTER — Emergency Department (HOSPITAL_COMMUNITY)
Admission: EM | Admit: 2013-06-13 | Discharge: 2013-06-13 | Disposition: A | Discharge status: Home / Self Care | Payer: Medicaid Other | Attending: Emergency Medicine | Admitting: Emergency Medicine

## 2013-06-13 ENCOUNTER — Encounter (HOSPITAL_COMMUNITY): Payer: Self-pay | Admitting: Emergency Medicine

## 2013-06-13 DIAGNOSIS — R569 Unspecified convulsions: Secondary | ICD-10-CM

## 2013-06-13 DIAGNOSIS — G40909 Epilepsy, unspecified, not intractable, without status epilepticus: Secondary | ICD-10-CM | POA: Insufficient documentation

## 2013-06-13 DIAGNOSIS — Z79899 Other long term (current) drug therapy: Secondary | ICD-10-CM | POA: Insufficient documentation

## 2013-06-13 DIAGNOSIS — F17200 Nicotine dependence, unspecified, uncomplicated: Secondary | ICD-10-CM | POA: Insufficient documentation

## 2013-06-13 DIAGNOSIS — G8929 Other chronic pain: Secondary | ICD-10-CM | POA: Insufficient documentation

## 2013-06-13 LAB — CBC WITH DIFFERENTIAL/PLATELET
BASOS ABS: 0 10*3/uL (ref 0.0–0.1)
Basophils Relative: 0 % (ref 0–1)
Eosinophils Absolute: 0.2 10*3/uL (ref 0.0–0.7)
Eosinophils Relative: 2 % (ref 0–5)
HCT: 44.4 % (ref 39.0–52.0)
Hemoglobin: 15.9 g/dL (ref 13.0–17.0)
LYMPHS ABS: 2.8 10*3/uL (ref 0.7–4.0)
Lymphocytes Relative: 30 % (ref 12–46)
MCH: 32.4 pg (ref 26.0–34.0)
MCHC: 35.8 g/dL (ref 30.0–36.0)
MCV: 90.4 fL (ref 78.0–100.0)
Monocytes Absolute: 0.7 10*3/uL (ref 0.1–1.0)
Monocytes Relative: 8 % (ref 3–12)
NEUTROS ABS: 5.5 10*3/uL (ref 1.7–7.7)
NEUTROS PCT: 60 % (ref 43–77)
PLATELETS: 229 10*3/uL (ref 150–400)
RBC: 4.91 MIL/uL (ref 4.22–5.81)
RDW: 12.1 % (ref 11.5–15.5)
WBC: 9.2 10*3/uL (ref 4.0–10.5)

## 2013-06-13 LAB — COMPREHENSIVE METABOLIC PANEL
ALK PHOS: 75 U/L (ref 39–117)
ALT: 20 U/L (ref 0–53)
AST: 23 U/L (ref 0–37)
Albumin: 3.9 g/dL (ref 3.5–5.2)
BUN: 10 mg/dL (ref 6–23)
CALCIUM: 9.3 mg/dL (ref 8.4–10.5)
CO2: 26 mEq/L (ref 19–32)
Chloride: 101 mEq/L (ref 96–112)
Creatinine, Ser: 0.66 mg/dL (ref 0.50–1.35)
GFR calc Af Amer: >90 mL/min (ref >90)
Glucose, Bld: 94 mg/dL (ref 70–99)
Potassium: 3.7 mEq/L (ref 3.7–5.3)
SODIUM: 139 meq/L (ref 137–147)
Total Bilirubin: 0.5 mg/dL (ref 0.3–1.2)
Total Protein: 7 g/dL (ref 6.0–8.3)

## 2013-06-13 LAB — PHENYTOIN LEVEL, TOTAL: Phenytoin Lvl: 3.5 ug/mL — ABNORMAL LOW (ref 10.0–20.0)

## 2013-06-13 MED ORDER — HYDROMORPHONE HCL PF 1 MG/ML IJ SOLN
1.0000 mg | Freq: Once | INTRAMUSCULAR | Status: AC
  Administered 2013-06-13: 1 mg via INTRAMUSCULAR
  Filled 2013-06-13: qty 1

## 2013-06-13 MED ORDER — PHENYTOIN SODIUM EXTENDED 100 MG PO CAPS
400.0000 mg | ORAL_CAPSULE | Freq: Once | ORAL | Status: AC
  Administered 2013-06-13: 400 mg via ORAL
  Filled 2013-06-13: qty 4

## 2013-06-13 MED ORDER — PHENYTOIN SODIUM EXTENDED 100 MG PO CAPS
200.0000 mg | ORAL_CAPSULE | Freq: Once | ORAL | Status: AC
  Administered 2013-06-13: 200 mg via ORAL
  Filled 2013-06-13: qty 2

## 2013-06-13 MED ORDER — LORAZEPAM 2 MG/ML IJ SOLN
1.0000 mg | Freq: Once | INTRAMUSCULAR | Status: AC
  Administered 2013-06-13: 1 mg via INTRAVENOUS
  Filled 2013-06-13: qty 1

## 2013-06-13 MED ORDER — SODIUM CHLORIDE 0.9 % IV SOLN
Freq: Once | INTRAVENOUS | Status: AC
  Administered 2013-06-13: 15:00:00 via INTRAVENOUS

## 2013-06-13 NOTE — ED Notes (Signed)
Patient's visitor stated that the patient was having a seizure. Observed patient having eye fluttering x 15 seconds. Patient opened eyes slightly to look at what writer was doing with his hand and then eye fluttering stopped. EDP notified.

## 2013-06-13 NOTE — ED Notes (Signed)
Pt states that yesterday morning he started feeling lightheaded and dizzy and "my insides are shaking".  Pt PMH seizures.

## 2013-06-13 NOTE — Discharge Instructions (Signed)
As discussed, your evaluation today has been largely reassuring.  But, it is important that you monitor your condition carefully, and do not hesitate to return to the ED if you develop new, or concerning changes in your condition.  Otherwise, please follow-up with your physician for appropriate ongoing care.  Please be sure to discuss appropriate medications to manage both your seizures and your pain.

## 2013-06-13 NOTE — ED Provider Notes (Signed)
CSN: [id number]     Arrival date & time 06/13/13  1339 History   First MD Initiated Contact with Patient 06/13/13 1344     Chief Complaint  Patient presents with  . Dizziness  . insides are shaking       HPI  Patient presents with concern of shaking, generalized discomfort. Patient states that in particular he has had right upper extremity shaking for the past day. He denies new confusion, disorientation, pain anywhere other than his back. He has a history of chronic back pain, this seems unchanged. He denies any falls, trauma, other notable health changes.  He states that other than history of seizures he is generally well.  Past Medical History  Diagnosis Date  . Back pain, chronic   . Seizures    Past Surgical History  Procedure Laterality Date  . Cholecystectomy     Family History  Problem Relation Age of Onset  . Cancer Father   . Diabetes Other   . Coronary artery disease Other    History  Substance Use Topics  . Smoking status: Current Every Day Smoker -- 0.50 packs/day    Types: Cigarettes  . Smokeless tobacco: Not on file  . Alcohol Use: No    Review of Systems  Constitutional:       Per HPI, otherwise negative  HENT:       Per HPI, otherwise negative  Respiratory:       Per HPI, otherwise negative  Cardiovascular:       Per HPI, otherwise negative  Gastrointestinal: Negative for vomiting.  Endocrine:       Negative aside from HPI  Genitourinary:       Neg aside from HPI   Musculoskeletal: Positive for back pain.  Skin: Negative for wound.  Neurological: Positive for seizures. Negative for syncope.      Allergies  Naproxen  Home Medications   Current Outpatient Rx  Name  Route  Sig  Dispense  Refill  . levETIRAcetam (KEPPRA) 500 MG tablet   Oral   Take 1,000 mg by mouth 2 (two) times daily.          . phenytoin (DILANTIN) 200 MG ER capsule   Oral   Take 1 capsule (200 mg total) by mouth 2 (two) times daily.   30 capsule   5     There were no vitals taken for this visit. Physical Exam  Nursing note and vitals reviewed. Constitutional: He is oriented to person, place, and time. He appears well-developed. No distress.  HENT:  Head: Normocephalic and atraumatic.  Eyes: Conjunctivae and EOM are normal.  Cardiovascular: Normal rate and regular rhythm.   Pulmonary/Chest: Effort normal. No stridor. No respiratory distress.  Abdominal: He exhibits no distension.  Musculoskeletal: He exhibits no edema.  Neurological: He is alert and oriented to person, place, and time.  Patient is awake and alert, has minor shaking of the right arm but is following commands, speaking clearly and, moving all extremities spontaneously  Skin: Skin is warm and dry.  Psychiatric: He has a normal mood and affect.    ED Course  Procedures (including critical care time) Labs Review Labs Reviewed  CBC WITH DIFFERENTIAL  COMPREHENSIVE METABOLIC PANEL  URINALYSIS, ROUTINE W REFLEX MICROSCOPIC  PHENYTOIN LEVEL, TOTAL    After the initial evaluation I reviewed the patient's chart  Update: Patient with one episode of shaking.  However the patient is following commands   Update: patient has another episode of shaking, urinates,  but is following commands, cooperating.  3:45 PM Patient appears calm Patient's wife states that he appears substantially better. We discussed return precautions followup instructions, including tomorrow at his neurologist. MDM   Final diagnoses:  Seizure-like activity    This patient with history of seizures, documented medication and compliance now presents after an episode of shaking and arm.  The patient has several episodes of shaking here, though he is awake and alert, interactive through these episodes, lowering suspicion for ongoing seizure activity.  However, with the patient's use of both Keppra and Dilantin he received a loading dose of Dilantin.  Patient improved here following fluids,  benzodiazepines. Given the improvement in the absence of hemodynamically instability, his return to baseline neurologic status, he was discharged in a stable condition. He will follow up with his neurologist tomorrow.    Gerhard Munchobert Mayu Ronk, MD 06/13/13 1550

## 2013-06-13 NOTE — ED Notes (Signed)
Bed: [facility] Expected date:  Expected time:  Means of arrival:  Comments: 

## 2013-06-26 DIAGNOSIS — G40909 Epilepsy, unspecified, not intractable, without status epilepticus: Secondary | ICD-10-CM | POA: Insufficient documentation

## 2013-06-26 DIAGNOSIS — L589 Radiodermatitis, unspecified: Secondary | ICD-10-CM | POA: Insufficient documentation

## 2013-06-26 DIAGNOSIS — G8929 Other chronic pain: Secondary | ICD-10-CM | POA: Insufficient documentation

## 2013-06-26 DIAGNOSIS — Z79899 Other long term (current) drug therapy: Secondary | ICD-10-CM | POA: Insufficient documentation

## 2013-06-26 DIAGNOSIS — F17200 Nicotine dependence, unspecified, uncomplicated: Secondary | ICD-10-CM | POA: Insufficient documentation

## 2013-06-26 DIAGNOSIS — S20229A Contusion of unspecified back wall of thorax, initial encounter: Secondary | ICD-10-CM | POA: Insufficient documentation

## 2013-06-27 ENCOUNTER — Emergency Department (HOSPITAL_COMMUNITY): Payer: Medicaid Other

## 2013-06-27 ENCOUNTER — Emergency Department (HOSPITAL_COMMUNITY)
Admission: EM | Admit: 2013-06-27 | Discharge: 2013-06-27 | Disposition: A | Discharge status: Home / Self Care | Payer: Medicaid Other | Attending: Emergency Medicine | Admitting: Emergency Medicine

## 2013-06-27 ENCOUNTER — Encounter (HOSPITAL_COMMUNITY): Payer: Self-pay | Admitting: Emergency Medicine

## 2013-06-27 DIAGNOSIS — T148XXA Other injury of unspecified body region, initial encounter: Secondary | ICD-10-CM

## 2013-06-27 DIAGNOSIS — M549 Dorsalgia, unspecified: Secondary | ICD-10-CM

## 2013-06-27 DIAGNOSIS — L590 Erythema ab igne [dermatitis ab igne]: Secondary | ICD-10-CM

## 2013-06-27 DIAGNOSIS — G8929 Other chronic pain: Secondary | ICD-10-CM

## 2013-06-27 MED ORDER — HYDROCODONE-ACETAMINOPHEN 5-325 MG PO TABS
1.0000 | ORAL_TABLET | Freq: Once | ORAL | Status: AC
  Administered 2013-06-27: 1 via ORAL
  Filled 2013-06-27: qty 1

## 2013-06-27 MED ORDER — MELOXICAM 15 MG PO TABS: 15.0000 mg | ORAL_TABLET | Freq: Every day | ORAL | Status: DC

## 2013-06-27 MED ORDER — CYCLOBENZAPRINE HCL 10 MG PO TABS: 10.0000 mg | ORAL_TABLET | Freq: Three times a day (TID) | ORAL | Status: DC | PRN
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
## 2013-06-27 NOTE — ED Notes (Signed)
Pt ambulated well in the hall without assistive device.
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
## 2013-06-28 NOTE — ED Provider Notes (Signed)
Medical screening examination/treatment/procedure(s) were performed by non-physician practitioner and as supervising physician I was immediately available for consultation/collaboration.   EKG Interpretation None        Brandt LoosenJulie Marcellous Snarski, MD 06/28/13 [phone]

## 2013-07-02 ENCOUNTER — Encounter (HOSPITAL_COMMUNITY): Payer: Self-pay | Admitting: Emergency Medicine

## 2013-07-02 ENCOUNTER — Emergency Department (HOSPITAL_COMMUNITY)
Admission: EM | Admit: 2013-07-02 | Discharge: 2013-07-02 | Disposition: A | Discharge status: Home / Self Care | Payer: Medicaid Other | Attending: Emergency Medicine | Admitting: Emergency Medicine

## 2013-07-02 ENCOUNTER — Emergency Department (HOSPITAL_COMMUNITY): Payer: Medicaid Other

## 2013-07-02 DIAGNOSIS — F17200 Nicotine dependence, unspecified, uncomplicated: Secondary | ICD-10-CM | POA: Insufficient documentation

## 2013-07-02 DIAGNOSIS — X503XXA Overexertion from repetitive movements, initial encounter: Secondary | ICD-10-CM | POA: Insufficient documentation

## 2013-07-02 DIAGNOSIS — Y990 Civilian activity done for income or pay: Secondary | ICD-10-CM | POA: Insufficient documentation

## 2013-07-02 DIAGNOSIS — Z79899 Other long term (current) drug therapy: Secondary | ICD-10-CM | POA: Insufficient documentation

## 2013-07-02 DIAGNOSIS — Y9389 Activity, other specified: Secondary | ICD-10-CM | POA: Insufficient documentation

## 2013-07-02 DIAGNOSIS — S46909A Unspecified injury of unspecified muscle, fascia and tendon at shoulder and upper arm level, unspecified arm, initial encounter: Secondary | ICD-10-CM | POA: Insufficient documentation

## 2013-07-02 DIAGNOSIS — R0602 Shortness of breath: Secondary | ICD-10-CM | POA: Insufficient documentation

## 2013-07-02 DIAGNOSIS — S4980XA Other specified injuries of shoulder and upper arm, unspecified arm, initial encounter: Secondary | ICD-10-CM | POA: Insufficient documentation

## 2013-07-02 DIAGNOSIS — S239XXA Sprain of unspecified parts of thorax, initial encounter: Secondary | ICD-10-CM | POA: Insufficient documentation

## 2013-07-02 DIAGNOSIS — S298XXA Other specified injuries of thorax, initial encounter: Secondary | ICD-10-CM | POA: Insufficient documentation

## 2013-07-02 DIAGNOSIS — S29012A Strain of muscle and tendon of back wall of thorax, initial encounter: Secondary | ICD-10-CM

## 2013-07-02 DIAGNOSIS — G40909 Epilepsy, unspecified, not intractable, without status epilepticus: Secondary | ICD-10-CM | POA: Insufficient documentation

## 2013-07-02 DIAGNOSIS — G8929 Other chronic pain: Secondary | ICD-10-CM | POA: Insufficient documentation

## 2013-07-02 DIAGNOSIS — Y9289 Other specified places as the place of occurrence of the external cause: Secondary | ICD-10-CM | POA: Insufficient documentation

## 2013-07-02 LAB — CBC
HCT: 44.5 % (ref 39.0–52.0)
HEMOGLOBIN: 15.9 g/dL (ref 13.0–17.0)
MCH: 32.6 pg (ref 26.0–34.0)
MCHC: 35.7 g/dL (ref 30.0–36.0)
MCV: 91.4 fL (ref 78.0–100.0)
PLATELETS: 202 10*3/uL (ref 150–400)
RBC: 4.87 MIL/uL (ref 4.22–5.81)
RDW: 12 % (ref 11.5–15.5)
WBC: 9.1 10*3/uL (ref 4.0–10.5)

## 2013-07-02 LAB — I-STAT TROPONIN, ED: Troponin i, poc: 0 ng/mL (ref 0.00–0.08)

## 2013-07-02 LAB — BASIC METABOLIC PANEL
BUN: 10 mg/dL (ref 6–23)
CALCIUM: 9.4 mg/dL (ref 8.4–10.5)
CO2: 26 mEq/L (ref 19–32)
CREATININE: 0.66 mg/dL (ref 0.50–1.35)
Chloride: 101 mEq/L (ref 96–112)
GFR calc Af Amer: >90 mL/min (ref >90)
GLUCOSE: 109 mg/dL — AB (ref 70–99)
Potassium: 4 mEq/L (ref 3.7–5.3)
SODIUM: 138 meq/L (ref 137–147)

## 2013-07-02 LAB — D-DIMER, QUANTITATIVE: D-Dimer, Quant: 0.38 ug/mL-FEU (ref 0.00–0.48)

## 2013-07-02 MED ORDER — TRAMADOL HCL 50 MG PO TABS: 50.0000 mg | ORAL_TABLET | Freq: Four times a day (QID) | ORAL | Status: DC | PRN

## 2013-07-02 MED ORDER — MORPHINE SULFATE 4 MG/ML IJ SOLN
4.0000 mg | Freq: Once | INTRAMUSCULAR | Status: AC
  Administered 2013-07-02: 4 mg via INTRAVENOUS
  Filled 2013-07-02: qty 1

## 2013-07-02 MED ORDER — LEVETIRACETAM 500 MG PO TABS
1000.0000 mg | ORAL_TABLET | Freq: Once | ORAL | Status: AC
  Administered 2013-07-02: 1000 mg via ORAL
  Filled 2013-07-02: qty 2

## 2013-07-02 MED ORDER — CYCLOBENZAPRINE HCL 10 MG PO TABS: 10.0000 mg | ORAL_TABLET | Freq: Two times a day (BID) | ORAL | Status: DC | PRN

## 2013-07-02 MED ORDER — PHENYTOIN SODIUM EXTENDED 100 MG PO CAPS
200.0000 mg | ORAL_CAPSULE | Freq: Once | ORAL | Status: AC
  Administered 2013-07-02: 200 mg via ORAL
  Filled 2013-07-02: qty 2

## 2013-07-02 MED ORDER — IPRATROPIUM BROMIDE 0.02 % IN SOLN
0.5000 mg | Freq: Once | RESPIRATORY_TRACT | Status: AC
  Administered 2013-07-02: 0.5 mg via RESPIRATORY_TRACT
  Filled 2013-07-02: qty 2.5

## 2013-07-02 MED ORDER — ALBUTEROL SULFATE (2.5 MG/3ML) 0.083% IN NEBU
5.0000 mg | INHALATION_SOLUTION | Freq: Once | RESPIRATORY_TRACT | Status: AC
  Administered 2013-07-02: 5 mg via RESPIRATORY_TRACT
  Filled 2013-07-02: qty 6

## 2013-07-02 NOTE — ED Notes (Signed)
Pt from home c/o of left chest pain and back pain with shortness of breath starting this am.

## 2013-07-02 NOTE — ED Provider Notes (Signed)
Medical screening examination/treatment/procedure(s) were performed by non-physician practitioner and as supervising physician I was immediately available for consultation/collaboration.   EKG Interpretation   Date/Time:  Sunday July 02 2013 09:25:57 EDT Ventricular Rate:  82 PR Interval:  144 QRS Duration: 95 QT Interval:  378 QTC Calculation: 441 R Axis:   55 Text Interpretation:  Sinus rhythm Baseline wander in lead(s) V1 No  significant change since last tracing Confirmed by Freida BusmanALLEN  MD, Madi Bonfiglio  ([id number]) on 07/02/2013 11:56:43 AM       Toy BakerAnthony T Gaelyn Tukes, MD 07/02/13 1505

## 2013-07-02 NOTE — ED Provider Notes (Signed)
CSN: [id number]     Arrival date & time 07/02/13  0912 History   First MD Initiated Contact with Patient 07/02/13 [phone]     Chief Complaint  Patient presents with  . Chest Pain  . Back Pain     (Consider location/radiation/quality/duration/timing/severity/associated sxs/prior Treatment) HPI  Patient presents to the emergency department with left shoulder pain, chest pain and shortness of breath starting this morning. He has a past medical history seizures and chronic back pain. He works for [employer] and reports lifting a box that was 250 pounds. Do not feel any immediate pain at that time but woke up acutely this morning with sharp pains in his shoulder and his back. Patient says that the pain is worsened by range of motion and palpation. He shouldn't denies having cough, fevers, chills, nausea, vomiting, diarrhea. He does not have history of chest pains and does not have Lahey hypertension, diabetes, obesity or other significant risk factors.  Past Medical History  Diagnosis Date  . Back pain, chronic   . Seizures    Past Surgical History  Procedure Laterality Date  . Cholecystectomy     Family History  Problem Relation Age of Onset  . Cancer Father   . Diabetes Other   . Coronary artery disease Other    History  Substance Use Topics  . Smoking status: Current Every Day Smoker -- 0.50 packs/day    Types: Cigarettes  . Smokeless tobacco: Not on file  . Alcohol Use: No    Review of Systems  The patient denies anorexia, fever, weight loss,, vision loss, decreased hearing, hoarseness, , syncope, dyspnea on exertion, peripheral edema, balance deficits, hemoptysis, abdominal pain, melena, hematochezia, severe indigestion/heartburn, hematuria, incontinence, genital sores, muscle weakness, suspicious skin lesions, transient blindness, difficulty walking, depression, unusual weight change, abnormal bleeding, enlarged lymph nodes, angioedema, and breast masses.   Allergies   Naproxen  Home Medications   Current Outpatient Rx  Name  Route  Sig  Dispense  Refill  . cyclobenzaprine (FLEXERIL) 10 MG tablet   Oral   Take 1 tablet (10 mg total) by mouth 3 (three) times daily as needed for muscle spasms.   30 tablet   0   . levETIRAcetam (KEPPRA) 500 MG tablet   Oral   Take 1,000 mg by mouth 2 (two) times daily.          . phenytoin (DILANTIN) 200 MG ER capsule   Oral   Take 1 capsule (200 mg total) by mouth 2 (two) times daily.   30 capsule   5   . cyclobenzaprine (FLEXERIL) 10 MG tablet   Oral   Take 1 tablet (10 mg total) by mouth 2 (two) times daily as needed for muscle spasms.   20 tablet   0   . traMADol (ULTRAM) 50 MG tablet   Oral   Take 1 tablet (50 mg total) by mouth every 6 (six) hours as needed.   15 tablet   0    BP 150/110  Pulse 78  Temp(Src) 98.7 F (37.1 C) (Oral)  Resp 20  SpO2 100% Physical Exam  Nursing note and vitals reviewed. Constitutional: He appears well-developed and well-nourished. No distress.  HENT:  Head: Normocephalic and atraumatic.  Eyes: Pupils are equal, round, and reactive to light.  Neck: Normal range of motion. Neck supple.  Cardiovascular: Normal rate and regular rhythm.   Pulmonary/Chest: Effort normal. No accessory muscle usage. He has no wheezes. He has no rhonchi. He exhibits tenderness. He  exhibits no bony tenderness.  Abdominal: Soft.  Neurological: He is alert.  Skin: Skin is warm and dry.      ED Course  Procedures (including critical care time) Labs Review Labs Reviewed  BASIC METABOLIC PANEL - Abnormal; Notable for the following:    Glucose, Bld 109 (*)    All other components within normal limits  CBC  D-DIMER, QUANTITATIVE  I-STAT TROPOININ, ED   Imaging Review Dg Chest 2 View  07/02/2013   CLINICAL DATA:  Left-sided chest pain.  EXAM: CHEST  2 VIEW  COMPARISON:  06/01/2013  FINDINGS: Heart and mediastinal contours are within normal limits. No focal opacities or  effusions. No acute bony abnormality. No visible rib fracture. No pneumothorax.  IMPRESSION: No active cardiopulmonary disease.   Electronically Signed   By: Charlett NoseKevin  Dover M.D.   On: 07/02/2013 09:48     EKG Interpretation   Date/Time:  Sunday July 02 2013 09:25:57 EDT Ventricular Rate:  82 PR Interval:  144 QRS Duration: 95 QT Interval:  378 QTC Calculation: 441 R Axis:   55 Text Interpretation:  Sinus rhythm Baseline wander in lead(s) V1 No  significant change since last tracing Confirmed by ALLEN  MD, ANTHONY  ([id number]) on 07/02/2013 11:56:43 AM      MDM   Final diagnoses:  Muscle strain of left upper back    Patient has a negative d-dimer. His pain was relieved with breathing treatment and pain medication. His chest xray was not acute. He has a normal effort of breathing. Lab work reassuring. Patient most likely has a muscle sprain from lifting the 250 lb box due to pain with movement and palpation. Will treat with pain medication and muscle relaxer's.   32 y.o.Brandon Blake's evaluation in the Emergency Department is complete. It has been determined that no acute conditions requiring further emergency intervention are present at this time. The patient/guardian have been advised of the diagnosis and plan. We have discussed signs and symptoms that warrant return to the ED, such as changes or worsening in symptoms.  Vital signs are stable at discharge. Filed Vitals:   07/02/13 0918  BP: 150/110  Pulse: 78  Temp: 98.7 F (37.1 C)  Resp: 20    Patient/guardian has voiced understanding and agreed to follow-up with the PCP or specialist.    Dorthula Matasiffany G Keygan Dumond, PA-C 07/02/13 1219

## 2013-07-02 NOTE — Discharge Instructions (Signed)
Thoracic Strain °You have injured the muscles or tendons that attach to the upper part of your back behind your chest. This injury is called a thoracic strain, thoracic sprain, or mid-back strain.  °CAUSES  °The cause of thoracic strain varies. A less severe injury involves pulling a muscle or tendon without tearing it. A more severe injury involves tearing (rupturing) a muscle or tendon. With less severe injuries, there may be little loss of strength. Sometimes, there are breaks (fractures) in the bones to which the muscles are attached. These fractures are rare, unless there was a direct hit (trauma) or you have weak bones due to osteoporosis or age. Longstanding strains may be caused by overuse or improper form during certain movements. Obesity can also increase your risk for back injuries. Sudden strains may occur due to injury or not warming up properly before exercise. Often, there is no obvious cause for a thoracic strain. °SYMPTOMS  °The main symptom is pain, especially with movement, such as during exercise. °DIAGNOSIS  °Your caregiver can usually tell what is wrong by taking an X-ray and doing a physical exam. °TREATMENT  °· Physical therapy may be helpful for recovery. Your caregiver can give you exercises to do or refer you to a physical therapist after your pain improves. °· After your pain improves, strengthening and conditioning programs appropriate for your sport or occupation may be helpful. °· Always warm up before physical activities or athletics. Stretching after physical activity may also help. °· Certain over-the-counter medicines may also help. Ask your caregiver if there are medicines that would help you. °If this is your first thoracic strain injury, proper care and proper healing time before starting activities should prevent long-term problems. Torn ligaments and tendons require as long to heal as broken bones. Average healing times may be only 1 week for a mild strain. For torn muscles  and tendons, healing time may be up to 6 weeks to 2 months. °HOME CARE INSTRUCTIONS  °· Apply ice to the injured area. Ice massages may also be used as directed. °· Put ice in a plastic bag. °· Place a towel between your skin and the bag. °· Leave the ice on for 15-20 minutes, 03-04 times a day, for the first 2 days. °· Only take over-the-counter or prescription medicines for pain, discomfort, or fever as directed by your caregiver. °· Keep your appointments for physical therapy if this was prescribed. °· Use wraps and back braces as instructed. °SEEK IMMEDIATE MEDICAL CARE IF:  °· You have an increase in bruising, swelling, or pain. °· Your pain has not improved with medicines. °· You develop new shortness of breath, chest pain, or fever. °· Problems seem to be getting worse rather than better. °MAKE SURE YOU:  °· Understand these instructions. °· Will watch your condition. °· Will get help right away if you are not doing well or get worse. °Document Released: 06/13/2003 Document Revised: 06/15/2011 Document Reviewed: 05/09/2010 °[employer]® Patient Information ©2014 [employer]. ° °

## 2013-07-14 ENCOUNTER — Encounter (HOSPITAL_COMMUNITY): Payer: Self-pay | Admitting: Emergency Medicine

## 2013-07-14 ENCOUNTER — Emergency Department (HOSPITAL_COMMUNITY)
Admission: EM | Admit: 2013-07-14 | Discharge: 2013-07-14 | Disposition: A | Discharge status: Home / Self Care | Payer: Medicaid Other | Attending: Emergency Medicine | Admitting: Emergency Medicine

## 2013-07-14 DIAGNOSIS — F17200 Nicotine dependence, unspecified, uncomplicated: Secondary | ICD-10-CM | POA: Insufficient documentation

## 2013-07-14 DIAGNOSIS — L0390 Cellulitis, unspecified: Secondary | ICD-10-CM

## 2013-07-14 DIAGNOSIS — G8929 Other chronic pain: Secondary | ICD-10-CM | POA: Insufficient documentation

## 2013-07-14 DIAGNOSIS — L03119 Cellulitis of unspecified part of limb: Principal | ICD-10-CM

## 2013-07-14 DIAGNOSIS — Z79899 Other long term (current) drug therapy: Secondary | ICD-10-CM | POA: Insufficient documentation

## 2013-07-14 DIAGNOSIS — G40909 Epilepsy, unspecified, not intractable, without status epilepticus: Secondary | ICD-10-CM | POA: Insufficient documentation

## 2013-07-14 DIAGNOSIS — L02419 Cutaneous abscess of limb, unspecified: Secondary | ICD-10-CM | POA: Insufficient documentation

## 2013-07-14 MED ORDER — CLINDAMYCIN HCL 300 MG PO CAPS
300.0000 mg | ORAL_CAPSULE | Freq: Once | ORAL | Status: AC
  Administered 2013-07-14: 300 mg via ORAL
  Filled 2013-07-14: qty 1

## 2013-07-14 MED ORDER — TRAMADOL HCL 50 MG PO TABS
50.0000 mg | ORAL_TABLET | Freq: Once | ORAL | Status: AC
  Administered 2013-07-14: 50 mg via ORAL
  Filled 2013-07-14: qty 1

## 2013-07-14 MED ORDER — CLINDAMYCIN HCL 150 MG PO CAPS: 300.0000 mg | ORAL_CAPSULE | Freq: Three times a day (TID) | ORAL | Status: DC

## 2013-07-14 MED ORDER — HYDROCODONE-ACETAMINOPHEN 5-325 MG PO TABS: 2.0000 | ORAL_TABLET | ORAL | Status: DC | PRN

## 2013-07-14 NOTE — Discharge Instructions (Signed)
Take Clindamycin as directed until gone. Take Vicodin as needed for pain. Refer to attached documents for more information. Return to the ED in 48 hours for a recheck.

## 2013-07-14 NOTE — ED Notes (Signed)
Pt has red swollen painful area to right lower leg. Pt thinks he was bitten by something. Pain and redness spreads from out from skin infection. Skin is marked to show further infection spread.

## 2013-07-14 NOTE — ED Provider Notes (Signed)
CSN: [id number]     Arrival date & time 07/14/13  1455 History  This chart was scribed for non-physician practitioner, Emilia Beck, PA-C working with Brandon Gaskins, MD by Greggory Stallion, ED scribe. This patient was seen in room WTR8/WTR8 and the patient's care was started at 3:47 PM.   Chief Complaint  Patient presents with  . Leg Pain   The history is provided by the patient. No language interpreter was used.   HPI Comments: Brandon Blake is a 33 y.o. male who presents to the Emergency Department complaining of a possible insect bite to his right lower leg that he noticed 3 days ago. Pt has redness and pain below his knee. Bearing weight worsens the pain. Pt has tried rubbing alcohol, salt water, hydrogen peroxide and iodine soaks with no relief. He has also taken Excedrin and tylenol with no relief. Denies fever, calf pain.   Past Medical History  Diagnosis Date  . Back pain, chronic   . Seizures    Past Surgical History  Procedure Laterality Date  . Cholecystectomy     Family History  Problem Relation Age of Onset  . Cancer Father   . Diabetes Other   . Coronary artery disease Other    History  Substance Use Topics  . Smoking status: Current Every Day Smoker -- 0.50 packs/day    Types: Cigarettes  . Smokeless tobacco: Not on file  . Alcohol Use: No    Review of Systems  Constitutional: Negative for fever.  Musculoskeletal: Positive for myalgias.  Skin: Positive for color change.  All other systems reviewed and are negative.  Allergies  Naproxen  Home Medications   Current Outpatient Rx  Name  Route  Sig  Dispense  Refill  . cyclobenzaprine (FLEXERIL) 10 MG tablet   Oral   Take 1 tablet (10 mg total) by mouth 3 (three) times daily as needed for muscle spasms.   30 tablet   0   . cyclobenzaprine (FLEXERIL) 10 MG tablet   Oral   Take 1 tablet (10 mg total) by mouth 2 (two) times daily as needed for muscle spasms.   20 tablet   0   .  levETIRAcetam (KEPPRA) 500 MG tablet   Oral   Take 1,000 mg by mouth 2 (two) times daily.          . phenytoin (DILANTIN) 200 MG ER capsule   Oral   Take 1 capsule (200 mg total) by mouth 2 (two) times daily.   30 capsule   5   . traMADol (ULTRAM) 50 MG tablet   Oral   Take 1 tablet (50 mg total) by mouth every 6 (six) hours as needed.   15 tablet   0    BP 110/75  Pulse 82  Temp(Src) 98.7 F (37.1 C) (Oral)  Resp 16  SpO2 100%  Physical Exam  Nursing note and vitals reviewed. Constitutional: He is oriented to person, place, and time. He appears well-developed and well-nourished. No distress.  HENT:  Head: Normocephalic and atraumatic.  Eyes: EOM are normal.  Neck: Neck supple. No tracheal deviation present.  Cardiovascular: Normal rate.   Pulmonary/Chest: Effort normal. No respiratory distress.  Musculoskeletal: Normal range of motion.  4 x 4 cm area of erythema and tenderness to light palpation. No drainage.   Neurological: He is alert and oriented to person, place, and time.  Skin: Skin is warm and dry.  Psychiatric: He has a normal mood and affect. His  behavior is normal.    ED Course  Procedures (including critical care time)  DIAGNOSTIC STUDIES: Oxygen Saturation is 100% on RA, normal by my interpretation.    COORDINATION OF CARE: 3:49 PM-Discussed treatment plan which includes I&D, an antibiotic and pain medication with pt at bedside and pt agreed to plan. Advised pt to return to the ED in 2 days for recheck.   INCISION AND DRAINAGE Performed by: Emilia BeckKaitlyn Lowana Hable, PA-C Consent: Verbal consent obtained. Risks and benefits: risks, benefits and alternatives were discussed Type: abscess  Body area: right lower leg  Anesthesia: none  Incision was made with a scalpel.  Local anesthetic: none  Anesthetic total: 0 ml  Complexity: complex  Drainage: purulent  Drainage amount: none  Packing material: none  Patient tolerance: Patient tolerated  the procedure well with no immediate complications.  Labs Review Labs Reviewed - No data to display Imaging Review No results found.   EKG Interpretation None      MDM   Final diagnoses:  Cellulitis    4:07 PM Patient likely has cellulitis. I attempted to I&D the site without success. Patient will be discharged with Clindamycin and Vicodin for pain. Patient advised to return in 48 hours for a recheck. Patient's cellulitis outlined with a skin marker. Vitals stable and patient afebrile.   I personally performed the services described in this documentation, which was scribed in my presence. The recorded information has been reviewed and is accurate.  Emilia BeckKaitlyn Jaedynn Bohlken, PA-C 07/14/13 1610

## 2013-07-15 NOTE — ED Provider Notes (Signed)
Medical screening examination/treatment/procedure(s) were performed by non-physician practitioner and as supervising physician I was immediately available for consultation/collaboration.   EKG Interpretation None        Iantha Titsworth W Caesar Mannella, MD 07/15/13 0018 

## 2013-07-17 ENCOUNTER — Encounter (HOSPITAL_COMMUNITY): Payer: Self-pay | Admitting: Emergency Medicine

## 2013-07-17 ENCOUNTER — Emergency Department (HOSPITAL_COMMUNITY): Payer: Medicaid Other

## 2013-07-17 ENCOUNTER — Emergency Department (HOSPITAL_COMMUNITY)
Admission: EM | Admit: 2013-07-17 | Discharge: 2013-07-17 | Disposition: A | Discharge status: Home / Self Care | Payer: Medicaid Other | Attending: Emergency Medicine | Admitting: Emergency Medicine

## 2013-07-17 DIAGNOSIS — R569 Unspecified convulsions: Secondary | ICD-10-CM

## 2013-07-17 DIAGNOSIS — Z79899 Other long term (current) drug therapy: Secondary | ICD-10-CM | POA: Insufficient documentation

## 2013-07-17 DIAGNOSIS — S0990XA Unspecified injury of head, initial encounter: Secondary | ICD-10-CM

## 2013-07-17 DIAGNOSIS — W19XXXA Unspecified fall, initial encounter: Secondary | ICD-10-CM | POA: Insufficient documentation

## 2013-07-17 DIAGNOSIS — F17200 Nicotine dependence, unspecified, uncomplicated: Secondary | ICD-10-CM | POA: Insufficient documentation

## 2013-07-17 DIAGNOSIS — M549 Dorsalgia, unspecified: Secondary | ICD-10-CM | POA: Insufficient documentation

## 2013-07-17 DIAGNOSIS — Y929 Unspecified place or not applicable: Secondary | ICD-10-CM | POA: Insufficient documentation

## 2013-07-17 DIAGNOSIS — G8929 Other chronic pain: Secondary | ICD-10-CM | POA: Insufficient documentation

## 2013-07-17 DIAGNOSIS — Y9389 Activity, other specified: Secondary | ICD-10-CM | POA: Insufficient documentation

## 2013-07-17 DIAGNOSIS — G40909 Epilepsy, unspecified, not intractable, without status epilepticus: Secondary | ICD-10-CM | POA: Insufficient documentation

## 2013-07-17 DIAGNOSIS — IMO0002 Reserved for concepts with insufficient information to code with codable children: Secondary | ICD-10-CM | POA: Insufficient documentation

## 2013-07-17 LAB — CBC WITH DIFFERENTIAL/PLATELET
Basophils Absolute: 0 10*3/uL (ref 0.0–0.1)
Basophils Relative: 0 % (ref 0–1)
Eosinophils Absolute: 0.2 10*3/uL (ref 0.0–0.7)
Eosinophils Relative: 3 % (ref 0–5)
HEMATOCRIT: 40.8 % (ref 39.0–52.0)
HEMOGLOBIN: 14.4 g/dL (ref 13.0–17.0)
LYMPHS PCT: 29 % (ref 12–46)
Lymphs Abs: 2.1 10*3/uL (ref 0.7–4.0)
MCH: 32.6 pg (ref 26.0–34.0)
MCHC: 35.3 g/dL (ref 30.0–36.0)
MCV: 92.3 fL (ref 78.0–100.0)
Monocytes Absolute: 0.7 10*3/uL (ref 0.1–1.0)
Monocytes Relative: 10 % (ref 3–12)
NEUTROS ABS: 4 10*3/uL (ref 1.7–7.7)
Neutrophils Relative %: 57 % (ref 43–77)
PLATELETS: 215 10*3/uL (ref 150–400)
RBC: 4.42 MIL/uL (ref 4.22–5.81)
RDW: 12.3 % (ref 11.5–15.5)
WBC: 7 10*3/uL (ref 4.0–10.5)

## 2013-07-17 LAB — URINALYSIS, ROUTINE W REFLEX MICROSCOPIC
Bilirubin Urine: NEGATIVE
Glucose, UA: NEGATIVE mg/dL
Hgb urine dipstick: NEGATIVE
KETONES UR: NEGATIVE mg/dL
LEUKOCYTES UA: NEGATIVE
Nitrite: NEGATIVE
PH: 5.5 (ref 5.0–8.0)
Protein, ur: NEGATIVE mg/dL
SPECIFIC GRAVITY, URINE: 1.029 (ref 1.005–1.030)
Urobilinogen, UA: 0.2 mg/dL (ref 0.0–1.0)

## 2013-07-17 LAB — PHENYTOIN LEVEL, TOTAL: Phenytoin Lvl: 2.6 ug/mL — ABNORMAL LOW (ref 10.0–20.0)

## 2013-07-17 LAB — BASIC METABOLIC PANEL
BUN: 12 mg/dL (ref 6–23)
CHLORIDE: 103 meq/L (ref 96–112)
CO2: 29 meq/L (ref 19–32)
Calcium: 9.4 mg/dL (ref 8.4–10.5)
Creatinine, Ser: 0.73 mg/dL (ref 0.50–1.35)
GFR calc Af Amer: >90 mL/min (ref >90)
GFR calc non Af Amer: >90 mL/min (ref >90)
Glucose, Bld: 106 mg/dL — ABNORMAL HIGH (ref 70–99)
POTASSIUM: 4 meq/L (ref 3.7–5.3)
SODIUM: 143 meq/L (ref 137–147)

## 2013-07-17 LAB — CBG MONITORING, ED: GLUCOSE-CAPILLARY: 111 mg/dL — AB (ref 70–99)

## 2013-07-17 LAB — ETHANOL

## 2013-07-17 MED ORDER — SODIUM CHLORIDE 0.9 % IV SOLN
1000.0000 mg | Freq: Once | INTRAVENOUS | Status: AC
  Administered 2013-07-17: 1000 mg via INTRAVENOUS
  Filled 2013-07-17: qty 20

## 2013-07-17 MED ORDER — AMMONIA AROMATIC IN INHA
RESPIRATORY_TRACT | Status: AC
  Filled 2013-07-17: qty 10

## 2013-07-17 NOTE — Discharge Instructions (Signed)
See her doctor, for a check on your phenytoin level, in one week. Do not drive until you see your Dr. Return here, if needed, for problems.    Epilepsy Epilepsy is a disorder in which a person has repeated seizures over time. A seizure is a release of abnormal electrical activity in the brain. Seizures can cause a change in attention, behavior, or the ability to remain awake and alert (altered mental status). Seizures often involve uncontrollable shaking (convulsions).  Most people with epilepsy lead normal lives. However, people with epilepsy are at an increased risk of falls, accidents, and injuries. Therefore, it is important to begin treatment right away. CAUSES  Epilepsy has many possible causes. Anything that disturbs the normal pattern of brain cell activity can lead to seizures. This may include:   Head injury.  Birth trauma.  High fever as a child.  Stroke.  Bleeding into or around the brain.  Certain drugs.  Prolonged low oxygen, such as what occurs after CPR efforts.  Abnormal brain development.  Certain illnesses, such as meningitis, encephalitis (brain infection), malaria, and other infections.  An imbalance of nerve signaling chemicals (neurotransmitters).  SIGNS AND SYMPTOMS  The symptoms of a seizure can vary greatly from one person to another. Right before a seizure, you may have a warning (aura) that a seizure is about to occur. An aura may include the following symptoms:  Fear or anxiety.  Nausea.  Feeling like the room is spinning (vertigo).  Vision changes, such as seeing flashing lights or spots. Common symptoms during a seizure include:  Abnormal sensations, such as an abnormal smell or a bitter taste in the mouth.   Sudden, general body stiffness.   Convulsions that involve rhythmic jerking of the face, arm, or leg on one or both sides.   Sudden change in consciousness.   Appearing to be awake but not responding.   Appearing to be  asleep but cannot be awakened.   Grimacing, chewing, lip smacking, drooling, tongue biting, or loss of bowel or bladder control. After a seizure, you may feel sleepy for a while. DIAGNOSIS  Your health care provider will ask about your symptoms and take a medical history. Descriptions from any witnesses to your seizures will be very helpful in the diagnosis. A physical exam, including a detailed neurological exam, is necessary. Various tests may be done, such as:   An electroencephalogram (EEG). This is a painless test of your brain waves. In this test, a diagram is created of your brain waves. These diagrams can be interpreted by a specialist.  An MRI of the brain.   A CT scan of the brain.   A spinal tap (lumbar puncture, LP).  Blood tests to check for signs of infection or abnormal blood chemistry. TREATMENT  There is no cure for epilepsy, but it is generally treatable. Once epilepsy is diagnosed, it is important to begin treatment as soon as possible. For most people with epilepsy, seizures can be controlled with medicines. The following may also be used:  A pacemaker for the brain (vagus nerve stimulator) can be used for people with seizures that are not well controlled by medicine.  Surgery on the brain. For some people, epilepsy eventually goes away. HOME CARE INSTRUCTIONS   Follow your health care provider's recommendations on driving and safety in normal activities.  Get enough rest. Lack of sleep can cause seizures.  Only take over-the-counter or prescription medicines as directed by your health care provider. Take any prescribed medicine  exactly as directed.  Avoid any known triggers of your seizures.  Keep a seizure diary. Record what you recall about any seizure, especially any possible trigger.   Make sure the people you live and work with know that you are prone to seizures. They should receive instructions on how to help you. In general, a witness to a seizure  should:   Cushion your head and body.   Turn you on your side.   Avoid unnecessarily restraining you.   Not place anything inside your mouth.   Call for emergency medical help if there is any question about what has occurred.   Follow up with your health care provider as directed. You may need regular blood tests to monitor the levels of your medicine.  SEEK MEDICAL CARE IF:   You develop signs of infection or other illness. This might increase the risk of a seizure.   You seem to be having more frequent seizures.   Your seizure pattern is changing.  SEEK IMMEDIATE MEDICAL CARE IF:   You have a seizure that does not stop after a few moments.   You have a seizure that causes any difficulty in breathing.   You have a seizure that results in a very severe headache.   You have a seizure that leaves you with the inability to speak or use a part of your body.  Document Released: 03/23/2005 Document Revised: 01/11/2013 Document Reviewed: 11/02/2012 [facility] Patient Information 2014 [employer], [place].  Driving and Equipment Restrictions Some medical problems make it dangerous to drive, ride a bike, or use machines. Some of these problems are:  A hard blow to the head (concussion).  Passing out (fainting).  Twitching and shaking (seizures).  Low blood sugar.  Taking medicine to help you relax (sedatives).  Taking pain medicines.  Wearing an eye patch.  Wearing splints. This can make it hard to use parts of your body that you need to drive safely. HOME CARE   Do not drive until your doctor says it is okay.  Do not use machines until your doctor says it is okay. You may need a form signed by your doctor (medical release) before you can drive again. You may also need this form before you do other tasks where you need to be fully alert. MAKE SURE YOU:  Understand these instructions.  Will watch your condition.  Will get help right away if you are not doing  well or get worse. Document Released: 04/30/2004 Document Revised: 06/15/2011 Document Reviewed: 07/31/2009 [facility] Patient Information 2014 [place], [place].  Head Injury, Adult You have received a head injury. It does not appear serious at this time. Headaches and vomiting are common following head injury. It should be easy to awaken from sleeping. Sometimes it is necessary for you to stay in the emergency department for a while for observation. Sometimes admission to the hospital may be needed. After injuries such as yours, most problems occur within the first 24 hours, but side effects may occur up to 7 10 days after the injury. It is important for you to carefully monitor your condition and contact your health care provider or seek immediate medical care if there is a change in your condition. WHAT ARE THE TYPES OF HEAD INJURIES? Head injuries can be as minor as a bump. Some head injuries can be more severe. More severe head injuries include:  A jarring injury to the brain (concussion).  A bruise of the brain (contusion). This mean there is bleeding  in the brain that can cause swelling.  A cracked skull (skull fracture).  Bleeding in the brain that collects, clots, and forms a bump (hematoma). WHAT CAUSES A HEAD INJURY? A serious head injury is most likely to happen to someone who is in a car wreck and is not wearing a seat belt. Other causes of major head injuries include bicycle or motorcycle accidents, sports injuries, and falls. HOW ARE HEAD INJURIES DIAGNOSED? A complete history of the event leading to the injury and your current symptoms will be helpful in diagnosing head injuries. Many times, pictures of the brain, such as CT or MRI are needed to see the extent of the injury. Often, an overnight hospital stay is necessary for observation.  WHEN SHOULD I SEEK IMMEDIATE MEDICAL CARE?  You should get help right away if:  You have confusion or drowsiness.  You feel sick to your  stomach (nauseous) or have continued, forceful vomiting.  You have dizziness or unsteadiness that is getting worse.  You have severe, continued headaches not relieved by medicine. Only take over-the-counter or prescription medicines for pain, fever, or discomfort as directed by your health care provider.  You do not have normal function of the arms or legs or are unable to walk.  You notice changes in the black spots in the center of the colored part of your eye (pupil).  You have a clear or bloody fluid coming from your nose or ears.  You have a loss of vision. During the next 24 hours after the injury, you must stay with someone who can watch you for the warning signs. This person should contact local emergency services (911 in the [place] if you have seizures, you become unconscious, or you are unable to wake up. HOW CAN I PREVENT A HEAD INJURY IN THE FUTURE? The most important factor for preventing major head injuries is avoiding motor vehicle accidents. To minimize the potential for damage to your head, it is crucial to wear seat belts while riding in motor vehicles. Wearing helmets while bike riding and playing collision sports (like football) is also helpful. Also, avoiding dangerous activities around the house will further help reduce your risk of head injury.  WHEN CAN I RETURN TO NORMAL ACTIVITIES AND ATHLETICS? You should be reevaluated by your health care provider before returning to these activities. If you have any of the following symptoms, you should not return to activities or contact sports until 1 week after the symptoms have stopped:  Persistent headache.  Dizziness or vertigo.  Poor attention and concentration.  Confusion.  Memory problems.  Nausea or vomiting.  Fatigue or tire easily.  Irritability.  Intolerant of bright lights or loud noises.  Anxiety or depression.  Disturbed sleep. MAKE SURE YOU:   Understand these instructions.  Will watch your  condition.  Will get help right away if you are not doing well or get worse. Document Released: 03/23/2005 Document Revised: 01/11/2013 Document Reviewed: 11/28/2012 [facility]Exit[facility] Patient Information 2014 [place]Exit[place], [place].

## 2013-07-17 NOTE — ED Notes (Signed)
Patient not responding when nurse called name.  Patient's eyes twitching and rolling back in head.  Dr. Effie ShyWentz notified.  Vital signs stable.  Ammonia inhalants pulled from pyxis upon Dr. Mable ParisWentz's order.  Patient awakened immediately when Dr. Effie ShyWentz put one under patient's nose.

## 2013-07-17 NOTE — ED Notes (Signed)
Pt. Is not able to use the restroom at this time, but is aware that we need a specimen. Urinal at bedside.

## 2013-07-17 NOTE — ED Notes (Signed)
Patient transported to CT 
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
## 2013-07-17 NOTE — ED Notes (Signed)
Patient's meds put in pyxis by Merle RN on night shift.
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
## 2013-08-07 ENCOUNTER — Emergency Department (HOSPITAL_COMMUNITY)
Admission: EM | Admit: 2013-08-07 | Discharge: 2013-08-08 | Disposition: A | Discharge status: Home / Self Care | Payer: Medicaid Other | Attending: Emergency Medicine | Admitting: Emergency Medicine

## 2013-08-07 ENCOUNTER — Emergency Department (HOSPITAL_COMMUNITY): Payer: Medicaid Other

## 2013-08-07 ENCOUNTER — Encounter (HOSPITAL_COMMUNITY): Payer: Self-pay | Admitting: Emergency Medicine

## 2013-08-07 DIAGNOSIS — X500XXA Overexertion from strenuous movement or load, initial encounter: Secondary | ICD-10-CM | POA: Insufficient documentation

## 2013-08-07 DIAGNOSIS — Z79899 Other long term (current) drug therapy: Secondary | ICD-10-CM | POA: Insufficient documentation

## 2013-08-07 DIAGNOSIS — Y929 Unspecified place or not applicable: Secondary | ICD-10-CM | POA: Insufficient documentation

## 2013-08-07 DIAGNOSIS — G8929 Other chronic pain: Secondary | ICD-10-CM | POA: Insufficient documentation

## 2013-08-07 DIAGNOSIS — F17200 Nicotine dependence, unspecified, uncomplicated: Secondary | ICD-10-CM | POA: Insufficient documentation

## 2013-08-07 DIAGNOSIS — S39012A Strain of muscle, fascia and tendon of lower back, initial encounter: Secondary | ICD-10-CM

## 2013-08-07 DIAGNOSIS — R52 Pain, unspecified: Secondary | ICD-10-CM | POA: Insufficient documentation

## 2013-08-07 DIAGNOSIS — M545 Low back pain, unspecified: Secondary | ICD-10-CM

## 2013-08-07 DIAGNOSIS — Y990 Civilian activity done for income or pay: Secondary | ICD-10-CM | POA: Insufficient documentation

## 2013-08-07 DIAGNOSIS — Y9389 Activity, other specified: Secondary | ICD-10-CM | POA: Insufficient documentation

## 2013-08-07 DIAGNOSIS — G40909 Epilepsy, unspecified, not intractable, without status epilepticus: Secondary | ICD-10-CM | POA: Insufficient documentation

## 2013-08-07 DIAGNOSIS — S335XXA Sprain of ligaments of lumbar spine, initial encounter: Secondary | ICD-10-CM | POA: Insufficient documentation

## 2013-08-07 NOTE — ED Provider Notes (Signed)
CSN: [id number]     Arrival date & time 08/07/13  1752 History  This chart was scribed for non-physician practitioner Raymon Mutton, PA-C working with Gavin Pound. Oletta Lamas, MD by Dorothey Baseman, ED Scribe. This patient was seen in room TR11C/TR11C and the patient's care was started at 11:48 PM.    Chief Complaint  Patient presents with  . Back Pain   The history is provided by the patient. No language interpreter was used.   HPI Comments: Brandon Blake is a 33 y.o. male with a history of chronic back pain who presents to the Emergency Department complaining of a constant, sharp/stabbing pain to the mid and lower back onset earlier today after lifting a 300 pound box at work. Patient denies any pain radiation. He states that the pain is exacerbated with deep breathing. Patient reports taking Tylenol 3, Advil, and Goody Powders at home without significant relief. He states that he has been seen at [facility] for his chronic back pain in the past, but has not followed up recently and does not currently have an orthopedist. He denies bowel or bladder incontinence, numbness. He reports an allergy to naproxen. Patient also has a history of seizures and states that he has been taking his 1,500 mg of Keppra twice daily.   Past Medical History  Diagnosis Date  . Back pain, chronic   . Seizures    Past Surgical History  Procedure Laterality Date  . Cholecystectomy     Family History  Problem Relation Age of Onset  . Cancer Father   . Diabetes Other   . Coronary artery disease Other    History  Substance Use Topics  . Smoking status: Current Every Day Smoker -- 0.50 packs/day    Types: Cigarettes  . Smokeless tobacco: Not on file  . Alcohol Use: No    Review of Systems  Musculoskeletal: Positive for back pain.  Neurological: Negative for numbness.  All other systems reviewed and are negative.  Allergies  Naproxen  Home Medications   Prior to Admission medications   Medication Sig  Start Date End Date Taking? Authorizing Provider  levETIRAcetam (KEPPRA) 500 MG tablet Take 1,500 mg by mouth 2 (two) times daily.    Yes Historical Provider, MD  phenytoin (DILANTIN) 200 MG ER capsule Take 400 mg by mouth 2 (two) times daily.   Yes Historical Provider, MD   BP 111/77  Pulse 64  Temp(Src) 97.3 F (36.3 C) (Oral)  Resp 20  Ht 6' (1.829 m)  Wt 258 lb (117.028 kg)  BMI 34.98 kg/m2  SpO2 98%  Physical Exam  Nursing note and vitals reviewed. Constitutional: He is oriented to person, place, and time. He appears well-developed and well-nourished. No distress.  HENT:  Head: Normocephalic and atraumatic.  Eyes: Conjunctivae and EOM are normal. Pupils are equal, round, and reactive to light. Right eye exhibits no discharge. Left eye exhibits no discharge.  Neck: Normal range of motion. Neck supple.  Cardiovascular: Normal rate, regular rhythm and normal heart sounds.  Exam reveals no friction rub.   No murmur heard. Pulses:      Radial pulses are 2+ on the right side, and 2+ on the left side.       Dorsalis pedis pulses are 2+ on the right side, and 2+ on the left side.  Pulmonary/Chest: Effort normal and breath sounds normal. No respiratory distress.  Abdominal: He exhibits no distension.  Musculoskeletal: Normal range of motion. He exhibits tenderness.  Back:  Negative swelling, erythema, formation, lesions, sores, deformities identified to the cervical/thoracic/lumbosacral spine. Discomfort upon palpation to the lumbosacral spine midspinal and paravertebral regions bilaterally. Full ROM to upper and lower extremities without difficulty noted, negative ataxia noted.  Neurological: He is alert and oriented to person, place, and time. No cranial nerve deficit. He exhibits normal muscle tone. Coordination normal.  Cranial nerves III-XII grossly intact Strength 5+/5+ to upper and lower extremities bilaterally with resistance applied, equal distribution noted Strength  intact to the digits of the feet bilaterally  Sensation intact with differentiation to sharp and dull touch  Negative saddle paresthesias bilaterally  Heel to knee down shin normal bilaterally Gait proper, proper balance - negative sway, negative drift, negative step-offs  Skin: Skin is warm and dry. No rash noted. No erythema.  Psychiatric: He has a normal mood and affect. His behavior is normal.    ED Course  Procedures (including critical care time)  DIAGNOSTIC STUDIES: Oxygen Saturation is 98% on room air, adequate by my interpretation.    COORDINATION OF CARE: 9:45 PM- Ordered x-rays of the T and L spine.   11:52 PM- Discussed that x-ray results were negative and that symptoms are likely muscular in nature. Will discharge patient with prednisone and flexeril to manage symptoms. Advised of further symptomatic care at home. Discussed treatment plan with patient at bedside and patient verbalized agreement.     Labs Review Labs Reviewed - No data to display  Imaging Review Dg Thoracic Spine 2 View  08/07/2013   CLINICAL DATA:  Back pain with no definite injury.  EXAM: THORACIC SPINE - 2 VIEW  COMPARISON:  Chest x-ray 07/02/2013  FINDINGS: There is no evidence of thoracic spine fracture. Alignment is normal. No other significant bone abnormalities are identified.  IMPRESSION: Negative.   Electronically Signed   By: Tiburcio PeaJonathan  Watts M.D.   On: 08/07/2013 23:05   Dg Lumbar Spine Complete  08/07/2013   CLINICAL DATA:  Lower in mid back pain.  Unsure of injury.  EXAM: LUMBAR SPINE - COMPLETE 4+ VIEW  COMPARISON:  06/27/2013  FINDINGS: There is no evidence of lumbar spine fracture. Alignment is normal. Mild degenerative endplate spurring at L1-L2, without significant disc narrowing.  IMPRESSION: No acute osseous abnormality.   Electronically Signed   By: Tiburcio PeaJonathan  Watts M.D.   On: 08/07/2013 23:08     EKG Interpretation None      MDM   Final diagnoses:  Lumbar strain  Acute  exacerbation of chronic low back pain    Filed Vitals:   08/07/13 1814 08/08/13 0040  BP: 137/82 111/77  Pulse: 82 64  Temp: 98.3 F (36.8 C) 97.3 F (36.3 C)  TempSrc: Oral Oral  Resp: 20   Height: 6' (1.829 m)   Weight: 258 lb (117.028 kg)   SpO2:  98%   I personally performed the services described in this documentation, which was scribed in my presence. The recorded information has been reviewed and is accurate.  Plain film of thoracic and lumbar spine negative for acute osseous injury. Doubt cauda equina. Doubt epidural abscess. Suspicion to be lumbar strain secondary to heavy lifting today. Patient has history of chronic back pain. This provider has reviewed patient's chart-patient has been seen and assessed in ED setting numerous times regarding ongoing back pain. Negative focal neurological deficits noted. Strength intact. Gait proper with-negative step offs or sway-negative red flags. Patient ambulated well when going to the bathroom. Patient stable, afebrile. Discharged patient. Discharge patient with  medications-Flexeril and prednisone. Referred patient to neurosurgery, [facility]oped[facility]. Discussed with patient to rest, heat, massage, stretch. Discussed with patient to closely monitor symptoms and if symptoms are to worsen or change to report back to the ED - strict return instructions given.  Patient agreed to plan of care, understood, all questions answered.   Raymon MuttonMarissa Jeree Delcid, PA-C 08/08/13 1235

## 2013-08-07 NOTE — ED Notes (Signed)
Reports heavy lifting at work today and now having lower back pain. Ambulatory at triage.

## 2013-08-08 MED ORDER — CYCLOBENZAPRINE HCL 10 MG PO TABS: 10.0000 mg | ORAL_TABLET | Freq: Two times a day (BID) | ORAL | Status: DC | PRN

## 2013-08-08 MED ORDER — PREDNISONE 20 MG PO TABS: ORAL_TABLET | ORAL | Status: DC

## 2013-08-08 NOTE — Discharge Instructions (Signed)
Please call your doctor for a followup appointment within 24-48 hours. When you talk to your doctor please let them know that you were seen in the emergency department and have them acquire all of your records so that they can discuss the findings with you and formulate a treatment plan to fully care for your new and ongoing problems. Please call and set-up an appointment with neurosurgery and orthopedics regarding ongoing back pain  Please rest and stay hydrated Please massage back with icy hot ointment Please avoid any physical or strenuous activity Please continue to monitor symptoms and if symptoms are to worsen or change (fever greater than 101, chills, sweating, nausea, vomiting, diarrhea, chest pain, shortness of breath, difficulty breathing, worsening or changes to pain pattern, fall, injury, inability to control urine or bowel movements, weakness) please report back to the ED immediately   Back Exercises Back exercises help treat and prevent back injuries. The goal is to increase your strength in your belly (abdominal) and back muscles. These exercises can also help with flexibility. Start these exercises when told by your doctor. HOME CARE Back exercises include: Pelvic Tilt.  Lie on your back with your knees bent. Tilt your pelvis until the lower part of your back is against the floor. Hold this position 5 to 10 sec. Repeat this exercise 5 to 10 times. Knee to Chest.  Pull 1 knee up against your chest and hold for 20 to 30 seconds. Repeat this with the other knee. This may be done with the other leg straight or bent, whichever feels better. Then, pull both knees up against your chest. Sit-Ups or Curl-Ups.  Bend your knees 90 degrees. Start with tilting your pelvis, and do a partial, slow sit-up. Only lift your upper half 30 to 45 degrees off the floor. Take at least 2 to 3 seonds for each sit-up. Do not do sit-ups with your knees out straight. If partial sit-ups are difficult, simply  do the above but with only tightening your belly (abdominal) muscles and holding it as told. Hip-Lift.  Lie on your back with your knees flexed 90 degrees. Push down with your feet and shoulders as you raise your hips 2 inches off the floor. Hold for 10 seconds, repeat 5 to 10 times. Back Arches.  Lie on your stomach. Prop yourself up on bent elbows. Slowly press on your hands, causing an arch in your low back. Repeat 3 to 5 times. Shoulder-Lifts.  Lie face down with arms beside your body. Keep hips and belly pressed to floor as you slowly lift your head and shoulders off the floor. Do not overdo your exercises. Be careful in the beginning. Exercises may cause you some mild back discomfort. If the pain lasts for more than 15 minutes, stop the exercises until you see your doctor. Improvement with exercise for back problems is slow.  Document Released: 04/25/2010 Document Revised: 06/15/2011 Document Reviewed: 01/22/2011 [facility] Patient Information 2014 [place], [place]. Back Pain, Adult Back pain is very common. The pain often gets better over time. The cause of back pain is usually not dangerous. Most people can learn to manage their back pain on their own.  HOME CARE   Stay active. Start with short walks on flat ground if you can. Try to walk farther each day.  Do not sit, drive, or stand in one place for more than 30 minutes. Do not stay in bed.  Do not avoid exercise or work. Activity can help your back heal faster.  Be careful when you bend or lift an object. Bend at your knees, keep the object close to you, and do not twist.  Sleep on a firm mattress. Lie on your side, and bend your knees. If you lie on your back, put a pillow under your knees.  Only take medicines as told by your doctor.  Put ice on the injured area.  Put ice in a plastic bag.  Place a towel between your skin and the bag.  Leave the ice on for 15-20 minutes, 03-04 times a day for the first 2 to 3 days. After  that, you can switch between ice and heat packs.  Ask your doctor about back exercises or massage.  Avoid feeling anxious or stressed. Find good ways to deal with stress, such as exercise. GET HELP RIGHT AWAY IF:   Your pain does not go away with rest or medicine.  Your pain does not go away in 1 week.  You have new problems.  You do not feel well.  The pain spreads into your legs.  You cannot control when you poop (bowel movement) or pee (urinate).  Your arms or legs feel weak or lose feeling (numbness).  You feel sick to your stomach (nauseous) or throw up (vomit).  You have belly (abdominal) pain.  You feel like you may pass out (faint). MAKE SURE YOU:   Understand these instructions.  Will watch your condition.  Will get help right away if you are not doing well or get worse. Document Released: 09/09/2007 Document Revised: 06/15/2011 Document Reviewed: 08/11/2010 [facility] Patient Information 2014 [place], [place].   Emergency Department Resource Guide 1) Find a Doctor and Pay Out of Pocket Although you won't have to find out who is covered by your insurance plan, it is a good idea to ask around and get recommendations. You will then need to call the office and see if the doctor you have chosen will accept you as a new patient and what types of options they offer for patients who are self-pay. Some doctors offer discounts or will set up payment plans for their patients who do not have insurance, but you will need to ask so you aren't surprised when you get to your appointment.  2) Contact Your Local Health Department Not all health departments have doctors that can see patients for sick visits, but many do, so it is worth a call to see if yours does. If you don't know where your local health department is, you can check in your phone book. The CDC also has a tool to help you locate your state's health department, and many state websites also have listings of all of their  local health departments.  3) Find a Walk-in Clinic If your illness is not likely to be very severe or complicated, you may want to try a walk in clinic. These are popping up all over the country in pharmacies, drugstores, and shopping centers. They're usually staffed by nurse practitioners or physician assistants that have been trained to treat common illnesses and complaints. They're usually fairly quick and inexpensive. However, if you have serious medical issues or chronic medical problems, these are probably not your best option.  No Primary Care Doctor: - Call Health Connect at  [phone] - they can help you locate a primary care doctor that  accepts your insurance, provides certain services, etc. - Physician Referral Service- [phone]  Chronic Pain Problems: Organization         Address  Phone  Notes  [facility]  [phone] Patients need to be referred by their primary care doctor.   Medication Assistance: Organization         Address  Phone   Notes  [facility] Medication [facility] [address] [place], [place] [address] [phone] --Must be a resident of [facility] -- Must have NO insurance coverage whatsoever (no Medicaid/ Medicare, etc.) -- The pt. MUST have a primary care doctor that directs their care regularly and follows them in the community   MedAssist  [phone]   [employer]  [phone]) [phone]    Agencies that provide inexpensive medical care: Organization         Address  Phone   Notes  [facility]  [phone]   [facility]    [phone]   [facility] [address] [place], [place] [address] [phone]   [facility] [address], [place] [phone]   Planned Parenthood    [phone]) [phone]   [facility]    [phone](336) [phone]   [facility]  [address], [place]  Phone:  [phone](336) [phone], Fax:  [phone] Hours of Operation:  9 am - 6 pm, M-F.  Also accepts Medicaid/Medicare and self-pay.  [facility]  [address], [place] Phone: [phone], Fax: [phone]. Hours of Operation:  8:30 am - 5:30 pm, M-F.  Also accepts Medicaid and self-pay.  [facility] [place] [address], [place] Phone: [phone]   [facility] [address], [place], [place] [phone], Ext. [phone] Mondays & Thursdays: 7-9 AM.  First 15 patients are seen on a first come, first serve basis.    Medicaid-accepting [facility] Providers:  Organization         Address  Phone   Notes  [facility] [address], [place] [phone] Also accepts self-pay patients.  [facility] [address], [place]  [phone]) [phone]   [facility] [address], [address], [place] [phone]) [phone]   [facility] [address] [place] [address], [place] [phone]) [phone]   Renaye RakersVeita Bland [address], [place]   [phone]) [phone] Only accepts [place] Access [place] patients after they have their name applied to their card.   Self-Pay (no insurance) in [facility]:  Organization         Address  Phone   Notes  Sickle Cell Patients, [facility] [address], [place] [phone]   [facility] [address], [place] [phone]   [facility] [place]  [address], [place] [phone]   [facility]/Dr. Osei-Bonsu  [address] [place] [address], [place] or [phone], [place] [phone] Phone number for both [place] and [place] locations is the same.  [facility] [address], [place] [phone]   [facility] [address] [place] [address], [place] or [address] [phone]   [facility] [address], [place] [phone], phone; [phone], fax Sees patients 1st and 3rd Saturday of every month.  Must not qualify for public or private insurance (  i.e. Medicaid, Medicare, [place] Health Choice, Veterans' Benefits)  Household income should be no more than 200% of the poverty level The clinic cannot treat you if you are pregnant or think you are pregnant  Sexually transmitted diseases are not treated at the clinic.    Dental Care: Organization         Address  Phone  Notes  [facility] Department of [facility] [address] [place], [place] [phone] Accepts children up to age 64 who are enrolled in [place] or [place] Health Choice; pregnant women with a Medicaid card; and children who have applied for Medicaid or [place] Health Choice, but were declined, whose parents can pay a reduced fee at time of service.  [facility] of [facility]  [address], [place] [phone] Accepts children up to age 63 who are enrolled in [place] or [place] Health Choice; pregnant women with a Medicaid card; and children who have applied for Medicaid or [place] Health Choice, but were declined, whose parents can pay a reduced fee at time of service.  [facility]  [address] [place], [place] [phone] Patients are seen by appointment only. Walk-ins are not accepted. [facility] will see patients 77 years of age and older. Monday - Tuesday (8am-5pm) Most Wednesdays (8:30-5pm) $30 per visit, cash only  [facility] Adult Dental Access PROGRAM  [address], [facility] [phone] Patients are seen by appointment only. Walk-ins are not accepted. [facility] will see patients 3 years of age and older. One Wednesday Evening (Monthly: Volunteer Based).  $30 per visit, cash only  [employer]  [phone] for  adults; Children under age 70, call Graduate Pediatric Dentistry at [phone]. Children aged 19-14, please call [phone] to request a pediatric application.  Dental services are provided in all areas of dental care including fillings, crowns and bridges, complete and partial dentures, implants, gum treatment, root canals, and extractions. Preventive care is also provided. Treatment is provided to both adults and children. Patients are selected via a lottery and there is often a waiting list.   [facility] [address] [place]  [phone] [email or web address]   [facility] [address] [place], [place] [phone], Ext. [phone] Second and Fourth Thursday of each month, opens at 6:30 AM; Clinic ends at 9 AM.  Patients are seen on a first-come first-served basis, and a limited number are seen during each clinic.   [facility]  [address] [place] [place], [place] [phone]   Eligibility Requirements You must have lived in [place], [place], or [place] counties for at least the last three months.   You cannot be eligible for state or federal sponsored [place], including [employer], [place], or [employer].   You generally cannot be eligible for healthcare insurance through your employer.    How to apply: Eligibility screenings are held every Tuesday and Wednesday afternoon from 1:00 pm until 4:00 pm. You do not need an appointment for the interview!  [facility] [address], [place], [place] [phone]   [facility] Health Department  [phone]   [facility] Health Department  [phone]   [facility]  [phone]    Behavioral Health Resources in the Community: Intensive Outpatient Programs Organization         Address  Phone  Notes  [facility]  Health Services [address], [place], [place] [phone]   [facility]  [address], [place], [place] [phone]   [facility] [address], [place], [place]  [phone]   [facility] [address],  [place], [place] [phone] or [phone]   Substance Abuse Resources Organization         Address  Phone  Notes  [facility]  [phone]   [facility]  [phone]   [facility]  [phone]   [facility]  [phone]   Residential & Outpatient Substance Abuse Program  [phone]   Psychological Services Organization         Address  Phone  Notes  [facility]  [phone]- [phone]   [facility]  [phone]- [phone]   [facility] [address], [place] [phone] or [phone]    Mobile Crisis Teams Organization         Address  Phone  Notes  [facility], Mobile Crisis Care Unit  [phone]   [facility]  [address] [place], [place] [phone]   Doristine LocksSharon DeEsch [address] [place] [phone]    Self-Help/Support Groups Organization         Address  Phone             Notes  [facility] of [place] - variety of support groups  [phone]- [phone] Call for more information  Narcotics Anonymous (NA), Caring [facility] [address], [place]  2 meetings at this location   Statisticianesidential Treatment Programs Organization         Address  Phone  Notes  [facility] [address],    [place]  [phone]   [facility]  [address], [place] [phone], [place], [place] [phone]   [facility] [address], [place] [phone] Admissions: 8am-3pm M-F  [facility] [address]    [place], [place] [phone]   [facility] [address], [place], [place] [phone]   [facility] [address]  [place], [place] [phone]   [facility]  - Intensive Outpatient [address], [place], [place] [phone]   [facility] ([facility] [address]  [place], [place] [phone] or [phone]   [facility] ([facility]) [address] [place], [place] [phone] Accepts Medicaid  [facility] [address]  [place] [phone] Substance Abuse/Addiction Treatment   [facility] Behavioral Health Resources Organization         Address  Phone  Notes  [facility]  [phone]   Angie FavaJulie Brannon, PhD [address], [address] [place], [place]   [phone]) [phone] or [phone]) [phone]   [facility]   [address] [place], [place] [phone](336) [phone]   [facility] [address], [place], [place] [phone] Insurance/Medicaid/sponsorship through [facility]  [facility]ith and Families [address]                                    [place], [place] [phone] Therapy/tele-psych/case  [facility] [address].   [place], [place] [phone]) [phone]    Dr. Lolly MustacheArfeen  [phone]) [phone]   [facility]  [employer] [facility] 1)  [address],  2) [place] 3)  [address], [place] [phone]  [phone]   [place] [phone] or [phone] (After Hours)

## 2013-08-08 NOTE — ED Notes (Signed)
Pt. Walked out with steady gait. Ambulated without assistance and in no apparent distress.

## 2013-08-10 NOTE — ED Provider Notes (Signed)
Medical screening examination/treatment/procedure(s) were performed by non-physician practitioner and as supervising physician I was immediately available for consultation/collaboration.  Emmali Karow Y. Cole Eastridge, MD 08/10/13 2255 

## 2013-09-18 ENCOUNTER — Emergency Department (HOSPITAL_COMMUNITY): Payer: Medicaid Other

## 2013-09-18 ENCOUNTER — Emergency Department (HOSPITAL_COMMUNITY)
Admission: EM | Admit: 2013-09-18 | Discharge: 2013-09-18 | Disposition: A | Discharge status: Home / Self Care | Payer: Medicaid Other | Attending: Emergency Medicine | Admitting: Emergency Medicine

## 2013-09-18 ENCOUNTER — Encounter (HOSPITAL_COMMUNITY): Payer: Self-pay | Admitting: Emergency Medicine

## 2013-09-18 DIAGNOSIS — IMO0002 Reserved for concepts with insufficient information to code with codable children: Secondary | ICD-10-CM | POA: Insufficient documentation

## 2013-09-18 DIAGNOSIS — Z79899 Other long term (current) drug therapy: Secondary | ICD-10-CM | POA: Insufficient documentation

## 2013-09-18 DIAGNOSIS — G40909 Epilepsy, unspecified, not intractable, without status epilepticus: Secondary | ICD-10-CM | POA: Insufficient documentation

## 2013-09-18 DIAGNOSIS — G8929 Other chronic pain: Secondary | ICD-10-CM | POA: Insufficient documentation

## 2013-09-18 DIAGNOSIS — M549 Dorsalgia, unspecified: Secondary | ICD-10-CM

## 2013-09-18 DIAGNOSIS — F17200 Nicotine dependence, unspecified, uncomplicated: Secondary | ICD-10-CM | POA: Insufficient documentation

## 2013-09-18 MED ORDER — OXYCODONE-ACETAMINOPHEN 5-325 MG PO TABS
1.0000 | ORAL_TABLET | Freq: Once | ORAL | Status: AC
  Administered 2013-09-18: 1 via ORAL
  Filled 2013-09-18: qty 1

## 2013-09-18 MED ORDER — HYDROCODONE-ACETAMINOPHEN 5-325 MG PO TABS: 1.0000 | ORAL_TABLET | ORAL | Status: DC | PRN
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
## 2013-09-18 NOTE — Discharge Instructions (Signed)
Take the prescribed medication as directed for pain. °Return to the ED for new or worsening symptoms. ° °

## 2013-09-18 NOTE — ED Notes (Signed)
Patient was educated not to drive, operate heavy machinery, or drink alcohol while taking narcotic medication.  

## 2013-09-21 NOTE — ED Provider Notes (Signed)
Medical screening examination/treatment/procedure(s) were performed by non-physician practitioner and as supervising physician I was immediately available for consultation/collaboration.   Dash Cardarelli T Deardra Hinkley, MD 09/21/13 1506 

## 2013-10-05 ENCOUNTER — Emergency Department (HOSPITAL_COMMUNITY): Payer: Medicaid Other

## 2013-10-05 ENCOUNTER — Encounter (HOSPITAL_COMMUNITY): Payer: Self-pay | Admitting: Emergency Medicine

## 2013-10-05 ENCOUNTER — Emergency Department (HOSPITAL_COMMUNITY)
Admission: EM | Admit: 2013-10-05 | Discharge: 2013-10-05 | Disposition: A | Discharge status: Home / Self Care | Payer: Medicaid Other | Attending: Emergency Medicine | Admitting: Emergency Medicine

## 2013-10-05 DIAGNOSIS — Z79899 Other long term (current) drug therapy: Secondary | ICD-10-CM | POA: Insufficient documentation

## 2013-10-05 DIAGNOSIS — G40909 Epilepsy, unspecified, not intractable, without status epilepticus: Secondary | ICD-10-CM | POA: Diagnosis not present

## 2013-10-05 DIAGNOSIS — M2010 Hallux valgus (acquired), unspecified foot: Secondary | ICD-10-CM | POA: Insufficient documentation

## 2013-10-05 DIAGNOSIS — R269 Unspecified abnormalities of gait and mobility: Secondary | ICD-10-CM | POA: Insufficient documentation

## 2013-10-05 DIAGNOSIS — G8929 Other chronic pain: Secondary | ICD-10-CM | POA: Insufficient documentation

## 2013-10-05 DIAGNOSIS — F17200 Nicotine dependence, unspecified, uncomplicated: Secondary | ICD-10-CM | POA: Diagnosis not present

## 2013-10-05 DIAGNOSIS — M2011 Hallux valgus (acquired), right foot: Secondary | ICD-10-CM

## 2013-10-05 DIAGNOSIS — M25579 Pain in unspecified ankle and joints of unspecified foot: Secondary | ICD-10-CM | POA: Diagnosis present

## 2013-10-05 MED ORDER — TRAMADOL HCL 50 MG PO TABS: 50.0000 mg | ORAL_TABLET | Freq: Four times a day (QID) | ORAL | Status: DC | PRN

## 2013-10-05 NOTE — ED Notes (Signed)
Swelling and pain in r/foot x 3 months. Red raised area at base on 1st toe on r/foot. Pain increased today

## 2013-10-05 NOTE — ED Provider Notes (Signed)
CSN: [id number]     Arrival date & time 10/05/13  1611 History  This chart was scribed for Brandon Blake Magdiel Bartles PA-C working with Harrold DonathNathan R. Rubin PayorPickering, MD by Ashley JacobsBrittany Andrews, ED scribe. This patient was seen in room WTR5/WTR5 and the patient's care was started at 5:05 PM.  None    Chief Complaint  Patient presents with  . Foot Pain    pain in inner aspect of r/foot     (Consider location/radiation/quality/duration/timing/severity/associated sxs/prior Treatment) Patient is a 33 y.o. male presenting with lower extremity pain. The history is provided by the patient and medical records. No language interpreter was used.  Foot Pain  Foot Pain Associated symptoms include arthralgias and myalgias.   HPI Comments: Brandon Blake is a 33 y.o. male who presents to the Emergency Department complaining of constant, moderate, right foot pain, onset three months ago, that is much worse today.  He has noticed a bump at the MTP of the right toe. Pt mentions that he is having trouble walking for the past week due to pain. Pt explains that his boss told him to go home due to limping. He has tried Goody's powder, Tylenol and Excedrin without relief.   Denies fever. He has tingling to his foot immediately after work which resolves with elevating his foot. Denies hx of Gout. Denies injury or trauma.    Past Medical History  Diagnosis Date  . Back pain, chronic   . Seizures    Past Surgical History  Procedure Laterality Date  . Cholecystectomy     Family History  Problem Relation Age of Onset  . Cancer Father   . Diabetes Other   . Coronary artery disease Other    History  Substance Use Topics  . Smoking status: Current Every Day Smoker -- 0.50 packs/day    Types: Cigarettes  . Smokeless tobacco: Not on file  . Alcohol Use: Yes     Comment: social    Review of Systems  Musculoskeletal: Positive for arthralgias, gait problem and myalgias.  All other systems reviewed and are  negative.     Allergies  Naproxen  Home Medications   Prior to Admission medications   Medication Sig Start Date End Date Taking? Authorizing Provider  acetaminophen (TYLENOL) 325 MG tablet Take 325 mg by mouth every 6 (six) hours as needed (pain).    Historical Provider, MD  HYDROcodone-acetaminophen (NORCO/VICODIN) 5-325 MG per tablet Take 1 tablet by mouth every 4 (four) hours as needed. 09/18/13   Garlon HatchetLisa M Sanders, PA-C  ibuprofen (ADVIL,MOTRIN) 200 MG tablet Take 200 mg by mouth every 6 (six) hours as needed (pain).    Historical Provider, MD  levETIRAcetam (KEPPRA) 500 MG tablet Take 1,500 mg by mouth 2 (two) times daily.     Historical Provider, MD  phenytoin (DILANTIN) 200 MG ER capsule Take 400 mg by mouth 2 (two) times daily.    Historical Provider, MD   BP 134/79  Pulse 77  Temp(Src) 99.7 F (37.6 C) (Oral)  Resp 16  SpO2 98% Physical Exam  Nursing note and vitals reviewed. Constitutional: He appears well-developed and well-nourished.  HENT:  Head: Normocephalic and atraumatic.  Cardiovascular: Normal rate, regular rhythm and normal heart sounds.   Pulses:      Dorsalis pedis pulses are 2+ on the right side.  2+ DP pulse     Pulmonary/Chest: Effort normal and breath sounds normal.  Musculoskeletal: Normal range of motion.  Bunion of the right foot.  No erythema, edema,  or warmth of the right foot.  Neurological: He is alert.  Distal sensation of the right toes are intact.   Skin: Skin is warm and dry.  Psychiatric: He has a normal mood and affect.    ED Course  Procedures (including critical care time) DIAGNOSTIC STUDIES: Oxygen Saturation is 98% on room air, normal by my interpretation.    COORDINATION OF CARE:  5:09 PM Discussed course of care with pt which includes x-ray of his right foot. Pt understands and agrees. 5:57 PM Reviewed results of x-ray with pt. Pt understands and is ready for discharge.   Labs Review Labs Reviewed - No data to  display  Imaging Review Dg Foot Complete Right  10/05/2013   CLINICAL DATA:  Medial foot pain.  EXAM: RIGHT FOOT COMPLETE - 3+ VIEW  COMPARISON:  None.  FINDINGS: The joint spaces are maintained. No acute bony abnormality. There is a mild hallux valgus deformity. Small density near the tip of the medial malleolus is likely an old avulsion injury.  IMPRESSION: No acute bony findings.   Electronically Signed   By: Loralie ChampagneMark  Gallerani M.D.   On: 10/05/2013 17:49     EKG Interpretation None      MDM   Final diagnoses:  None   Patient with a bunion of the right foot.  No signs of infection.  Patient instructed to wear loose shoes.  Patient given referral to Podiatry.  Patient stable for discharge.  Return precautions given.    Brandon Blake Tiburcio Linder, PA-C 10/05/13 2314

## 2013-10-05 NOTE — Discharge Instructions (Signed)
Bunion You have a bunion deformity of the feet. This is more common in women. It tends to be an inherited problem. Symptoms can include pain, swelling, and deformity around the great toe. Numbness and tingling may also be present. Your symptoms are often worsened by wearing shoes that cause pressure on the bunion. Changing the type of shoes you wear helps reduce symptoms. A wide shoe decreases pressure on the bunion. An arch support may be used if you have flat feet. Avoid shoes with heels higher than two inches. This puts more pressure on the bunion. X-rays may be helpful in evaluating the severity of the problem. Other foot problems often seen with bunions include corns, calluses, and hammer toes. If the deformity or pain is severe, surgical treatment may be necessary. Keep off your painful foot as much as possible until the pain is relieved. Call your caregiver if your symptoms are worse.  SEEK IMMEDIATE MEDICAL CARE IF:  You have increased redness, pain, swelling, or other symptoms of infection. Document Released: 03/23/2005 Document Revised: 06/15/2011 Document Reviewed: 09/20/2006 Exit[employer] Patient Information 2015 [employer]. This information is not intended to replace advice given to you by your health care provider. Make sure you discuss any questions you have with your health care provider.  

## 2013-10-06 NOTE — ED Provider Notes (Signed)
Medical screening examination/treatment/procedure(s) were performed by non-physician practitioner and as supervising physician I was immediately available for consultation/collaboration.   EKG Interpretation None       Zhaire Locker R. Rhetta Cleek, MD 10/06/13 0002 

## 2013-10-23 ENCOUNTER — Emergency Department (HOSPITAL_COMMUNITY)
Admission: EM | Admit: 2013-10-23 | Discharge: 2013-10-23 | Disposition: A | Discharge status: Home / Self Care | Payer: Medicaid Other | Attending: Emergency Medicine | Admitting: Emergency Medicine

## 2013-10-23 ENCOUNTER — Emergency Department (HOSPITAL_COMMUNITY): Payer: Medicaid Other

## 2013-10-23 ENCOUNTER — Encounter (HOSPITAL_COMMUNITY): Payer: Self-pay | Admitting: Emergency Medicine

## 2013-10-23 DIAGNOSIS — Z79899 Other long term (current) drug therapy: Secondary | ICD-10-CM | POA: Diagnosis not present

## 2013-10-23 DIAGNOSIS — G40909 Epilepsy, unspecified, not intractable, without status epilepticus: Secondary | ICD-10-CM | POA: Insufficient documentation

## 2013-10-23 DIAGNOSIS — M545 Low back pain, unspecified: Secondary | ICD-10-CM | POA: Insufficient documentation

## 2013-10-23 DIAGNOSIS — F17200 Nicotine dependence, unspecified, uncomplicated: Secondary | ICD-10-CM | POA: Diagnosis not present

## 2013-10-23 DIAGNOSIS — M549 Dorsalgia, unspecified: Secondary | ICD-10-CM | POA: Diagnosis present

## 2013-10-23 DIAGNOSIS — G8929 Other chronic pain: Secondary | ICD-10-CM | POA: Insufficient documentation

## 2013-10-23 MED ORDER — OXYCODONE-ACETAMINOPHEN 5-325 MG PO TABS
2.0000 | ORAL_TABLET | Freq: Once | ORAL | Status: AC
  Administered 2013-10-23: 2 via ORAL
  Filled 2013-10-23: qty 2

## 2013-10-23 MED ORDER — METHOCARBAMOL 500 MG PO TABS: 500.0000 mg | ORAL_TABLET | Freq: Two times a day (BID) | ORAL | Status: DC

## 2013-10-23 MED ORDER — HYDROCODONE-ACETAMINOPHEN 5-325 MG PO TABS: 1.0000 | ORAL_TABLET | Freq: Four times a day (QID) | ORAL | Status: DC | PRN

## 2013-10-23 NOTE — ED Provider Notes (Signed)
Medical screening examination/treatment/procedure(s) were performed by non-physician practitioner and as supervising physician I was immediately available for consultation/collaboration.   EKG Interpretation None        Lemon Sternberg, MD 10/23/13 2327 

## 2013-10-23 NOTE — Discharge Instructions (Signed)
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
## 2013-11-12 ENCOUNTER — Emergency Department (HOSPITAL_COMMUNITY)
Admission: EM | Admit: 2013-11-12 | Discharge: 2013-11-13 | Disposition: A | Discharge status: Home / Self Care | Payer: Medicaid Other | Attending: Emergency Medicine | Admitting: Emergency Medicine

## 2013-11-12 ENCOUNTER — Encounter (HOSPITAL_COMMUNITY): Payer: Self-pay | Admitting: Emergency Medicine

## 2013-11-12 DIAGNOSIS — G8929 Other chronic pain: Secondary | ICD-10-CM | POA: Insufficient documentation

## 2013-11-12 DIAGNOSIS — G40909 Epilepsy, unspecified, not intractable, without status epilepticus: Secondary | ICD-10-CM | POA: Insufficient documentation

## 2013-11-12 DIAGNOSIS — S30861A Insect bite (nonvenomous) of abdominal wall, initial encounter: Secondary | ICD-10-CM

## 2013-11-12 DIAGNOSIS — F17200 Nicotine dependence, unspecified, uncomplicated: Secondary | ICD-10-CM | POA: Insufficient documentation

## 2013-11-12 DIAGNOSIS — Y9389 Activity, other specified: Secondary | ICD-10-CM | POA: Insufficient documentation

## 2013-11-12 DIAGNOSIS — S30860A Insect bite (nonvenomous) of lower back and pelvis, initial encounter: Secondary | ICD-10-CM | POA: Insufficient documentation

## 2013-11-12 DIAGNOSIS — W57XXXA Bitten or stung by nonvenomous insect and other nonvenomous arthropods, initial encounter: Principal | ICD-10-CM | POA: Insufficient documentation

## 2013-11-12 DIAGNOSIS — Y9289 Other specified places as the place of occurrence of the external cause: Secondary | ICD-10-CM | POA: Insufficient documentation

## 2013-11-12 DIAGNOSIS — Z79899 Other long term (current) drug therapy: Secondary | ICD-10-CM | POA: Insufficient documentation

## 2013-11-12 DIAGNOSIS — R21 Rash and other nonspecific skin eruption: Secondary | ICD-10-CM | POA: Insufficient documentation

## 2013-11-12 NOTE — ED Notes (Signed)
Pt arrived  To the ED with a complaint of a tick bite that has become possibly infected.  Pt removed a tick from his stomacha week ago but the bite has swollen and gotten red.

## 2013-11-13 MED ORDER — DOXYCYCLINE HYCLATE 100 MG PO CAPS: 100.0000 mg | ORAL_CAPSULE | Freq: Two times a day (BID) | ORAL | Status: DC

## 2013-11-13 MED ORDER — HYDROCODONE-ACETAMINOPHEN 5-325 MG PO TABS: 1.0000 | ORAL_TABLET | Freq: Four times a day (QID) | ORAL | Status: DC | PRN

## 2013-11-13 NOTE — Discharge Instructions (Signed)
Tick Bite Information Ticks are insects that attach themselves to the skin and draw blood for food. There are various types of ticks. Common types include wood ticks and deer ticks. Most ticks live in shrubs and grassy areas. Ticks can climb onto your body when you make contact with leaves or grass where the tick is waiting. The most common places on the body for ticks to attach themselves are the scalp, neck, armpits, waist, and groin. Most tick bites are harmless, but sometimes ticks carry germs that cause diseases. These germs can be spread to a person during the tick's feeding process. The chance of a disease spreading through a tick bite depends on:   The type of tick.  Time of year.   How long the tick is attached.   Geographic location.  HOW CAN YOU PREVENT TICK BITES? Take these steps to help prevent tick bites when you are outdoors:  Wear protective clothing. Long sleeves and long pants are best.   Wear white clothes so you can see ticks more easily.  Tuck your pant legs into your socks.   If walking on a trail, stay in the middle of the trail to avoid brushing against bushes.  Avoid walking through areas with long grass.  Put insect repellent on all exposed skin and along boot tops, pant legs, and sleeve cuffs.   Check clothing, hair, and skin repeatedly and before going inside.   Brush off any ticks that are not attached.  Take a shower or bath as soon as possible after being outdoors.  WHAT IS THE PROPER WAY TO REMOVE A TICK? Ticks should be removed as soon as possible to help prevent diseases caused by tick bites. 1. If latex gloves are available, put them on before trying to remove a tick.  2. Using fine-point tweezers, grasp the tick as close to the skin as possible. You may also use curved forceps or a tick removal tool. Grasp the tick as close to its head as possible. Avoid grasping the tick on its body. 3. Pull gently with steady upward pressure until  the tick lets go. Do not twist the tick or jerk it suddenly. This may break off the tick's head or mouth parts. 4. Do not squeeze or crush the tick's body. This could force disease-carrying fluids from the tick into your body.  5. After the tick is removed, wash the bite area and your hands with soap and water or other disinfectant such as alcohol. 6. Apply a small amount of antiseptic cream or ointment to the bite site.  7. Wash and disinfect any instruments that were used.  Do not try to remove a tick by applying a hot match, petroleum jelly, or fingernail polish to the tick. These methods do not work and may increase the chances of disease being spread from the tick bite.  WHEN SHOULD YOU SEEK MEDICAL CARE? Contact your health care provider if you are unable to remove a tick from your skin or if a part of the tick breaks off and is stuck in the skin.  After a tick bite, you need to be aware of signs and symptoms that could be related to diseases spread by ticks. Contact your health care provider if you develop any of the following in the days or weeks after the tick bite:  Unexplained fever.  Rash. A circular rash that appears days or weeks after the tick bite may indicate the possibility of Lyme disease. The rash may resemble   a target with a bull's-eye and may occur at a different part of your body than the tick bite.  Redness and swelling in the area of the tick bite.   Tender, swollen lymph glands.   Diarrhea.   Weight loss.   Cough.   Fatigue.   Muscle, joint, or bone pain.   Abdominal pain.   Headache.   Lethargy or a change in your level of consciousness.  Difficulty walking or moving your legs.   Numbness in the legs.   Paralysis.  Shortness of breath.   Confusion.   Repeated vomiting.  Document Released: 03/20/2000 Document Revised: 01/11/2013 Document Reviewed: 08/31/2012 [employer] Patient Information 2015 [employer]. This information is  not intended to replace advice given to you by your health care provider. Make sure you discuss any questions you have with your health care provider.  

## 2013-11-13 NOTE — ED Provider Notes (Signed)
Medical screening examination/treatment/procedure(s) were performed by non-physician practitioner and as supervising physician I was immediately available for consultation/collaboration.   EKG Interpretation None        Lassie Demorest F Kelden Lavallee, MD 11/13/13 0739 

## 2013-11-13 NOTE — ED Provider Notes (Signed)
CSN: [id number]     Arrival date & time 11/12/13  2314 History   First MD Initiated Contact with Patient 11/13/13 0012     Chief Complaint  Patient presents with  . Insect Bite     (Consider location/radiation/quality/duration/timing/severity/associated sxs/prior Treatment) HPI  33 year old male presents for evaluation of a tick bite.   a week ago he was outside cutting grass, approximately 3 hours later he noticed a tick embedded on his lower abdomen.  He was able to remove it without difficulty.  For the past 3-4 days he notice pain, swelling, redness at the bite site.  Pain is sharp, throbbing, worsening with palpation and not improves with OTC medication.  He report intermittent bouts of headache, but no active headache at this time.  Denies fever, n/v/d, cp, sob, back pain, numbness, weakness, or difficulty thinking.  Has been bitten by tick in the past without complications.  No recent travel.    Past Medical History  Diagnosis Date  . Back pain, chronic   . Seizures    Past Surgical History  Procedure Laterality Date  . Cholecystectomy     Family History  Problem Relation Age of Onset  . Cancer Father   . Diabetes Other   . Coronary artery disease Other    History  Substance Use Topics  . Smoking status: Current Every Day Smoker -- 0.50 packs/day    Types: Cigarettes  . Smokeless tobacco: Not on file  . Alcohol Use: Yes     Comment: social    Review of Systems  Constitutional: Negative for fever.  Gastrointestinal: Positive for abdominal pain.  Musculoskeletal: Negative for neck pain and neck stiffness.  Skin: Positive for rash.  Neurological: Positive for headaches.      Allergies  Naproxen  Home Medications   Prior to Admission medications   Medication Sig Start Date End Date Taking? Authorizing Provider  HYDROcodone-acetaminophen (NORCO/VICODIN) 5-325 MG per tablet Take 1 tablet by mouth every 6 (six) hours as needed for moderate pain or severe pain.  10/23/13  Yes Antony MaduraKelly Humes, PA-C  levETIRAcetam (KEPPRA) 500 MG tablet Take 1,500 mg by mouth 2 (two) times daily.    Yes Historical Provider, MD  methocarbamol (ROBAXIN) 500 MG tablet Take 1 tablet (500 mg total) by mouth 2 (two) times daily. 10/23/13  Yes Antony MaduraKelly Humes, PA-C  phenytoin (DILANTIN) 200 MG ER capsule Take 400 mg by mouth 2 (two) times daily.   Yes Historical Provider, MD  traMADol (ULTRAM) 50 MG tablet Take 1 tablet (50 mg total) by mouth every 6 (six) hours as needed. 10/05/13  Yes Heather Laisure, PA-C   BP 122/66  Pulse 83  Temp(Src) 98.6 F (37 C) (Oral)  Resp 20  Wt 260 lb (117.935 kg)  SpO2 99% Physical Exam  Constitutional: He is oriented to person, place, and time. He appears well-developed and well-nourished. No distress.  HENT:  Head: Atraumatic.  Mouth/Throat: Oropharynx is clear and moist.  Eyes: Conjunctivae are normal.  Neck: Normal range of motion. Neck supple.  No nuchal rigidity  Abdominal: Soft. There is tenderness (mid lower abdomen with an area of scab with surrounding erythema measuring 2x2cm without central clearing.  no induration or fluctuance.  no petechaie, pustular, or vesicular lesion.  ).  Neurological: He is alert and oriented to person, place, and time.  Skin: No rash (psoriatic rash noted to scalp, forearms, anterior lower abdomen.) noted.  Psychiatric: He has a normal mood and affect.  ED Course  Procedures (including critical care time)  12:22 AM Pt here with rash from a tick bite a week ago.  Tick  Was embedded no more than 3 hrs according pt pt.  No central clearing rash to suggest RMSF or Lyme disease.  He is afebrile, VSS.  Plan to treat sxs with doxy, and pain meds.  Standard return precaution discussed.    Labs Review Labs Reviewed - No data to display  Imaging Review No results found.   EKG Interpretation None      MDM   Final diagnoses:  Tick bite of abdomen, initial encounter    BP 122/66  Pulse 83  Temp(Src)  98.6 F (37 C) (Oral)  Resp 20  Wt 260 lb (117.935 kg)  SpO2 99%     Fayrene Helper, PA-C 11/13/13 1610

## 2013-12-21 ENCOUNTER — Emergency Department (HOSPITAL_COMMUNITY)
Admission: EM | Admit: 2013-12-21 | Discharge: 2013-12-21 | Disposition: A | Discharge status: Home / Self Care | Payer: Medicaid Other | Attending: Emergency Medicine | Admitting: Emergency Medicine

## 2013-12-21 ENCOUNTER — Encounter (HOSPITAL_COMMUNITY): Payer: Self-pay | Admitting: Emergency Medicine

## 2013-12-21 ENCOUNTER — Emergency Department (HOSPITAL_COMMUNITY): Payer: Medicaid Other

## 2013-12-21 DIAGNOSIS — S3992XA Unspecified injury of lower back, initial encounter: Secondary | ICD-10-CM

## 2013-12-21 DIAGNOSIS — G8929 Other chronic pain: Secondary | ICD-10-CM | POA: Insufficient documentation

## 2013-12-21 DIAGNOSIS — Z79899 Other long term (current) drug therapy: Secondary | ICD-10-CM | POA: Insufficient documentation

## 2013-12-21 DIAGNOSIS — Y9239 Other specified sports and athletic area as the place of occurrence of the external cause: Secondary | ICD-10-CM | POA: Insufficient documentation

## 2013-12-21 DIAGNOSIS — W2103XA Struck by baseball, initial encounter: Secondary | ICD-10-CM

## 2013-12-21 DIAGNOSIS — Z792 Long term (current) use of antibiotics: Secondary | ICD-10-CM | POA: Insufficient documentation

## 2013-12-21 DIAGNOSIS — IMO0002 Reserved for concepts with insufficient information to code with codable children: Secondary | ICD-10-CM | POA: Insufficient documentation

## 2013-12-21 DIAGNOSIS — F17200 Nicotine dependence, unspecified, uncomplicated: Secondary | ICD-10-CM | POA: Insufficient documentation

## 2013-12-21 DIAGNOSIS — G40909 Epilepsy, unspecified, not intractable, without status epilepticus: Secondary | ICD-10-CM | POA: Insufficient documentation

## 2013-12-21 DIAGNOSIS — W219XXA Striking against or struck by unspecified sports equipment, initial encounter: Secondary | ICD-10-CM | POA: Insufficient documentation

## 2013-12-21 DIAGNOSIS — Y92838 Other recreation area as the place of occurrence of the external cause: Secondary | ICD-10-CM

## 2013-12-21 DIAGNOSIS — Y9364 Activity, baseball: Secondary | ICD-10-CM | POA: Insufficient documentation

## 2013-12-21 MED ORDER — TRAMADOL HCL 50 MG PO TABS: 50.0000 mg | ORAL_TABLET | Freq: Four times a day (QID) | ORAL | Status: DC | PRN

## 2013-12-21 MED ORDER — HYDROCODONE-ACETAMINOPHEN 5-325 MG PO TABS
2.0000 | ORAL_TABLET | Freq: Once | ORAL | Status: AC
  Administered 2013-12-21: 2 via ORAL
  Filled 2013-12-21: qty 2

## 2013-12-21 NOTE — ED Provider Notes (Signed)
CSN: [id number]     Arrival date & time 12/21/13  1304 History  This chart was scribed for non-physician practitioner, Sharilyn Sites, PA-C working with Suzi Roots, MD by Greggory Stallion, ED scribe. This patient was seen in room WTR5/WTR5 and the patient's care was started at 1:57 PM.   Chief Complaint  Patient presents with  . Back Injury   The history is provided by the patient. No language interpreter was used.   HPI Comments: Brandon Blake is a 33 y.o. male with history of chronic back pain who presents to the Emergency Department complaining of mid back injury that occurred yesterday. Pt states he was hit in the back with a softball. No falls, head injuries, or LOC.  No other injuries noted.  Pressure over the area worsens the pain. Pt has tried icy hot, ice and heating pads with no relief.   No numbness, paresthesias or weakness of extremities.  No loss of bowel or bladder control.  Past Medical History  Diagnosis Date  . Back pain, chronic   . Seizures    Past Surgical History  Procedure Laterality Date  . Cholecystectomy     Family History  Problem Relation Age of Onset  . Cancer Father   . Diabetes Other   . Coronary artery disease Other    History  Substance Use Topics  . Smoking status: Current Every Day Smoker -- 0.50 packs/day    Types: Cigarettes  . Smokeless tobacco: Not on file  . Alcohol Use: Yes     Comment: social    Review of Systems  Musculoskeletal: Positive for back pain.  All other systems reviewed and are negative.  Allergies  Naproxen  Home Medications   Prior to Admission medications   Medication Sig Start Date End Date Taking? Authorizing Provider  doxycycline (VIBRAMYCIN) 100 MG capsule Take 1 capsule (100 mg total) by mouth 2 (two) times daily. 11/13/13   Fayrene Helper, PA-C  HYDROcodone-acetaminophen (NORCO/VICODIN) 5-325 MG per tablet Take 1 tablet by mouth every 6 (six) hours as needed for moderate pain or severe pain. 11/13/13   Fayrene Helper, PA-C  levETIRAcetam (KEPPRA) 500 MG tablet Take 1,500 mg by mouth 2 (two) times daily.     Historical Provider, MD  methocarbamol (ROBAXIN) 500 MG tablet Take 1 tablet (500 mg total) by mouth 2 (two) times daily. 10/23/13   Antony Madura, PA-C  phenytoin (DILANTIN) 200 MG ER capsule Take 400 mg by mouth 2 (two) times daily.    Historical Provider, MD  traMADol (ULTRAM) 50 MG tablet Take 1 tablet (50 mg total) by mouth every 6 (six) hours as needed. 10/05/13   Heather Laisure, PA-C   BP 122/77  Pulse 61  Resp 16  SpO2 100%  Physical Exam  Nursing note and vitals reviewed. Constitutional: He is oriented to person, place, and time. He appears well-developed and well-nourished.  HENT:  Head: Normocephalic and atraumatic.  Mouth/Throat: Oropharynx is clear and moist.  Eyes: Conjunctivae and EOM are normal. Pupils are equal, round, and reactive to light.  Neck: Normal range of motion.  Cardiovascular: Normal rate, regular rhythm and normal heart sounds.   Pulmonary/Chest: Effort normal and breath sounds normal.  Musculoskeletal: Normal range of motion.       Cervical back: Normal.       Thoracic back: He exhibits tenderness, bony tenderness and pain.       Back:  Moderate sized bruise to mid-thoracic spine without bony deformity; no mid-line  step off noted; full ROM maintained but with some pain; BLE remains NVI; ambulating unassisted without difficulty  Neurological: He is alert and oriented to person, place, and time.  Skin: Skin is warm and dry.  Psychiatric: He has a normal mood and affect.    ED Course  Procedures (including critical care time)  COORDINATION OF CARE: 1:58 PM-Discussed treatment plan which includes xray and pain medication with pt at bedside and pt agreed to plan.   Labs Review Labs Reviewed - No data to display  Imaging Review Dg Thoracic Spine 2 View  12/21/2013   CLINICAL DATA:  Hit with baseball yesterday in center of mid back. Bruising and back pain  with some shortness of breath.  EXAM: THORACIC SPINE - 2 VIEW  COMPARISON:  08/07/2013  FINDINGS: No listhesis is identified, although the swimmer's view is suboptimal due to obliquity in the upper thoracic spine. There is apparent abnormal angulation and anterior widening of the C6-7 disc space on the swimmer's view. Thoracic vertebral body heights are preserved without evidence of compression fracture. Right upper quadrant surgical clip is noted. Visualized portions of the lungs are clear.  IMPRESSION: 1. No acute osseous abnormality identified in the thoracic spine. 2. Widening of the C6-7 disc space anteriorly. Further evaluation with dedicated cervical spine radiographs is recommended.   Electronically Signed   By: Sebastian Ache   On: 12/21/2013 14:05     EKG Interpretation None      MDM   Final diagnoses:  Back injury, initial encounter  Struck by baseball   Back pain after being hit with baseball.  No red flag sx or neurologic deficits on exam.  X-ray without acute fx.  Incidental findings noted of widening of C6-C7 without fx of subluxation.  Patient had no recent neck injuries, no current neck pain, weakness, numbness, paresthesias of UE or sx concerning for central cord syndrome.  Feel these cervical films may be done on non-emergent basis with PCP.  Patient treated with vicodin in the ED, tramadol for home.  Encouraged to continue ice/heat to help with pain.  Discussed plan with patient, he/she acknowledged understanding and agreed with plan of care.  Return precautions given for new or worsening symptoms.  Case discussed with attending physician, Dr. Denton Lank, who agrees with assessment and plan of care.  I personally performed the services described in this documentation, which was scribed in my presence. The recorded information has been reviewed and is accurate.   Garlon Hatchet, PA-C 12/21/13 1550

## 2013-12-21 NOTE — Discharge Instructions (Signed)
Take the prescribed medication as directed.  Continue to apply ice/heat to back to help with pain. As we discussed, it is recommended that you have x-rays of your cervical spine.  Your primary care physician can order these for you. Follow-up with your primary care physician. Return to the ED for new or worsening symptoms.

## 2013-12-21 NOTE — ED Notes (Signed)
Per pt, states he was hit in the middle of back with baseball-increased pain

## 2013-12-22 NOTE — ED Provider Notes (Signed)
Medical screening examination/treatment/procedure(s) were performed by non-physician practitioner and as supervising physician I was immediately available for consultation/collaboration.    Meer Reindl E Adeola Dennen, MD 12/22/13 0729 

## 2013-12-26 ENCOUNTER — Emergency Department (HOSPITAL_COMMUNITY)
Admission: EM | Admit: 2013-12-26 | Discharge: 2013-12-27 | Disposition: A | Discharge status: Home / Self Care | Payer: Medicaid Other | Attending: Emergency Medicine | Admitting: Emergency Medicine

## 2013-12-26 ENCOUNTER — Encounter (HOSPITAL_COMMUNITY): Payer: Self-pay | Admitting: Emergency Medicine

## 2013-12-26 DIAGNOSIS — G8929 Other chronic pain: Secondary | ICD-10-CM | POA: Insufficient documentation

## 2013-12-26 DIAGNOSIS — Z79899 Other long term (current) drug therapy: Secondary | ICD-10-CM | POA: Insufficient documentation

## 2013-12-26 DIAGNOSIS — K921 Melena: Secondary | ICD-10-CM | POA: Insufficient documentation

## 2013-12-26 DIAGNOSIS — G40909 Epilepsy, unspecified, not intractable, without status epilepticus: Secondary | ICD-10-CM | POA: Insufficient documentation

## 2013-12-26 DIAGNOSIS — K625 Hemorrhage of anus and rectum: Secondary | ICD-10-CM | POA: Insufficient documentation

## 2013-12-26 DIAGNOSIS — Z8739 Personal history of other diseases of the musculoskeletal system and connective tissue: Secondary | ICD-10-CM | POA: Insufficient documentation

## 2013-12-26 DIAGNOSIS — F17200 Nicotine dependence, unspecified, uncomplicated: Secondary | ICD-10-CM | POA: Insufficient documentation

## 2013-12-26 LAB — COMPREHENSIVE METABOLIC PANEL
ALK PHOS: 64 U/L (ref 39–117)
ALT: 25 U/L (ref 0–53)
AST: 33 U/L (ref 0–37)
Albumin: 4.2 g/dL (ref 3.5–5.2)
Anion gap: 12 (ref 5–15)
BUN: 13 mg/dL (ref 6–23)
CO2: 27 meq/L (ref 19–32)
Calcium: 9.5 mg/dL (ref 8.4–10.5)
Chloride: 100 mEq/L (ref 96–112)
Creatinine, Ser: 0.82 mg/dL (ref 0.50–1.35)
GFR calc Af Amer: >90 mL/min (ref >90)
GFR calc non Af Amer: >90 mL/min (ref >90)
Glucose, Bld: 87 mg/dL (ref 70–99)
POTASSIUM: 3.8 meq/L (ref 3.7–5.3)
SODIUM: 139 meq/L (ref 137–147)
TOTAL PROTEIN: 7.8 g/dL (ref 6.0–8.3)
Total Bilirubin: 0.4 mg/dL (ref 0.3–1.2)

## 2013-12-26 LAB — CBC
HEMATOCRIT: 44.5 % (ref 39.0–52.0)
HEMOGLOBIN: 15.7 g/dL (ref 13.0–17.0)
MCH: 32.5 pg (ref 26.0–34.0)
MCHC: 35.3 g/dL (ref 30.0–36.0)
MCV: 92.1 fL (ref 78.0–100.0)
Platelets: 215 10*3/uL (ref 150–400)
RBC: 4.83 MIL/uL (ref 4.22–5.81)
RDW: 12.2 % (ref 11.5–15.5)
WBC: 11 10*3/uL — AB (ref 4.0–10.5)

## 2013-12-26 NOTE — ED Notes (Signed)
Pt states that he has had blood in his stool for about 1 week; pt states that there was blood around the stool and in the bowl; pt states tonight that there was more blood, blood clots and that the toilet water was all red from the blood; pt denies pain; pt c/o feeling weak and tired for several days

## 2013-12-27 LAB — TYPE AND SCREEN
ABO/RH(D): A POS
Antibody Screen: NEGATIVE

## 2013-12-27 LAB — ABO/RH: ABO/RH(D): A POS

## 2013-12-27 LAB — POC OCCULT BLOOD, ED: Fecal Occult Bld: NEGATIVE

## 2013-12-27 NOTE — ED Provider Notes (Signed)
CSN: [id number]     Arrival date & time 12/26/13  2114 History   First MD Initiated Contact with Patient 12/27/13 0100     Chief Complaint  Patient presents with  . Rectal Bleeding     (Consider location/radiation/quality/duration/timing/severity/associated sxs/prior Treatment) HPI  This is a 33 year old male who presents with complaints of bloody stools. Patient reports a one-week history of blood noted in his stools. He reports dark blood over the last several days and blood clots today filling the toilet with bright red blood. He denies any dizziness or syncope. He does report generalized fatigue. He denies any abdominal pain or vomiting. He does not take any ibuprofen or proximal end. No history of similar symptoms.  Patient has a rectal pain or diarrhea.  Denies recent travel.  Past Medical History  Diagnosis Date  . Back pain, chronic   . Seizures    Past Surgical History  Procedure Laterality Date  . Cholecystectomy     Family History  Problem Relation Age of Onset  . Cancer Father   . Diabetes Other   . Coronary artery disease Other    History  Substance Use Topics  . Smoking status: Current Every Day Smoker -- 0.50 packs/day    Types: Cigarettes  . Smokeless tobacco: Not on file  . Alcohol Use: Yes     Comment: social    Review of Systems  Constitutional: Negative.  Negative for fever.  Respiratory: Negative.  Negative for chest tightness and shortness of breath.   Cardiovascular: Negative.  Negative for chest pain.  Gastrointestinal: Positive for blood in stool. Negative for nausea, vomiting, abdominal pain, diarrhea and constipation.  Genitourinary: Negative.  Negative for dysuria.  Musculoskeletal: Negative for back pain.  Skin: Negative for rash.  Neurological: Negative for headaches.  All other systems reviewed and are negative.     Allergies  Naproxen  Home Medications   Prior to Admission medications   Medication Sig Start Date End Date  Taking? Authorizing Provider  levETIRAcetam (KEPPRA) 500 MG tablet Take 1,500 mg by mouth 2 (two) times daily.    Yes Historical Provider, MD  phenytoin (DILANTIN) 200 MG ER capsule Take 400 mg by mouth 2 (two) times daily.   Yes Historical Provider, MD  traMADol (ULTRAM) 50 MG tablet Take 1 tablet (50 mg total) by mouth every 6 (six) hours as needed. 12/21/13  Yes Garlon Hatchet, PA-C   BP 121/70  Pulse 60  Temp(Src) 98.4 F (36.9 C) (Oral)  Resp 18  SpO2 98% Physical Exam  Nursing note and vitals reviewed. Constitutional: He is oriented to person, place, and time. He appears well-developed and well-nourished. No distress.  HENT:  Head: Normocephalic and atraumatic.  Mouth/Throat: Oropharynx is clear and moist.  Cardiovascular: Normal rate, regular rhythm and normal heart sounds.   No murmur heard. Pulmonary/Chest: Effort normal and breath sounds normal. No respiratory distress. He has no wheezes.  Abdominal: Soft. Bowel sounds are normal. There is no tenderness. There is no rebound.  Genitourinary: Rectum normal.  Normal rectal tone, no obvious hemorrhoids, no gross blood  Musculoskeletal: He exhibits no edema.  Neurological: He is alert and oriented to person, place, and time.  Skin: Skin is warm and dry.  Psychiatric: He has a normal mood and affect.    ED Course  Procedures (including critical care time) Labs Review Labs Reviewed  CBC - Abnormal; Notable for the following:    WBC 11.0 (*)    All other components within  normal limits  COMPREHENSIVE METABOLIC PANEL  POC OCCULT BLOOD, ED  TYPE AND SCREEN  ABO/RH    Imaging Review No results found.   EKG Interpretation None      MDM   Final diagnoses:  Blood in stool    Patient presents with reports of bloody stools. No associated symptoms. No history of prior. Vital signs are stable. No evidence of orthostasis.  He is grossly negative from below as well as Hemoccult negative. CBC shows a hemoglobin of 15.7 and  a normal CMP. Patient has no objective evidence of active bleeding at this time. He was able to tolerate fluids.  Discussed with patient strict return precautions. Will refer to GI if patient continues to have symptoms. Patient stated understanding. Do not patient needs further imaging or workup at this time.  After history, exam, and medical workup I feel the patient has been appropriately medically screened and is safe for discharge home. Pertinent diagnoses were discussed with the patient. Patient was given return precautions.    Shon Baton, MD 12/27/13 [phone]

## 2013-12-27 NOTE — Discharge Instructions (Signed)
You were evaluated today for bloody stools. There is no evidence of active bleeding and there is no evidence of blood in your stools in the emergency room. He continued to experience symptoms, you should call the gastroenterologist. See return precautions below.  Bloody Stools Bloody stools means there is blood in your poop (stool). It is a sign that there is a problem somewhere in the digestive system. It is important for your doctor to find the cause of your bleeding, so the problem can be treated.  HOME CARE  Only take medicine as told by your doctor.  Eat foods with fiber (prunes, bran cereals).  Drink enough fluids to keep your pee (urine) clear or pale yellow.  Sit in warm water (sitz bath) for 10 to 15 minutes as told by your doctor.  Know how to take your medicines (enemas, suppositories) if advised by your doctor.  Watch for signs that you are getting better or getting worse. GET HELP RIGHT AWAY IF:   You are not getting better.  You start to get better but then get worse again.  You have new problems.  You have severe bleeding from the place where poop comes out (rectum) that does not stop.  You throw up (vomit) blood.  You feel weak or pass out (faint).  You have a fever. MAKE SURE YOU:   Understand these instructions.  Will watch your condition.  Will get help right away if you are not doing well or get worse. Document Released: 03/11/2009 Document Revised: 06/15/2011 Document Reviewed: 08/08/2010 [facility] Patient Information 2015 [place], [place]. This information is not intended to replace advice given to you by your health care provider. Make sure you discuss any questions you have with your health care provider.

## 2014-02-05 ENCOUNTER — Emergency Department (HOSPITAL_COMMUNITY)
Admission: EM | Admit: 2014-02-05 | Discharge: 2014-02-05 | Disposition: A | Discharge status: Home / Self Care | Payer: Medicaid Other | Attending: Emergency Medicine | Admitting: Emergency Medicine

## 2014-02-05 ENCOUNTER — Encounter (HOSPITAL_COMMUNITY): Payer: Self-pay | Admitting: Emergency Medicine

## 2014-02-05 DIAGNOSIS — Z79899 Other long term (current) drug therapy: Secondary | ICD-10-CM | POA: Insufficient documentation

## 2014-02-05 DIAGNOSIS — G40909 Epilepsy, unspecified, not intractable, without status epilepticus: Secondary | ICD-10-CM | POA: Insufficient documentation

## 2014-02-05 DIAGNOSIS — Z720 Tobacco use: Secondary | ICD-10-CM | POA: Insufficient documentation

## 2014-02-05 DIAGNOSIS — M546 Pain in thoracic spine: Secondary | ICD-10-CM | POA: Insufficient documentation

## 2014-02-05 DIAGNOSIS — G8929 Other chronic pain: Secondary | ICD-10-CM | POA: Insufficient documentation

## 2014-02-05 MED ORDER — TRAMADOL HCL 50 MG PO TABS: 50.0000 mg | ORAL_TABLET | Freq: Four times a day (QID) | ORAL | Status: DC | PRN

## 2014-02-05 MED ORDER — METHOCARBAMOL 500 MG PO TABS: 500.0000 mg | ORAL_TABLET | Freq: Two times a day (BID) | ORAL | Status: DC

## 2014-02-05 MED ORDER — OXYCODONE-ACETAMINOPHEN 5-325 MG PO TABS
2.0000 | ORAL_TABLET | Freq: Once | ORAL | Status: AC
  Administered 2014-02-05: 2 via ORAL
  Filled 2014-02-05: qty 2

## 2014-02-05 NOTE — Discharge Instructions (Signed)
Take the prescribed medication as directed.  May wish to apply heat to affected areas to help with pain/soreness. °Follow-up with your primary care physician. °Return to the ED for new or worsening symptoms. ° °

## 2014-02-05 NOTE — ED Provider Notes (Signed)
CSN: [id number]     Arrival date & time 02/05/14  1026 History   First MD Initiated Contact with Patient 02/05/14 1104     Chief Complaint  Patient presents with  . Back Pain     (Consider location/radiation/quality/duration/timing/severity/associated sxs/prior Treatment) Patient is a 33 y.o. male presenting with back pain. The history is provided by the patient and medical records.  Back Pain    This is a 33 year old male with past medical history significant for seizure disorder and chronic back pain, presenting to the ED for back pain. 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 States coworker dropped his end and he dropped the box.  States he feels like he pulled something in his back.  States pain mostly localized to just beneath his left shoulder blade.  Pain worse with movement and deep breathing.  Denies chest pain or shortness of breath.  No numbness, paresthesias or weakness of extremities.  No loss of bowel or bladder control.  Has taken multiple OTC meds without noted improvement.    Past Medical History  Diagnosis Date  . Back pain, chronic   . Seizures    Past Surgical History  Procedure Laterality Date  . Cholecystectomy     Family History  Problem Relation Age of Onset  . Cancer Father   . Diabetes Other   . Coronary artery disease Other    History  Substance Use Topics  . Smoking status: Current Every Day Smoker -- 0.50 packs/day    Types: Cigarettes  . Smokeless tobacco: Not on file  . Alcohol Use: Yes     Comment: social    Review of Systems  Musculoskeletal: Positive for back pain.  All other systems reviewed and are negative.     Allergies  Naproxen  Home Medications   Prior to Admission medications   Medication Sig Start Date End Date Taking? Authorizing Provider  levETIRAcetam (KEPPRA) 500 MG tablet Take 1,500 mg by mouth 2 (two) times daily.     Historical Provider, MD  phenytoin  (DILANTIN) 200 MG ER capsule Take 400 mg by mouth 2 (two) times daily.    Historical Provider, MD  traMADol (ULTRAM) 50 MG tablet Take 1 tablet (50 mg total) by mouth every 6 (six) hours as needed. 12/21/13   Garlon HatchetLisa M Kiarra Kidd, PA-C   BP 110/61 mmHg  Pulse 82  Temp(Src) 97.8 F (36.6 C) (Oral)  Resp 19  SpO2 100%   Physical Exam  Constitutional: He is oriented to person, place, and time. He appears well-developed and well-nourished. No distress.  HENT:  Head: Normocephalic and atraumatic.  Mouth/Throat: Oropharynx is clear and moist.  Eyes: Conjunctivae and EOM are normal. Pupils are equal, round, and reactive to light.  Neck: Normal range of motion. Neck supple.  Cardiovascular: Normal rate, regular rhythm and normal heart sounds.   Pulmonary/Chest: Effort normal and breath sounds normal. No respiratory distress. He has no wheezes.  Musculoskeletal: Normal range of motion. He exhibits no edema.       Thoracic back: He exhibits tenderness and pain.       Back:  Left thoracic paraspinal tenderness; no midline tenderness, deformities, or step-off; full ROM maintained; normal strength and sensation of all 4 extremities  Neurological: He is alert and oriented to person, place, and time.  Skin: Skin is warm and dry. He is not diaphoretic.  Psychiatric: He has a normal mood and affect.  Nursing  note and vitals reviewed.   ED Course  Procedures (including critical care time) Labs Review Labs Reviewed - No data to display  Imaging Review No results found.   EKG Interpretation None      MDM   Final diagnoses:  Left-sided thoracic back pain   Left thoracic paraspinal pain after heavy lifting.  No red flag sx or neurologic deficits on exam.  Suspect muscular strain.  Patient will be discharged with pain medication and muscle relaxant.  Will FU with PCP.  Discussed plan with patient, he/she acknowledged understanding and agreed with plan of care.  Return precautions given for new or  worsening symptoms.  Garlon HatchetLisa M Gresham Caetano, PA-C 02/05/14 1158

## 2014-02-05 NOTE — ED Notes (Signed)
Pt was at work last night when they were carrying a large heavy object when his coworker dropped his end all the weight came onto patient. Pt c/o back pain that is right under his shoulders and states it hurts when he takes deep breaths.

## 2014-02-12 ENCOUNTER — Emergency Department (HOSPITAL_COMMUNITY): Payer: Medicaid Other

## 2014-02-12 ENCOUNTER — Emergency Department (HOSPITAL_COMMUNITY)
Admission: EM | Admit: 2014-02-12 | Discharge: 2014-02-12 | Disposition: A | Discharge status: Home / Self Care | Payer: Medicaid Other | Attending: Emergency Medicine | Admitting: Emergency Medicine

## 2014-02-12 ENCOUNTER — Encounter (HOSPITAL_COMMUNITY): Payer: Self-pay | Admitting: Emergency Medicine

## 2014-02-12 DIAGNOSIS — Z79899 Other long term (current) drug therapy: Secondary | ICD-10-CM | POA: Insufficient documentation

## 2014-02-12 DIAGNOSIS — G8929 Other chronic pain: Secondary | ICD-10-CM | POA: Insufficient documentation

## 2014-02-12 DIAGNOSIS — J209 Acute bronchitis, unspecified: Secondary | ICD-10-CM | POA: Insufficient documentation

## 2014-02-12 DIAGNOSIS — G40909 Epilepsy, unspecified, not intractable, without status epilepticus: Secondary | ICD-10-CM | POA: Insufficient documentation

## 2014-02-12 DIAGNOSIS — R079 Chest pain, unspecified: Secondary | ICD-10-CM | POA: Insufficient documentation

## 2014-02-12 DIAGNOSIS — Z720 Tobacco use: Secondary | ICD-10-CM | POA: Insufficient documentation

## 2014-02-12 DIAGNOSIS — R062 Wheezing: Secondary | ICD-10-CM | POA: Insufficient documentation

## 2014-02-12 MED ORDER — PREDNISONE 10 MG PO TABS: 40.0000 mg | ORAL_TABLET | Freq: Every day | ORAL | Status: DC

## 2014-02-12 MED ORDER — ALBUTEROL SULFATE HFA 108 (90 BASE) MCG/ACT IN AERS
2.0000 | INHALATION_SPRAY | Freq: Once | RESPIRATORY_TRACT | Status: AC
  Administered 2014-02-12: 2 via RESPIRATORY_TRACT
  Filled 2014-02-12: qty 6.7

## 2014-02-12 NOTE — ED Provider Notes (Signed)
CSN: [id number]     Arrival date & time 02/12/14  [id number] History   First MD Initiated Contact with Patient 02/12/14 [id number]     Chief Complaint  Patient presents with  . Cough  . Chest Pain     HPI Comments: Mr. Brandon Blake presents with 2 weeks of cough and chest pain. He reports 2 weeks of cough productive of yellow and green sputum with associated shortness of breath. He has some associated chest pressure that comes and goes with the cough. Chest pressure is in the central chest is nonradiating and waxing and waning. Symptoms are moderate, intermittent, and worsening.  He had a fever to 102 one week ago, fevers have since resolved. No family history of coronary artery disease. Patient has no history of DVT, PE, or her artery disease.  Patient is a 33 y.o. male presenting with cough and chest pain. The history is provided by the patient.  Cough Cough characteristics:  Productive Sputum characteristics:  Yellow Severity:  Moderate Onset quality:  Gradual Duration:  2 weeks Timing:  Intermittent Progression:  Worsening Chronicity:  New Smoker: yes   Relieved by:  Nothing Associated symptoms: chest pain   Chest Pain Associated symptoms: cough     Past Medical History  Diagnosis Date  . Back pain, chronic   . Seizures    Past Surgical History  Procedure Laterality Date  . Cholecystectomy     Family History  Problem Relation Age of Onset  . Cancer Father   . Diabetes Other   . Coronary artery disease Other    History  Substance Use Topics  . Smoking status: Current Every Day Smoker -- 0.50 packs/day    Types: Cigarettes  . Smokeless tobacco: Not on file  . Alcohol Use: Yes     Comment: social    Review of Systems  Respiratory: Positive for cough.   Cardiovascular: Positive for chest pain. Negative for leg swelling.  All other systems reviewed and are negative.     Allergies  Naproxen  Home Medications   Prior to Admission medications   Medication Sig Start Date  End Date Taking? Authorizing Provider  levETIRAcetam (KEPPRA) 500 MG tablet Take 1,500 mg by mouth 2 (two) times daily.     Historical Provider, MD  methocarbamol (ROBAXIN) 500 MG tablet Take 1 tablet (500 mg total) by mouth 2 (two) times daily. 02/05/14   Garlon HatchetLisa M Sanders, PA-C  phenytoin (DILANTIN) 200 MG ER capsule Take 400 mg by mouth 2 (two) times daily.    Historical Provider, MD  traMADol (ULTRAM) 50 MG tablet Take 1 tablet (50 mg total) by mouth every 6 (six) hours as needed. 02/05/14   Garlon HatchetLisa M Sanders, PA-C   BP 123/71 mmHg  Pulse 75  Resp 18  SpO2 100% Physical Exam  Constitutional: He is oriented to person, place, and time. He appears well-developed and well-nourished.  Mild distress  HENT:  Head: Normocephalic and atraumatic.  Neck: Neck supple.  Cardiovascular: Normal rate, regular rhythm and normal heart sounds.   No murmur heard. Pulmonary/Chest: Effort normal. No respiratory distress.  Occasional end expiratory wheeze in the left upper lobe.  Abdominal: Soft.  Musculoskeletal: He exhibits no edema or tenderness.  Neurological: He is alert and oriented to person, place, and time.  Skin: Skin is warm and dry.  Psychiatric: He has a normal mood and affect. His behavior is normal.  Nursing note and vitals reviewed.   ED Course  Procedures (including critical care time) Labs  Review Labs Reviewed - No data to display  Imaging Review Dg Chest 2 View  02/12/2014   CLINICAL DATA:  Cough, congestion, shortness of breath  EXAM: CHEST  2 VIEW  COMPARISON:  07/02/2013  FINDINGS: The heart size and mediastinal contours are within normal limits. Both lungs are clear. The visualized skeletal structures are unremarkable.  IMPRESSION: No active cardiopulmonary disease.   Electronically Signed   By: Elige KoHetal  Patel   On: 02/12/2014 08:24  Chest x-ray reviewed by myself. Agree with radiologist interpretation.   EKG Interpretation   Date/Time:  Monday February 12 2014 07:31:22  EST Ventricular Rate:  73 PR Interval:  146 QRS Duration: 100 QT Interval:  401 QTC Calculation: 442 R Axis:   36 Text Interpretation:  Sinus rhythm No significant change since last  tracing   Confirmed by Lincoln Brighamees, Liz [phone]([id number]) on 02/12/2014 7:56:58 AM      Patient reports feeling improved after metered dose inhaler.  MDM   Final diagnoses:  Acute bronchitis, antibiotics not indicated   Patient presents for evaluation of chest pain and cough. Clinical picture not consistent with PE patient is perk negative. Clinical picture not consistent with  ACS, heart score of 0. Patient with bronchitis, do not feel antibiotics indicated at this time. Discussed with patient importance of smoking cessation, home care for bronchitis, as well as return precautions.    Tilden FossaElizabeth Toleen Lachapelle, MD 02/12/14 [phone]

## 2014-02-12 NOTE — ED Notes (Signed)
Pt c/o cough and chest pain x 2 weeks, states he has been coughing up dark yellow/green sputum. Pt states sx started at the same time.

## 2014-02-12 NOTE — Discharge Instructions (Signed)

## 2014-02-28 ENCOUNTER — Emergency Department (HOSPITAL_COMMUNITY)
Admission: EM | Admit: 2014-02-28 | Discharge: 2014-02-28 | Disposition: A | Discharge status: Home / Self Care | Payer: Medicaid Other | Attending: Emergency Medicine | Admitting: Emergency Medicine

## 2014-02-28 ENCOUNTER — Emergency Department (HOSPITAL_COMMUNITY): Payer: Medicaid Other

## 2014-02-28 ENCOUNTER — Encounter (HOSPITAL_COMMUNITY): Payer: Self-pay | Admitting: Emergency Medicine

## 2014-02-28 DIAGNOSIS — Z7952 Long term (current) use of systemic steroids: Secondary | ICD-10-CM | POA: Insufficient documentation

## 2014-02-28 DIAGNOSIS — Z720 Tobacco use: Secondary | ICD-10-CM | POA: Insufficient documentation

## 2014-02-28 DIAGNOSIS — L02412 Cutaneous abscess of left axilla: Secondary | ICD-10-CM | POA: Insufficient documentation

## 2014-02-28 DIAGNOSIS — L03112 Cellulitis of left axilla: Secondary | ICD-10-CM | POA: Insufficient documentation

## 2014-02-28 DIAGNOSIS — Z79899 Other long term (current) drug therapy: Secondary | ICD-10-CM | POA: Insufficient documentation

## 2014-02-28 DIAGNOSIS — G8929 Other chronic pain: Secondary | ICD-10-CM | POA: Insufficient documentation

## 2014-02-28 DIAGNOSIS — L03114 Cellulitis of left upper limb: Secondary | ICD-10-CM

## 2014-02-28 DIAGNOSIS — L0291 Cutaneous abscess, unspecified: Secondary | ICD-10-CM

## 2014-02-28 LAB — CBC WITH DIFFERENTIAL/PLATELET
Basophils Absolute: 0 10*3/uL (ref 0.0–0.1)
Basophils Relative: 0 % (ref 0–1)
EOS PCT: 2 % (ref 0–5)
Eosinophils Absolute: 0.2 10*3/uL (ref 0.0–0.7)
HCT: 39.7 % (ref 39.0–52.0)
Hemoglobin: 13.9 g/dL (ref 13.0–17.0)
LYMPHS PCT: 32 % (ref 12–46)
Lymphs Abs: 3 10*3/uL (ref 0.7–4.0)
MCH: 31.7 pg (ref 26.0–34.0)
MCHC: 35 g/dL (ref 30.0–36.0)
MCV: 90.6 fL (ref 78.0–100.0)
Monocytes Absolute: 0.8 10*3/uL (ref 0.1–1.0)
Monocytes Relative: 9 % (ref 3–12)
NEUTROS PCT: 57 % (ref 43–77)
Neutro Abs: 5.3 10*3/uL (ref 1.7–7.7)
PLATELETS: 209 10*3/uL (ref 150–400)
RBC: 4.38 MIL/uL (ref 4.22–5.81)
RDW: 12.2 % (ref 11.5–15.5)
WBC: 9.4 10*3/uL (ref 4.0–10.5)

## 2014-02-28 LAB — BASIC METABOLIC PANEL
ANION GAP: 13 (ref 5–15)
BUN: 11 mg/dL (ref 6–23)
CO2: 26 meq/L (ref 19–32)
Calcium: 9.4 mg/dL (ref 8.4–10.5)
Chloride: 100 mEq/L (ref 96–112)
Creatinine, Ser: 0.71 mg/dL (ref 0.50–1.35)
GFR calc Af Amer: >90 mL/min (ref >90)
Glucose, Bld: 91 mg/dL (ref 70–99)
Potassium: 4 mEq/L (ref 3.7–5.3)
SODIUM: 139 meq/L (ref 137–147)

## 2014-02-28 MED ORDER — SULFAMETHOXAZOLE-TRIMETHOPRIM 800-160 MG PO TABS: 2.0000 | ORAL_TABLET | Freq: Two times a day (BID) | ORAL | Status: DC

## 2014-02-28 MED ORDER — LIDOCAINE-EPINEPHRINE (PF) 2 %-1:200000 IJ SOLN
10.0000 mL | Freq: Once | INTRAMUSCULAR | Status: DC
  Filled 2014-02-28: qty 20

## 2014-02-28 MED ORDER — HYDROCODONE-ACETAMINOPHEN 5-325 MG PO TABS: 2.0000 | ORAL_TABLET | ORAL | Status: DC | PRN

## 2014-02-28 MED ORDER — CLINDAMYCIN PHOSPHATE 600 MG/50ML IV SOLN
600.0000 mg | Freq: Once | INTRAVENOUS | Status: AC
  Administered 2014-02-28: 600 mg via INTRAVENOUS
  Filled 2014-02-28: qty 50

## 2014-02-28 MED ORDER — CEPHALEXIN 500 MG PO CAPS: 500.0000 mg | ORAL_CAPSULE | Freq: Four times a day (QID) | ORAL | Status: DC

## 2014-02-28 MED ORDER — OXYCODONE-ACETAMINOPHEN 5-325 MG PO TABS
2.0000 | ORAL_TABLET | Freq: Once | ORAL | Status: AC
  Administered 2014-02-28: 2 via ORAL
  Filled 2014-02-28: qty 2

## 2014-02-28 NOTE — ED Provider Notes (Signed)
CSN: [id number]     Arrival date & time 02/28/14  1738 History   First MD Initiated Contact with Patient 02/28/14 1847     Chief Complaint  Patient presents with  . Abscess     (Consider location/radiation/quality/duration/timing/severity/associated sxs/prior Treatment) HPI Comments: Patient presents today with an abscess to the dorsal aspect of his left wrist.  He reports that two days ago he noticed an insect on his wrist.  He then flicked it off.  He did not actually feel the insect bite him.  Later he noticed an abscess of the dorsal aspect of the wrist.  He reports that today there appeared to be increasing redness around the area, which prompted him to come to the ED.  He reports that he has been applying Peroxide and antibiotic ointment to the area without improvement.  He reports that the area is very tender to touch.  He has been taking Goody Powder for the pain without improvement.  He denies fever, chills, nausea, vomiting, numbness, or tingling.  He reports that he has not noticed any drainage from the area.  He does have a history of previous abscesses, but no history of HIV or DM.  The history is provided by the patient.    Past Medical History  Diagnosis Date  . Back pain, chronic   . Seizures    Past Surgical History  Procedure Laterality Date  . Cholecystectomy     Family History  Problem Relation Age of Onset  . Cancer Father   . Diabetes Other   . Coronary artery disease Other    History  Substance Use Topics  . Smoking status: Current Every Day Smoker -- 0.50 packs/day    Types: Cigarettes  . Smokeless tobacco: Not on file  . Alcohol Use: Yes     Comment: social    Review of Systems  All other systems reviewed and are negative.     Allergies  Naproxen  Home Medications   Prior to Admission medications   Medication Sig Start Date End Date Taking? Authorizing Provider  Aspirin-Acetaminophen (GOODYS BODY PAIN PO) Take 2 tablets by mouth once.    Yes Historical Provider, MD  methocarbamol (ROBAXIN) 500 MG tablet Take 1 tablet (500 mg total) by mouth 2 (two) times daily. Patient not taking: Reported on 02/28/2014 02/05/14   Garlon HatchetLisa M Sanders, PA-C  predniSONE (DELTASONE) 10 MG tablet Take 4 tablets (40 mg total) by mouth daily. Patient not taking: Reported on 02/28/2014 02/12/14   Tilden FossaElizabeth Rees, MD  traMADol (ULTRAM) 50 MG tablet Take 1 tablet (50 mg total) by mouth every 6 (six) hours as needed. Patient not taking: Reported on 02/28/2014 02/05/14   Garlon HatchetLisa M Sanders, PA-C   BP 125/79 mmHg  Pulse 88  Temp(Src) 97.8 F (36.6 C) (Oral)  Resp 18  SpO2 100% Physical Exam  Constitutional: He appears well-developed and well-nourished.  HENT:  Head: Normocephalic and atraumatic.  Mouth/Throat: Oropharynx is clear and moist.  Neck: Normal range of motion. Neck supple.  Cardiovascular: Normal rate, regular rhythm and normal heart sounds.   Pulses:      Radial pulses are 2+ on the right side, and 2+ on the left side.  Pulmonary/Chest: Effort normal and breath sounds normal.  Musculoskeletal: Normal range of motion.       Left elbow: He exhibits normal range of motion. No tenderness found.       Left wrist: He exhibits normal range of motion, no swelling and no crepitus.  Pain with ROM of the left wrist  Neurological: He is alert.  Distal sensation of the left hand intact  Skin: Skin is warm and dry.     Nursing note and vitals reviewed.   ED Course  Procedures (including critical care time) Labs Review Labs Reviewed  CBC WITH DIFFERENTIAL  BASIC METABOLIC PANEL    Imaging Review No results found.   EKG Interpretation None     INCISION AND DRAINAGE Performed by: Santiago GladLaisure, Lucciana Head Consent: Verbal consent obtained. Risks and benefits: risks, benefits and alternatives were discussed Type: abscess  Body area: wrist  Anesthesia: local infiltration  Incision was made with a scalpel.  Local anesthetic: lidocaine 2% with  epinephrine  Anesthetic total: 2 ml  Complexity: complex Blunt dissection to break up loculations  Drainage: purulent  Drainage amount: moderate  Patient tolerance: Patient tolerated the procedure well with no immediate complications.    MDM   Final diagnoses:  Abscess   Patient presenting with an abscess of his left wrist.  Area with surrounding Cellulitis.  Abscess incised and drained in the ED with good results.  Patient is afebrile.  Neurovascularly intact.  No nausea or vomiting.  Labs unremarkable.  Patient given IV Clindamycin in the ED and discharged home with antibiotics.  Feel that the patient is stable for discharge and does not need inpatient treatment at this time.  Patient instructed to follow up in 48 hours for recheck.  Return precautions given.      Santiago GladHeather Loukisha Gunnerson, PA-C 03/02/14 2222  Audree CamelScott T Goldston, MD 03/03/14 [phone]

## 2014-02-28 NOTE — ED Notes (Signed)
Pt states that he noticed a bug on his arm 2 days ago.  States that he thinks it bit him.  Now has an abscess to lt outer wrist.  States that he now has a streak moving up his arm.

## 2014-03-03 ENCOUNTER — Emergency Department (HOSPITAL_COMMUNITY)
Admission: EM | Admit: 2014-03-03 | Discharge: 2014-03-03 | Disposition: A | Discharge status: Home / Self Care | Payer: Medicaid Other | Attending: Emergency Medicine | Admitting: Emergency Medicine

## 2014-03-03 ENCOUNTER — Encounter (HOSPITAL_COMMUNITY): Payer: Self-pay | Admitting: *Deleted

## 2014-03-03 DIAGNOSIS — Z792 Long term (current) use of antibiotics: Secondary | ICD-10-CM | POA: Insufficient documentation

## 2014-03-03 DIAGNOSIS — M549 Dorsalgia, unspecified: Secondary | ICD-10-CM

## 2014-03-03 DIAGNOSIS — G8929 Other chronic pain: Secondary | ICD-10-CM | POA: Insufficient documentation

## 2014-03-03 DIAGNOSIS — Z720 Tobacco use: Secondary | ICD-10-CM | POA: Insufficient documentation

## 2014-03-03 DIAGNOSIS — Z7952 Long term (current) use of systemic steroids: Secondary | ICD-10-CM | POA: Insufficient documentation

## 2014-03-03 DIAGNOSIS — Z79899 Other long term (current) drug therapy: Secondary | ICD-10-CM | POA: Insufficient documentation

## 2014-03-03 DIAGNOSIS — M545 Low back pain: Secondary | ICD-10-CM | POA: Insufficient documentation

## 2014-03-03 DIAGNOSIS — Z8669 Personal history of other diseases of the nervous system and sense organs: Secondary | ICD-10-CM | POA: Insufficient documentation

## 2014-03-03 DIAGNOSIS — L02414 Cutaneous abscess of left upper limb: Secondary | ICD-10-CM | POA: Insufficient documentation

## 2014-03-03 MED ORDER — HYDROMORPHONE HCL 2 MG/ML IJ SOLN: 2.0000 mg | Freq: Once | INTRAMUSCULAR | Status: DC

## 2014-03-03 NOTE — ED Notes (Signed)
Patient refused to have vital signs taken.

## 2014-03-03 NOTE — ED Notes (Signed)
Pt reports right lower back pain, sts he is unable to walk due to pain, pain is radiating to his hip. He sts pain started after he was bending down working on his carpet

## 2014-03-03 NOTE — ED Notes (Signed)
Bed: WTR6 Expected date:  Expected time:  Means of arrival:  Comments: 

## 2014-03-03 NOTE — ED Provider Notes (Addendum)
CSN: [id number]     Arrival date & time 03/03/14  0740 History   First MD Initiated Contact with Patient 03/03/14 0805     Chief Complaint  Patient presents with  . Back Pain     (Consider location/radiation/quality/duration/timing/severity/associated sxs/prior Treatment) HPI Complains of right-sided lumbar back pain onset 7 days ago when he pulled up his staple from his carpet. Pain is worse with movement improved with remaining still. He's been taking hydrocodone for the pain without adequate pain relief. Patient was seen here for left wrist abscess on 02/28/2014 prescribed Norco and Keflex. He denies fever no loss of bladder or bowel control no other associated symptoms Past Medical History  Diagnosis Date  . Back pain, chronic   . Seizures    Past Surgical History  Procedure Laterality Date  . Cholecystectomy     Family History  Problem Relation Age of Onset  . Cancer Father   . Diabetes Other   . Coronary artery disease Other    History  Substance Use Topics  . Smoking status: Current Every Day Smoker -- 0.50 packs/day    Types: Cigarettes  . Smokeless tobacco: Not on file  . Alcohol Use: Yes     Comment: social    Review of Systems  Musculoskeletal: Positive for back pain.  Skin: Positive for wound.       Abscess left wrist which patient states is improving with time  All other systems reviewed and are negative.     Allergies  Naproxen  Home Medications   Prior to Admission medications   Medication Sig Start Date End Date Taking? Authorizing Provider  Aspirin-Acetaminophen (GOODYS BODY PAIN PO) Take 2 tablets by mouth once.    Historical Provider, MD  cephALEXin (KEFLEX) 500 MG capsule Take 1 capsule (500 mg total) by mouth 4 (four) times daily. 02/28/14   [name], PA-C  HYDROcodone-acetaminophen (NORCO/VICODIN) 5-325 MG per tablet Take 2 tablets by mouth every 4 (four) hours as needed. 02/28/14   Heather Laisure, PA-C  methocarbamol (ROBAXIN) 500  MG tablet Take 1 tablet (500 mg total) by mouth 2 (two) times daily. Patient not taking: Reported on 02/28/2014 02/05/14   Garlon HatchetLisa M Sanders, PA-C  predniSONE (DELTASONE) 10 MG tablet Take 4 tablets (40 mg total) by mouth daily. Patient not taking: Reported on 02/28/2014 02/12/14   Tilden FossaElizabeth Rees, MD  sulfamethoxazole-trimethoprim [facility](SEPTRA DS) 800-160 MG per tablet Take 2 tablets by mouth 2 (two) times daily. 02/28/14   Heather Laisure, PA-C  traMADol (ULTRAM) 50 MG tablet Take 1 tablet (50 mg total) by mouth every 6 (six) hours as needed. Patient not taking: Reported on 02/28/2014 02/05/14   Garlon HatchetLisa M Sanders, PA-C   BP 99/73 mmHg  Pulse 70  Temp(Src) 98 F (36.7 C) (Oral)  Resp 20  SpO2 98% Physical Exam  Constitutional: He is oriented to person, place, and time. He appears well-developed and well-nourished. He appears distressed.  Appears uncomfortable Glasgow Coma Score 15  HENT:  Head: Normocephalic and atraumatic.  Eyes: Conjunctivae are normal. Pupils are equal, round, and reactive to light.  Neck: Neck supple. No tracheal deviation present. No thyromegaly present.  Cardiovascular: Normal rate and regular rhythm.   No murmur heard. Pulmonary/Chest: Effort normal and breath sounds normal.  Abdominal: Soft. Bowel sounds are normal. He exhibits no distension. There is no tenderness.  Musculoskeletal: Normal range of motion. He exhibits no edema or tenderness.  Entire spine nontender. He has pain at right paralumbar area when standing up  from a seated position date is normal  Neurological: He is alert and oriented to person, place, and time. He has normal reflexes. No cranial nerve deficit. Coordination normal.  DTR symmetric bilaterally at knee jerk ankle jerk and biceps toes or going bilaterally  Skin: Skin is warm and dry. No rash noted.  Left upper extremity with reddened area approximate 5 cm without swelling, minimally tender. Neurovascular intact  Psychiatric: He has a normal mood and  affect.  Nursing note and vitals reviewed.   ED Course  Procedures (including critical care time) Labs Review Labs Reviewed - No data to display  Imaging Review No results found.   EKG Interpretation None     Patient did not receive hydromorphone ordered. He refused further vital signs. He was highly insulting to me and the staff MDM  Pt refused further vitals and is leaving with family member. No rx written Dx chronic back pain  Final diagnoses:  None        Doug SouSam Pleasant Britz, MD 03/03/14 [phone]  Doug SouSam Liseth Wann, MD 03/03/14 [phone]

## 2014-03-03 NOTE — Discharge Instructions (Signed)
Chronic Back Pain Call any of the numbers on the resource guide to get a primary care physician. You can take the hydrocodone prescribed to you earlier this week for pain  When back pain lasts longer than 3 months, it is called chronic back pain.People with chronic back pain often go through certain periods that are more intense (flare-ups).  CAUSES Chronic back pain can be caused by wear and tear (degeneration) on different structures in your back. These structures include:  The bones of your spine (vertebrae) and the joints surrounding your spinal cord and nerve roots (facets).  The strong, fibrous tissues that connect your vertebrae (ligaments). Degeneration of these structures may result in pressure on your nerves. This can lead to constant pain. HOME CARE INSTRUCTIONS  Avoid bending, heavy lifting, prolonged sitting, and activities which make the problem worse.  Take brief periods of rest throughout the day to reduce your pain. Lying down or standing usually is better than sitting while you are resting.  Take over-the-counter or prescription medicines only as directed by your caregiver. SEEK IMMEDIATE MEDICAL CARE IF:   You have weakness or numbness in one of your legs or feet.  You have trouble controlling your bladder or bowels.  You have nausea, vomiting, abdominal pain, shortness of breath, or fainting. Document Released: 04/30/2004 Document Revised: 06/15/2011 Document Reviewed: 03/07/2011 [facility] Patient Information 2015 [place], [place]. This information is not intended to replace advice given to you by your health care provider. Make sure you discuss any questions you have with your health care provider.  Emergency Department Resource Guide 1) Find a Doctor and Pay Out of Pocket Although you won't have to find out who is covered by your insurance plan, it is a good idea to ask around and get recommendations. You will then need to call the office and see if the doctor you  have chosen will accept you as a new patient and what types of options they offer for patients who are self-pay. Some doctors offer discounts or will set up payment plans for their patients who do not have insurance, but you will need to ask so you aren't surprised when you get to your appointment.  2) Contact Your Local Health Department Not all health departments have doctors that can see patients for sick visits, but many do, so it is worth a call to see if yours does. If you don't know where your local health department is, you can check in your phone book. The CDC also has a tool to help you locate your state's health department, and many state websites also have listings of all of their local health departments.  3) Find a Walk-in Clinic If your illness is not likely to be very severe or complicated, you may want to try a walk in clinic. These are popping up all over the country in pharmacies, drugstores, and shopping centers. They're usually staffed by nurse practitioners or physician assistants that have been trained to treat common illnesses and complaints. They're usually fairly quick and inexpensive. However, if you have serious medical issues or chronic medical problems, these are probably not your best option.  No Primary Care Doctor: - Call Health Connect at  [phone] - they can help you locate a primary care doctor that  accepts your insurance, provides certain services, etc. - Physician Referral Service- [phone]  Chronic Pain Problems: Organization         Address  Phone   Notes  [facility]  (  [phone] Patients need to be referred by their primary care doctor.   Medication Assistance: Organization         Address  Phone   Notes  [facility] [address] [place], [place] [address] [phone] --Must be a resident of [place] -- Must have NO insurance coverage whatsoever (no Medicaid/ Medicare,  etc.) -- The pt. MUST have a primary care doctor that directs their care regularly and follows them in the community   MedAssist  [phone]) [phone]   [employer]  [phone]    Agencies that provide inexpensive medical care: Organization         Address  Phone   Notes  [facility]  [phone]) [phone]   [facility]    [phone]   [facility] [address] [place], [place] [address] [phone]) [phone]   [facility] [place] [address], [place] [phone]) [phone]   Planned Parenthood    [phone](336) [phone]   [facility]    [phone]   Community Health and [facility]  [address], [place] Phone:  [phone](336) [phone], Fax:  [phone] Hours of Operation:  9 am - 6 pm, M-F.  Also accepts Medicaid/Medicare and self-pay.  [facility]  [address], [place] Phone: [phone], Fax: [phone]) [phone]. Hours of Operation:  8:30 am - 5:30 pm, M-F.  Also accepts Medicaid and self-pay.  [facility] [place] [address], [place] Phone: [phone]) [phone]   [facility] [address], [place], [place] [phone], Ext. [phone] Mondays & Thursdays: 7-9 AM.  First 15 patients are seen on a first come, first serve basis.    Medicaid-accepting [facility] Providers:  Organization         Address  Phone   Notes  [facility] [address], [place] [phone] Also accepts self-pay patients.  [facility] [address], [place]  [phone](336) [phone]   [facility] [address], [place] [phone]) [phone]   [facility] [address] [place] [address], [place] [phone]) [phone]   Renaye RakersVeita Bland [address], [place]   [phone] Only accepts [place] Access [place] patients after they have their name applied to their card.   Self-Pay (no  insurance) in [facility]:  Organization         Address  Phone   Notes  Sickle Cell Patients, [facility] [address], [place] [phone]) [phone]   [facility] [address], [place] [phone]) [phone]   [facility] [place]  [address], [place] [phone]) [phone]   [facility]/Dr. Osei-Bonsu  [address] [place] [address], [place] or [phone], [place] [phone]) [phone] Phone number for both [place] and [place] locations is the same.  [facility] and [facility] [address], [place] [phone]) [phone]   [facility] [address] [place] [address], [place] or [address] [phone]) [phone]   [facility] [address], [place] [phone]) [phone], phone; [phone]) [phone], fax Sees patients 1st and 3rd Saturday of every month.  Must not qualify for public or private insurance (i.e. Medicaid, Medicare, [place] Health Choice, Veterans' Benefits)  Household income should be no more than 200% of the poverty level The clinic cannot treat you if you are pregnant or think you are pregnant  Sexually transmitted diseases are not treated at the clinic.    Dental Care: Organization         Address  Phone  Notes  [facility] Department of [facility] [address] [place], [place] [phone] Accepts children up to age 21 who are enrolled in [place] or [place] Health Choice; pregnant women with a Medicaid card; and children who have applied for Medicaid or [place] Health Choice, but were declined, whose parents can pay a reduced fee at time of service.  [facility] Department of [facility]  [address], [place] [phone] Accepts children up to age 58 who are enrolled in [place] or [place] Health Choice; pregnant women with a Medicaid card; and children who have applied for Medicaid or [place] Health Choice, but were  declined, whose parents can pay a reduced fee at time of service.  [facility]  [address] [place], [place] [phone] Patients are seen by appointment only. Walk-ins are not accepted. [facility] will see patients 36 years of age and older. Monday - Tuesday (8am-5pm) Most Wednesdays (8:30-5pm) $30 per visit, cash only  [facility]  [address], [facility] [phone] Patients are seen by appointment only. Walk-ins are not accepted. [facility] will see patients 5 years of age and older. One Wednesday Evening (Monthly: Volunteer Based).  $30 per visit, cash only  [employer]  [phone] for adults; Children under age 45, call Graduate Pediatric Dentistry at [phone]. Children aged 30-14, please call [phone] to request a pediatric application.  Dental services are provided in all areas of dental care including fillings, crowns and bridges, complete and partial dentures, implants, gum treatment, root canals, and extractions. Preventive care is also provided. Treatment is provided to both adults and children. Patients are selected via a lottery and there is often a waiting list.   [facility] [address], [place]  [phone] [email or web address]   [facility] [address] [place], [place] [phone], Ext. [phone] Second and Fourth Thursday of each month, opens at 6:30 AM; Clinic ends at 9 AM.  Patients are seen on a first-come first-served basis, and a limited number are seen during each clinic.   [facility]  [address] [place], [place] [phone]   Eligibility Requirements You must have lived in [place], [place], or [place] counties for at least the last three months.   You cannot be eligible for state or federal sponsored [place], including [employer], [place], or [employer].   You generally cannot be  eligible for healthcare insurance through your employer.    How to apply: Eligibility screenings are held every Tuesday and Wednesday afternoon from 1:00 pm until 4:00 pm. You do not need an appointment for the interview!  [facility] [address], [place], [place] [phone]   [facility] Health Department  [phone]   [facility] Health Department  [phone]   [facility] Health Department  [phone]    Behavioral Health Resources in the Community: Intensive Outpatient Programs Organization         Address  Phone  Notes  [facility] Services [address] [place],  [place] [phone]   [facility]patient [address] [place], [place] [phone]   [facility] [address] [place], [place]  [phone]   [facility] [address],  [place], [place] [phone] or [phone]   Substance Abuse Resources Organization         Address  Phone  Notes  [facility] Drug Services  [phone]   [facility]  [phone]   [facility]  [phone]   Floydene Flock  [phone]   Residential & Outpatient Substance Abuse Program  [phone]   Psychological Services Organization         Address  Phone  Notes  [facility]  [phone]   [facility] Services  [phone]   [facility] [address], [place] [phone] or [phone]    Mobile Crisis Teams Organization         Address  Phone  Notes  [facility], Mobile Crisis Care Unit  [phone]   [facility]  [address] [place], [place] [phone]   Doristine Locks [address] [place] [phone]    Self-Help/Support Groups Organization         Address  Phone             Notes  [facility] [place] - variety of support groups  336- I7437963 Call for more information    Narcotics Anonymous (NA), [facility] [address], [employer] [place]  2 meetings at this location   Statistician         Address  Phone  Notes  [facility] [address],    [place]  [phone]   [facility]  [address], [place] [address]242, [place], [place] [phone]   [facility] [address] [place], [place] [phone] Admissions: 8am-3pm M-F  [facility] [address],    [place], [place] [phone]   [facility] [address] [place], [place], [place] [phone]   [facility] [address],  [place], [place] [phone]   [facility] - Intensive Outpatient [address], [place], [place] [phone]   [facility] ([facility] [address]  [place], [place] [phone] or [phone]   [facility] (RTS) [address], [place], [place] [phone] Accepts Medicaid  [facility] [address],  [place] [phone] Substance Abuse/Addiction Treatment   [facility] Organization         Address  Phone  Notes  [facility]  [phone]   Angie Fava, PhD [address] [place], [place]   [phone] or [phone]   [facility] Behavioral   [address] [place], [place] [phone]   [facility] [address], [place], [place] [phone] Insurance/Medicaid/sponsorship through [facility] Families [address]                                    [place], [place] [phone] Therapy/tele-psych/case  [facility] [address], [place] [phone]    Dr. Lolly Mustache  [phone]   [facility] [place]  [employer] [facility] Dept. 1) [address], [place] 2) [address], [place] 3)  [address], [place] [phone]  [phone]   [facility] Child Abuse Hotline (541) 346-9710 or [phone])[phone] (After Hours)

## 2014-03-03 NOTE — ED Notes (Signed)
Pt refusing VS and calling the physician an "Idoit" Dr Ethelda ChickJacubowitz in to speak with pt who is belligerent with him and verbal that we do not care. Dr Ethelda ChickJacubowitz requested the pt find a PMD He told them that he would  Come back to the ED when he wanted too since he pays his bills. Dr Ethelda ChickJacubowitz reminded him his is chronic pain and should be followed up with a regular doctor. Pt escorted off premises by security.

## 2014-04-16 IMAGING — CR DG CHEST 1V PORT
1 series · 1 of 1 positions shown · non-contrast
Comparison: Two-view chest x-ray 06/16/2012, 05/05/2010,
12/03/2008.

CLINICAL DATA: Seizure.  Productive cough.

PORTABLE CHEST - 1 VIEW [DATE]/9721 2899 hours:

[AP]
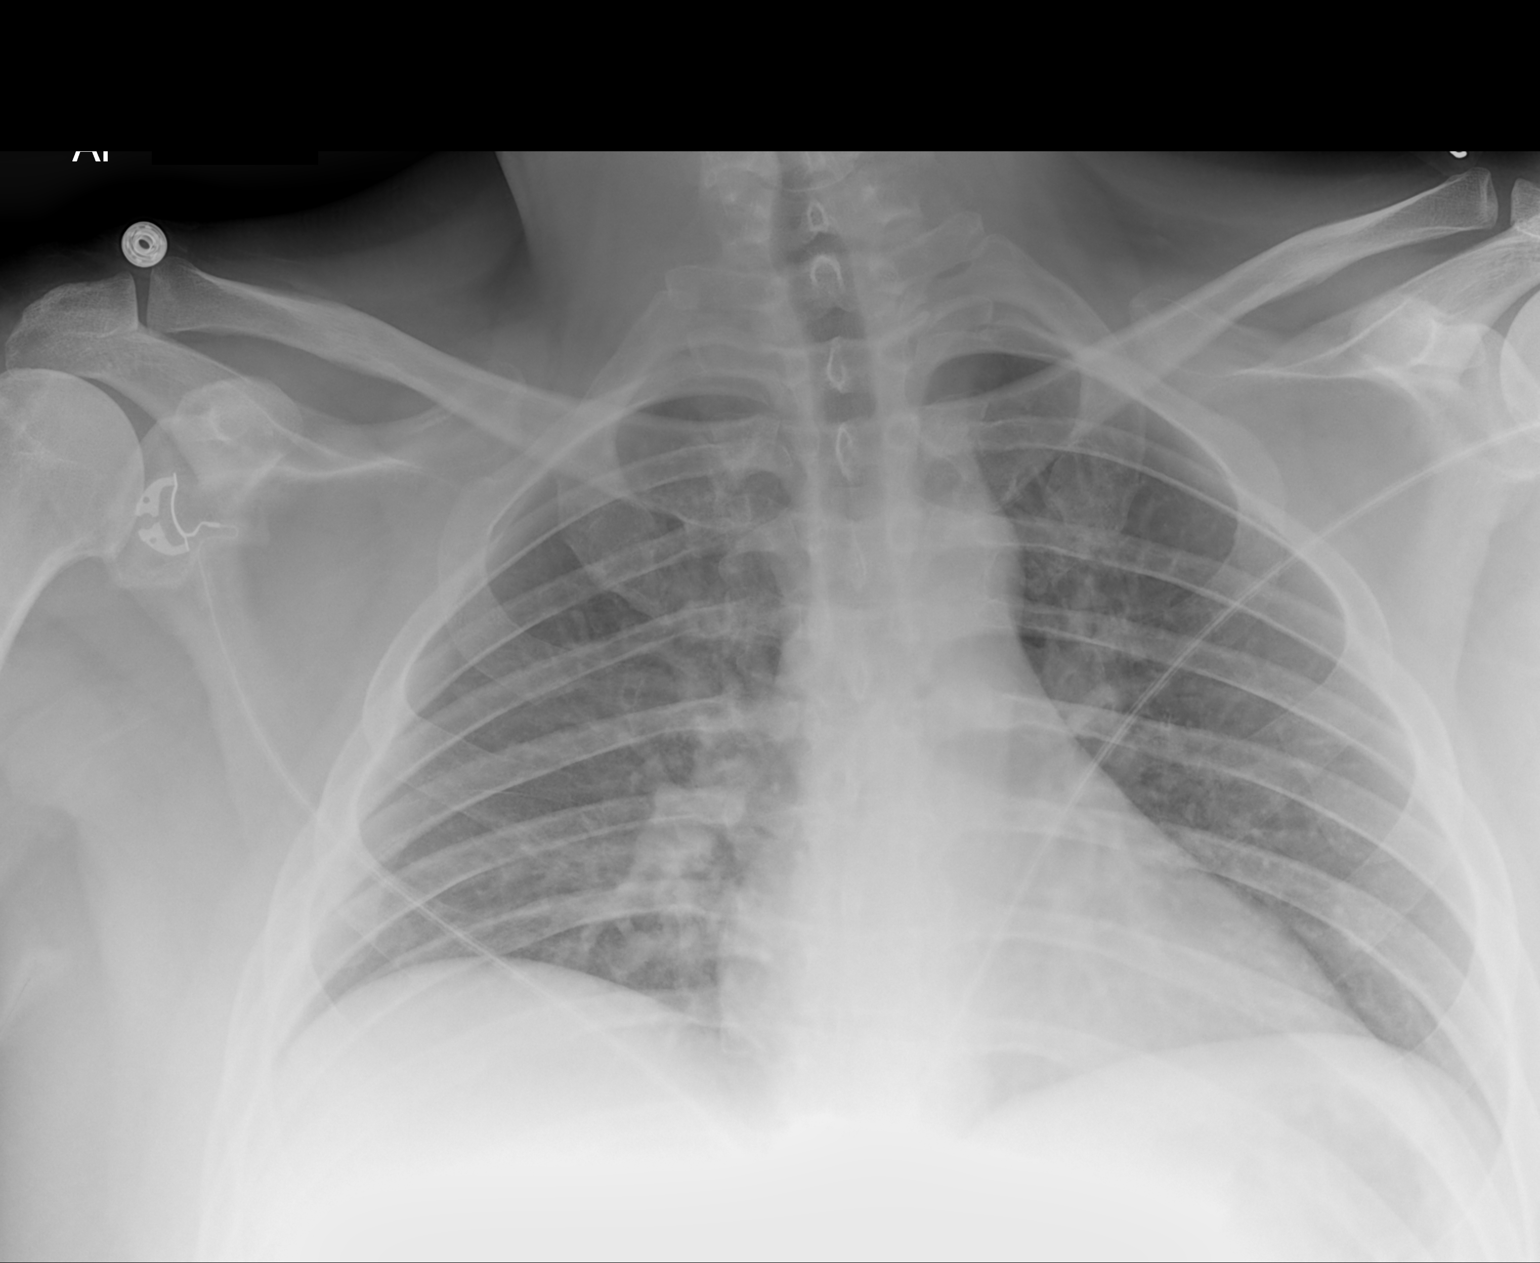

[1 of 1 positions shown; findings below may reference images not displayed]

FINDINGS: Suboptimal inspiration accounts for crowded
bronchovascular markings, especially in the lung bases, and
accentuates the cardiac silhouette.  Taking this into account,
cardiomediastinal silhouette unremarkable and lungs clear.
IMPRESSION: Suboptimal inspiration.  No acute cardiopulmonary disease.

## 2014-04-17 IMAGING — CT CT HEAD W/O CM
1 series · 16 of 30 positions shown, 20 images · non-contrast
Comparison: 10/14/2012

CLINICAL DATA: Headache with right-sided weakness.

CT HEAD WITHOUT CONTRAST
TECHNIQUE: Contiguous axial images were obtained from the base of
the skull through the vertex without contrast.

[Series 2: head 5.0 h30s · axial · 0.46mm/px · z∈[+1078,+1218]mm · 16 of 32 slices shown, 20 images]
[im 2/32  brain]
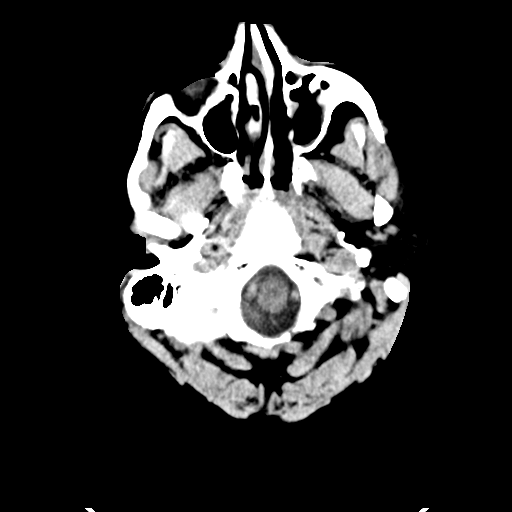
[im 2/32  bone]
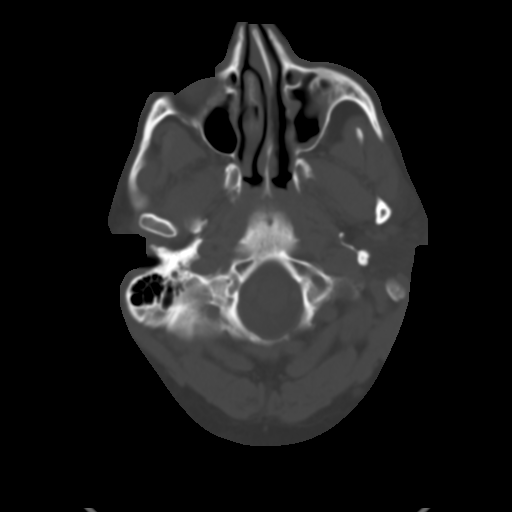
[im 4/32  brain]
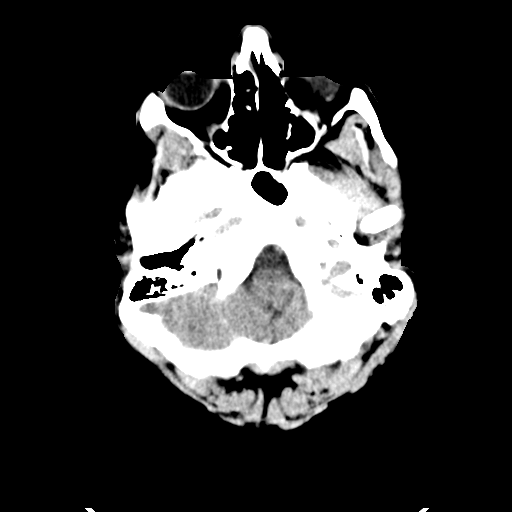
[im 6/32  brain]
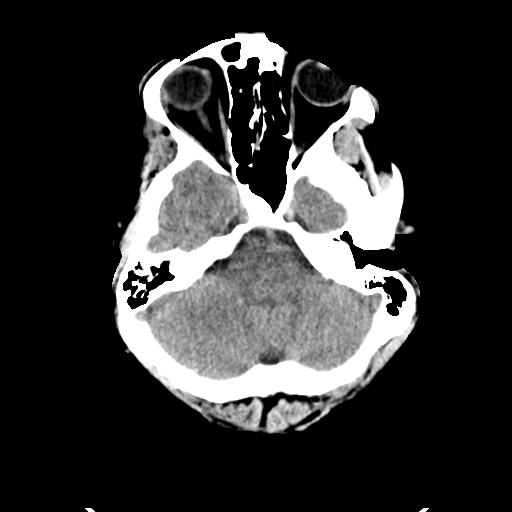
[im 8/32  brain]
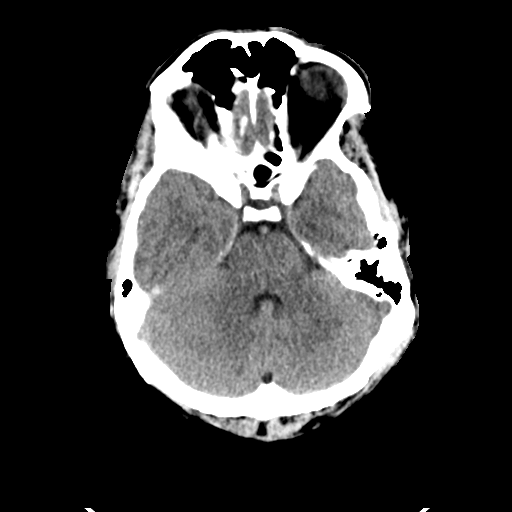
[im 9/32  brain]
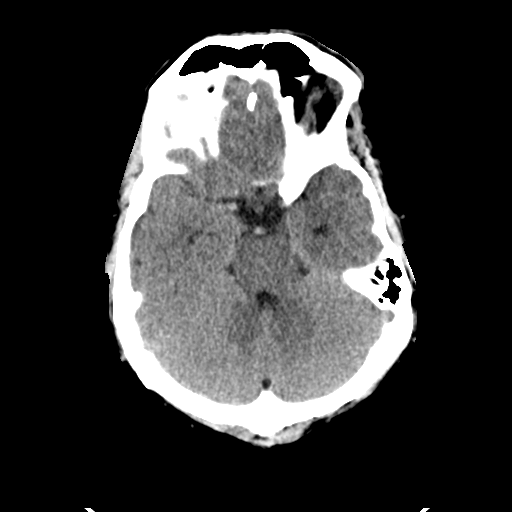
[im 9/32  bone]
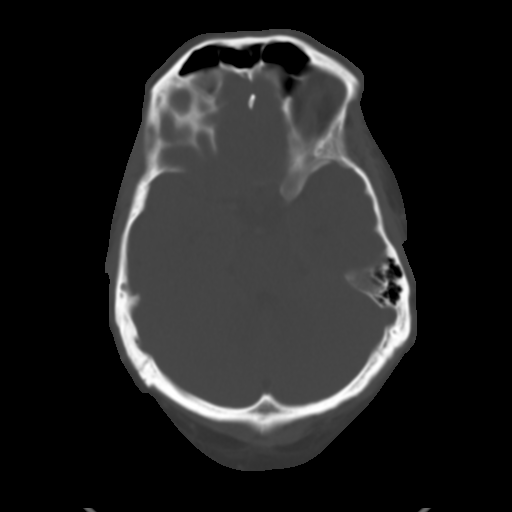
[im 11/32  brain]
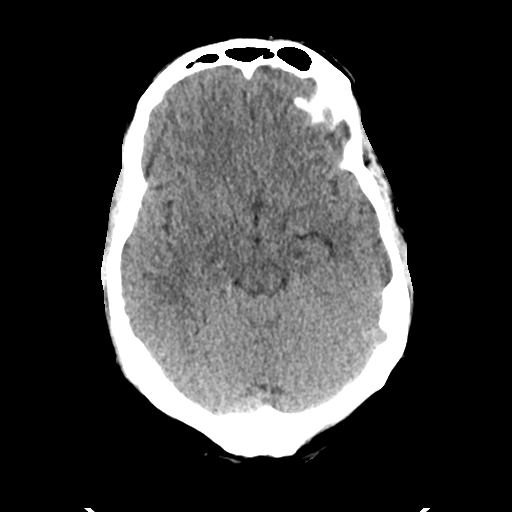
[im 13/32  brain]
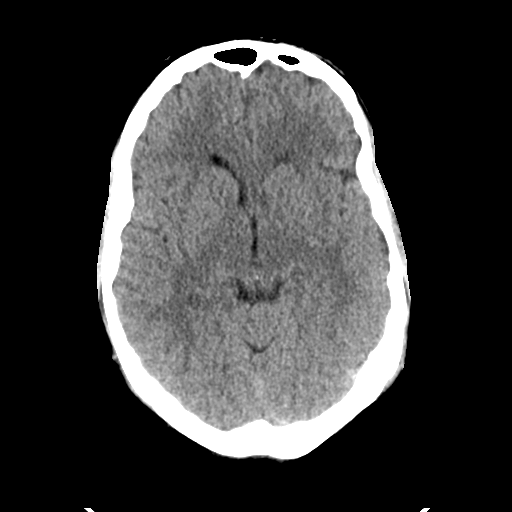
[im 15/32  brain]
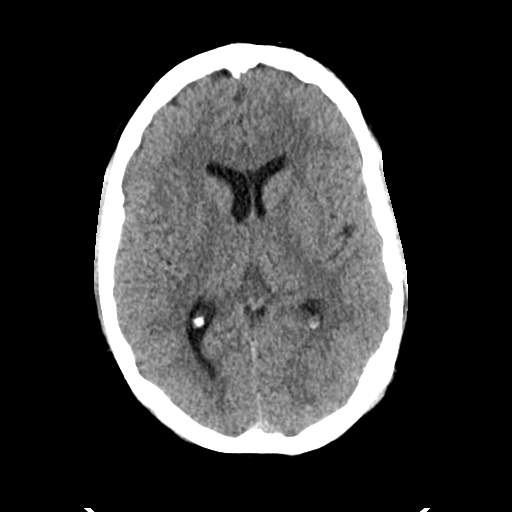
[im 17/32  brain]
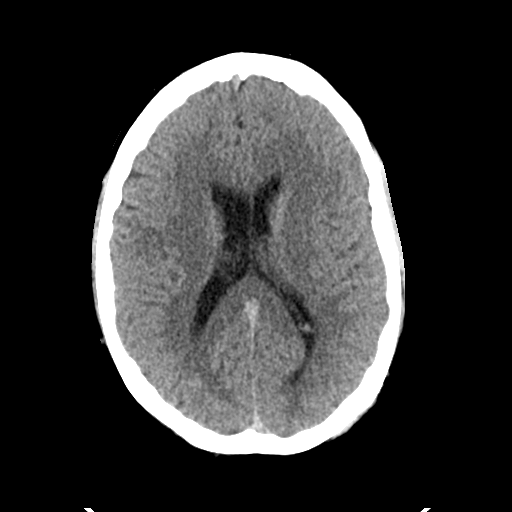
[im 17/32  bone]
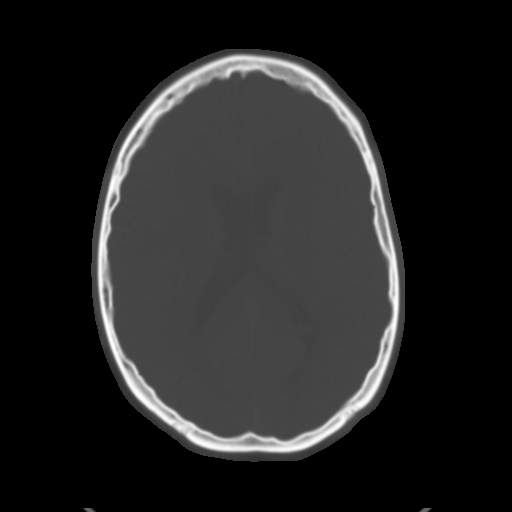
[im 19/32  brain]
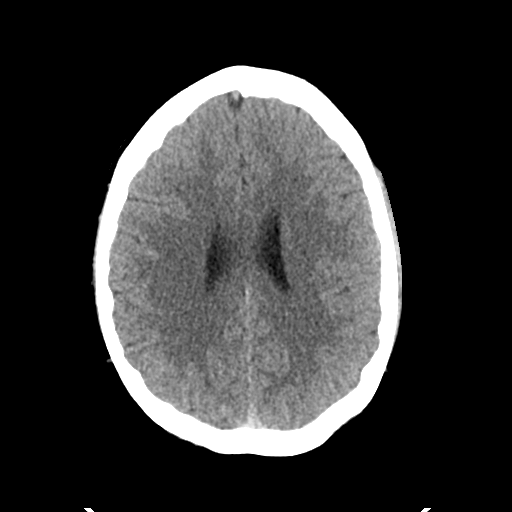
[im 21/32  brain]
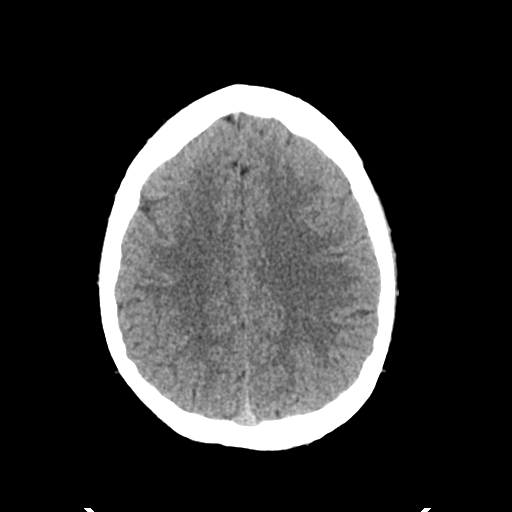
[im 23/32  brain]
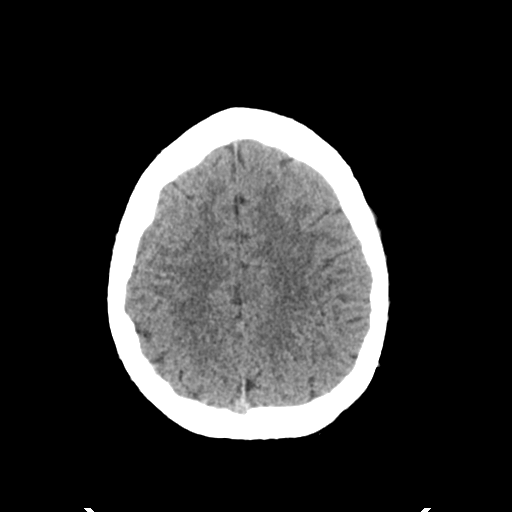
[im 24/32  brain]
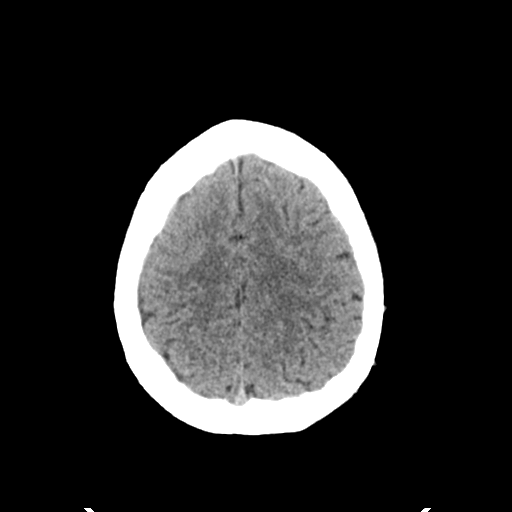
[im 24/32  bone]
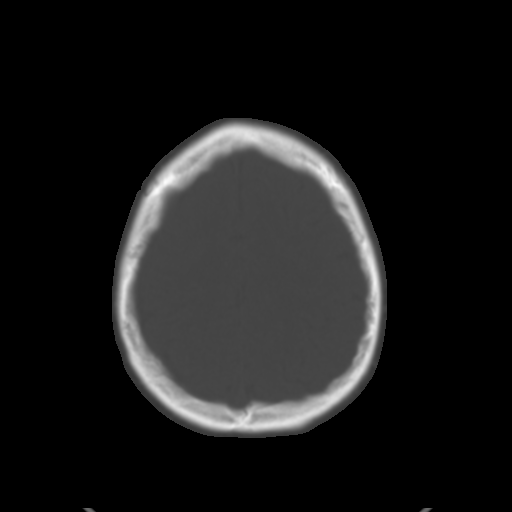
[im 26/32  brain]
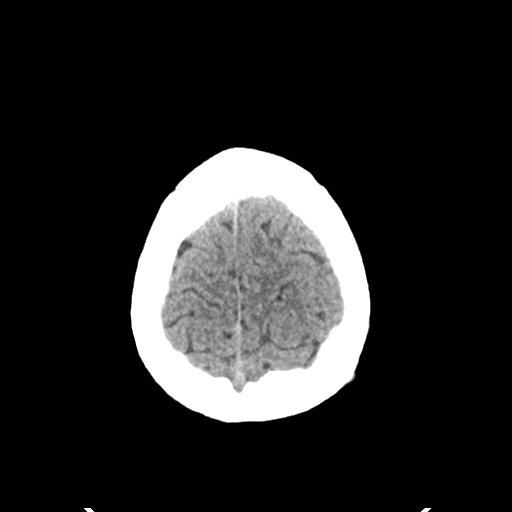
[im 28/32  brain]
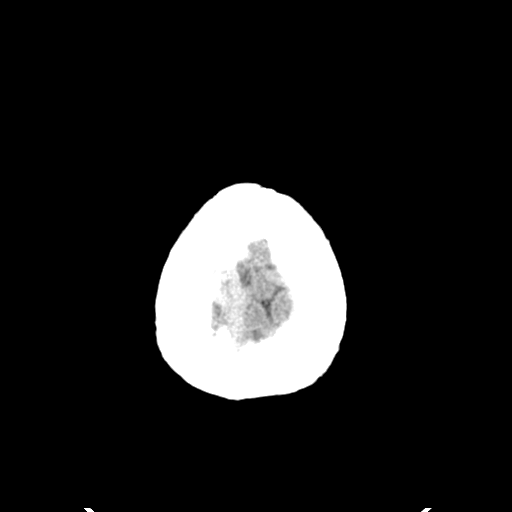
[im 30/32  brain]
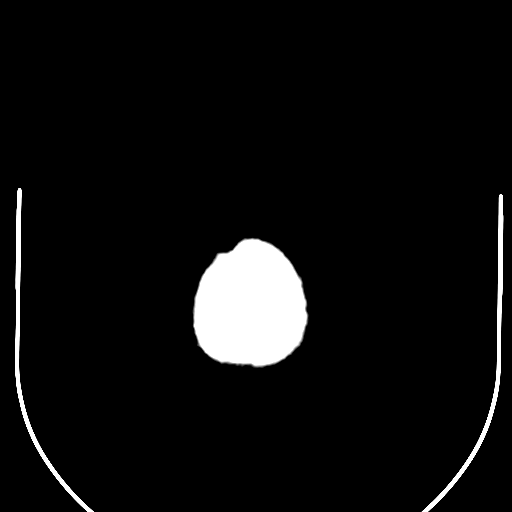

[16 of 30 positions shown; findings below may reference images not displayed]

FINDINGS: There is no evidence for acute hemorrhage, hydrocephalus,
mass lesion, or abnormal extra-axial fluid collection.  No definite
CT evidence for acute infarction.  Chronic left maxillary sinus
disease with remaining paranasal sinuses unremarkable.  The left
sphenoid sinus air-fluid levels seen previously has resolved in the
interval.
IMPRESSION: Stable intracranial exam.  No acute findings.

Interval resolution of air-fluid level in the left sphenoid sinus.

## 2014-05-11 ENCOUNTER — Emergency Department (HOSPITAL_COMMUNITY)
Admission: EM | Admit: 2014-05-11 | Discharge: 2014-05-11 | Disposition: A | Discharge status: Home / Self Care | Payer: BLUE CROSS/BLUE SHIELD | Attending: Emergency Medicine | Admitting: Emergency Medicine

## 2014-05-11 ENCOUNTER — Encounter (HOSPITAL_COMMUNITY): Payer: Self-pay | Admitting: *Deleted

## 2014-05-11 DIAGNOSIS — M79675 Pain in left toe(s): Secondary | ICD-10-CM | POA: Diagnosis present

## 2014-05-11 DIAGNOSIS — M7661 Achilles tendinitis, right leg: Secondary | ICD-10-CM | POA: Diagnosis not present

## 2014-05-11 DIAGNOSIS — G8929 Other chronic pain: Secondary | ICD-10-CM | POA: Diagnosis not present

## 2014-05-11 DIAGNOSIS — Z792 Long term (current) use of antibiotics: Secondary | ICD-10-CM | POA: Insufficient documentation

## 2014-05-11 DIAGNOSIS — Z720 Tobacco use: Secondary | ICD-10-CM | POA: Insufficient documentation

## 2014-05-11 MED ORDER — INDOMETHACIN 25 MG PO CAPS: 25.0000 mg | ORAL_CAPSULE | Freq: Three times a day (TID) | ORAL | Status: DC | PRN

## 2014-05-11 MED ORDER — TRAMADOL HCL 50 MG PO TABS: 50.0000 mg | ORAL_TABLET | Freq: Four times a day (QID) | ORAL | Status: DC | PRN

## 2014-05-11 MED ORDER — PREDNISONE 20 MG PO TABS: 20.0000 mg | ORAL_TABLET | Freq: Every day | ORAL | Status: DC

## 2014-05-11 NOTE — Discharge Instructions (Signed)
Indocin for inflammation and gout treatment. Prednisone for inflammation until all gone. Ultram for pain. Keep feet elevated. Follow up with orthopedics specialist.   Gout Gout is an inflammatory arthritis caused by a buildup of uric acid crystals in the joints. Uric acid is a chemical that is normally present in the blood. When the level of uric acid in the blood is too high it can form crystals that deposit in your joints and tissues. This causes joint redness, soreness, and swelling (inflammation). Repeat attacks are common. Over time, uric acid crystals can form into masses (tophi) near a joint, destroying bone and causing disfigurement. Gout is treatable and often preventable. CAUSES  The disease begins with elevated levels of uric acid in the blood. Uric acid is produced by your body when it breaks down a naturally found substance called purines. Certain foods you eat, such as meats and fish, contain high amounts of purines. Causes of an elevated uric acid level include:  Being passed down from parent to child (heredity).  Diseases that cause increased uric acid production (such as obesity, psoriasis, and certain cancers).  Excessive alcohol use.  Diet, especially diets rich in meat and seafood.  Medicines, including certain cancer-fighting medicines (chemotherapy), water pills (diuretics), and aspirin.  Chronic kidney disease. The kidneys are no longer able to remove uric acid well.  Problems with metabolism. Conditions strongly associated with gout include:  Obesity.  High blood pressure.  High cholesterol.  Diabetes. Not everyone with elevated uric acid levels gets gout. It is not understood why some people get gout and others do not. Surgery, joint injury, and eating too much of certain foods are some of the factors that can lead to gout attacks. SYMPTOMS   An attack of gout comes on quickly. It causes intense pain with redness, swelling, and warmth in a joint.  Fever can  occur.  Often, only one joint is involved. Certain joints are more commonly involved:  Base of the big toe.  Knee.  Ankle.  Wrist.  Finger. Without treatment, an attack usually goes away in a few days to weeks. Between attacks, you usually will not have symptoms, which is different from many other forms of arthritis. DIAGNOSIS  Your caregiver will suspect gout based on your symptoms and exam. In some cases, tests may be recommended. The tests may include:  Blood tests.  Urine tests.  X-rays.  Joint fluid exam. This exam requires a needle to remove fluid from the joint (arthrocentesis). Using a microscope, gout is confirmed when uric acid crystals are seen in the joint fluid. TREATMENT  There are two phases to gout treatment: treating the sudden onset (acute) attack and preventing attacks (prophylaxis).  Treatment of an Acute Attack.  Medicines are used. These include anti-inflammatory medicines or steroid medicines.  An injection of steroid medicine into the affected joint is sometimes necessary.  The painful joint is rested. Movement can worsen the arthritis.  You may use warm or cold treatments on painful joints, depending which works best for you.  Treatment to Prevent Attacks.  If you suffer from frequent gout attacks, your caregiver may advise preventive medicine. These medicines are started after the acute attack subsides. These medicines either help your kidneys eliminate uric acid from your body or decrease your uric acid production. You may need to stay on these medicines for a very long time.  The early phase of treatment with preventive medicine can be associated with an increase in acute gout attacks. For this reason,  during the first few months of treatment, your caregiver may also advise you to take medicines usually used for acute gout treatment. Be sure you understand your caregiver's directions. Your caregiver may make several adjustments to your medicine  dose before these medicines are effective.  Discuss dietary treatment with your caregiver or dietitian. Alcohol and drinks high in sugar and fructose and foods such as meat, poultry, and seafood can increase uric acid levels. Your caregiver or dietitian can advise you on drinks and foods that should be limited. HOME CARE INSTRUCTIONS   Do not take aspirin to relieve pain. This raises uric acid levels.  Only take over-the-counter or prescription medicines for pain, discomfort, or fever as directed by your caregiver.  Rest the joint as much as possible. When in bed, keep sheets and blankets off painful areas.  Keep the affected joint raised (elevated).  Apply warm or cold treatments to painful joints. Use of warm or cold treatments depends on which works best for you.  Use crutches if the painful joint is in your leg.  Drink enough fluids to keep your urine clear or pale yellow. This helps your body get rid of uric acid. Limit alcohol, sugary drinks, and fructose drinks.  Follow your dietary instructions. Pay careful attention to the amount of protein you eat. Your daily diet should emphasize fruits, vegetables, whole grains, and fat-free or low-fat milk products. Discuss the use of coffee, vitamin C, and cherries with your caregiver or dietitian. These may be helpful in lowering uric acid levels.  Maintain a healthy body weight. SEEK MEDICAL CARE IF:   You develop diarrhea, vomiting, or any side effects from medicines.  You do not feel better in 24 hours, or you are getting worse. SEEK IMMEDIATE MEDICAL CARE IF:   Your joint becomes suddenly more tender, and you have chills or a fever. MAKE SURE YOU:   Understand these instructions.  Will watch your condition.  Will get help right away if you are not doing well or get worse. Document Released: 03/20/2000 Document Revised: 08/07/2013 Document Reviewed: 11/04/2011 [facility] Patient Information 2015 [place], [place]. This information  is not intended to replace advice given to you by your health care provider. Make sure you discuss any questions you have with your health care provider.  Low-Purine Diet Purines are compounds that affect the level of uric acid in your body. A low-purine diet is a diet that is low in purines. Eating a low-purine diet can prevent the level of uric acid in your body from getting too high and causing gout or kidney stones or both. WHAT DO I NEED TO KNOW ABOUT THIS DIET?  Choose low-purine foods. Examples of low-purine foods are listed in the next section.  Drink plenty of fluids, especially water. Fluids can help remove uric acid from your body. Try to drink 8-16 cups (1.9-3.8 L) a day.  Limit foods high in fat, especially saturated fat, as fat makes it harder for the body to get rid of uric acid. Foods high in saturated fat include pizza, cheese, ice cream, whole milk, fried foods, and gravies. Choose foods that are lower in fat and lean sources of protein. Use olive oil when cooking as it contains healthy fats that are not high in saturated fat.  Limit alcohol. Alcohol interferes with the elimination of uric acid from your body. If you are having a gout attack, avoid all alcohol.  Keep in mind that different people's bodies react differently to different foods. You will  probably learn over time which foods do or do not affect you. If you discover that a food tends to cause your gout to flare up, avoid eating that food. You can more freely enjoy foods that do not cause problems. If you have any questions about a food item, talk to your dietitian or health care provider. WHICH FOODS ARE LOW, MODERATE, AND HIGH IN PURINES? The following is a list of foods that are low, moderate, and high in purines. You can eat any amount of the foods that are low in purines. You may be able to have small amounts of foods that are moderate in purines. Ask your health care provider how much of a food moderate in purines  you can have. Avoid foods high in purines. Grains  Foods low in purines: Enriched white bread, pasta, rice, cake, cornbread, popcorn.  Foods moderate in purines: Whole-grain breads and cereals, wheat germ, bran, oatmeal. Uncooked oatmeal. Dry wheat bran or wheat germ.  Foods high in purines: Pancakes, [place] toast, biscuits, muffins. Vegetables  Foods low in purines: All vegetables, except those that are moderate in purines.  Foods moderate in purines: Asparagus, cauliflower, spinach, mushrooms, green peas. Fruits  All fruits are low in purines. Meats and other Protein Foods  Foods low in purines: Eggs, nuts, peanut butter.  Foods moderate in purines: 80-90% lean beef, lamb, veal, pork, poultry, fish, eggs, peanut butter, nuts. Crab, lobster, oysters, and shrimp. Cooked dried beans, peas, and lentils.  Foods high in purines: Anchovies, sardines, herring, mussels, tuna, codfish, scallops, trout, and haddock. Brandon Blake. Organ meats (such as liver or kidney). Tripe. Game meat. Goose. Sweetbreads. Dairy  All dairy foods are low in purines. Low-fat and fat-free dairy products are best because they are low in saturated fat. Beverages  Drinks low in purines: Water, carbonated beverages, tea, coffee, cocoa.  Drinks moderate in purines: Soft drinks and other drinks sweetened with high-fructose corn syrup. Juices. To find whether a food or drink is sweetened with high-fructose corn syrup, look at the ingredients list.  Drinks high in purines: Alcoholic beverages (such as beer). Condiments  Foods low in purines: Salt, herbs, olives, pickles, relishes, vinegar.  Foods moderate in purines: Butter, margarine, oils, mayonnaise. Fats and Oils  Foods low in purines: All types, except gravies and sauces made with meat.  Foods high in purines: Gravies and sauces made with meat. Other Foods  Foods low in purines: Sugars, sweets, gelatin. Cake. Soups made without meat.  Foods moderate in  purines: Meat-based or fish-based soups, broths, or bouillons. Foods and drinks sweetened with high-fructose corn syrup.  Foods high in purines: High-fat desserts (such as ice cream, cookies, cakes, pies, doughnuts, and chocolate). Contact your dietitian for more information on foods that are not listed here. Document Released: 07/18/2010 Document Revised: 03/28/2013 Document Reviewed: 02/27/2013 [facility] Patient Information 2015 [place], [place]. This information is not intended to replace advice given to you by your health care provider. Make sure you discuss any questions you have with your health care provider.    Achilles Tendinitis Achilles tendinitis is inflammation of the tough, cord-like band that attaches the lower muscles of your leg to your heel (Achilles tendon). It is usually caused by overusing the tendon and joint involved.  CAUSES Achilles tendinitis can happen because of:  A sudden increase in exercise or activity (such as running).  Doing the same exercises or activities (such as jumping) over and over.  Not warming up calf muscles before exercising.  Exercising in  shoes that are worn out or not made for exercise.  Having arthritis or a bone growth on the back of the heel bone. This can rub against the tendon and hurt the tendon. SIGNS AND SYMPTOMS The most common symptoms are:  Pain in the back of the leg, just above the heel. The pain usually gets worse with exercise and better with rest.  Stiffness or soreness in the back of the leg, especially in the morning.  Swelling of the skin over the Achilles tendon.  Trouble standing on tiptoe. Sometimes, an Achilles tendon tears (ruptures). Symptoms of an Achilles tendon rupture can include:  Sudden, severe pain in the back of the leg.  Trouble putting weight on the foot or walking normally. DIAGNOSIS Achilles tendinitis will be diagnosed based on symptoms and a physical examination. An X-ray may be done to check  if another condition is causing your symptoms. An MRI may be ordered if your health care provider suspects you may have completely torn your tendon, which is called an Achilles tendon rupture.  TREATMENT  Achilles tendinitis usually gets better over time. It can take weeks to months to heal completely. Treatment focuses on treating the symptoms and helping the injury heal. HOME CARE INSTRUCTIONS   Rest your Achilles tendon and avoid activities that cause pain.  Apply ice to the injured area:  Put ice in a plastic bag.  Place a towel between your skin and the bag.  Leave the ice on for 20 minutes, 2-3 times a day  Try to avoid using the tendon (other than gentle range of motion) while the tendon is painful. Do not resume use until instructed by your health care provider. Then begin use gradually. Do not increase use to the point of pain. If pain does develop, decrease use and continue the above measures. Gradually increase activities that do not cause discomfort until you achieve normal use.  Do exercises to make your calf muscles stronger and more flexible. Your health care provider or physical therapist can recommend exercises for you to do.  Wrap your ankle with an elastic bandage or other wrap. This can help keep your tendon from moving too much. Your health care provider will show you how to wrap your ankle correctly.  Only take over-the-counter or prescription medicines for pain, discomfort, or fever as directed by your health care provider. SEEK MEDICAL CARE IF:   Your pain and swelling increase or pain is uncontrolled with medicines.  You develop new, unexplained symptoms or your symptoms get worse.  You are unable to move your toes or foot.  You develop warmth and swelling in your foot.  You have an unexplained temperature. MAKE SURE YOU:   Understand these instructions.  Will watch your condition.  Will get help right away if you are not doing well or get  worse. Document Released: 12/31/2004 Document Revised: 01/11/2013 Document Reviewed: 11/02/2012 [facility] Patient Information 2015 [place]. This information is not intended to replace advice given to you by your health care provider. Make sure you discuss any questions you have with your health care provider.

## 2014-05-11 NOTE — ED Notes (Signed)
thye pt is c/o lt great toe and rt anjle pain fir 2-3 days.  No injury

## 2014-05-11 NOTE — ED Provider Notes (Signed)
CSN: [id number]     Arrival date & time 05/11/14  1447 History  This chart was scribed for Brandon Crumbleatyana Ellinor Test, PA-C, working with Derwood KaplanAnkit Nanavati, MD by Chestine SporeSoijett Blue, ED Scribe. The patient was seen in room TR08C/TR08C at 3:39 PM.    Chief Complaint  Patient presents with  . Foot Pain     The history is provided by the patient. No language interpreter was used.    HPI Comments: Brandon Blake is a 34 y.o. male who presents to the Emergency Department complaining of fot pain onset 2-3 days. He notes that he is having left great toe pain. He reports that the knuckle on his left foot has began to swell. He denies injury to the area. He notes that he is having trouble with ambulation. He notes that he woke up with the pain. He states that he is having associated symptoms of right ankle pain, joint swelling. He states that he has tried [employer] Goody Powder with no relief for his symptoms. He denies fever and any other symptoms. He denies having a PCP. He notes that he has just gotten his insurance and he is waiting for them to tell him who he can see. He denies being a diabetic.   Past Medical History  Diagnosis Date  . Back pain, chronic   . Seizures    Past Surgical History  Procedure Laterality Date  . Cholecystectomy     Family History  Problem Relation Age of Onset  . Cancer Father   . Diabetes Other   . Coronary artery disease Other    History  Substance Use Topics  . Smoking status: Current Every Day Smoker -- 0.50 packs/day    Types: Cigarettes  . Smokeless tobacco: Not on file  . Alcohol Use: Yes     Comment: social    Review of Systems  Constitutional: Negative for fever.  Musculoskeletal: Positive for joint swelling and arthralgias.  All other systems reviewed and are negative.     Allergies  Naproxen  Home Medications   Prior to Admission medications   Medication Sig Start Date End Date Taking? Authorizing Provider  Aspirin-Acetaminophen (GOODYS BODY PAIN  PO) Take 2 tablets by mouth once.    Historical Provider, MD  cephALEXin (KEFLEX) 500 MG capsule Take 1 capsule (500 mg total) by mouth 4 (four) times daily. 02/28/14   [name], PA-C  HYDROcodone-acetaminophen (NORCO/VICODIN) 5-325 MG per tablet Take 2 tablets by mouth every 4 (four) hours as needed. 02/28/14   Heather Laisure, PA-C  methocarbamol (ROBAXIN) 500 MG tablet Take 1 tablet (500 mg total) by mouth 2 (two) times daily. Patient not taking: Reported on 02/28/2014 02/05/14   Garlon HatchetLisa M Sanders, PA-C  predniSONE (DELTASONE) 10 MG tablet Take 4 tablets (40 mg total) by mouth daily. Patient not taking: Reported on 02/28/2014 02/12/14   Tilden FossaElizabeth Rees, MD  sulfamethoxazole-trimethoprim [facility](SEPTRA DS) 800-160 MG per tablet Take 2 tablets by mouth 2 (two) times daily. 02/28/14   Heather Laisure, PA-C  traMADol (ULTRAM) 50 MG tablet Take 1 tablet (50 mg total) by mouth every 6 (six) hours as needed. Patient not taking: Reported on 02/28/2014 02/05/14   Garlon HatchetLisa M Sanders, PA-C   BP 119/71 mmHg  Pulse 84  Temp(Src) 98.2 F (36.8 C)  Resp 18  Ht 6' (1.829 m)  Wt 238 lb (107.956 kg)  BMI 32.27 kg/m2  SpO2 98%  Physical Exam  Constitutional: He is oriented to person, place, and time. He appears well-developed and  well-nourished. No distress.  HENT:  Head: Normocephalic and atraumatic.  Eyes: EOM are normal.  Neck: Neck supple. No tracheal deviation present.  Cardiovascular: Normal rate.   Pulmonary/Chest: Effort normal. No respiratory distress.  Musculoskeletal: Normal range of motion.  Swelling and mild erythema over left first MTP joint. Tender to palpation even to the lightest touch. Pain with range of motion of the toe. Normal the rest of the foot. No obvious swelling of the right ankle noted. Tender to palpation over lateral malleolus and Achilles tendon. Full range of motion with pain. DP pulses intact bilaterally.  Neurological: He is alert and oriented to person, place, and time.  Skin:  Skin is warm and dry.  Psychiatric: He has a normal mood and affect. His behavior is normal.  Nursing note and vitals reviewed.   ED Course  Procedures (including critical care time) DIAGNOSTIC STUDIES: Oxygen Saturation is 98% on room air, normal by my interpretation.    COORDINATION OF CARE: 3:44 PM-Discussed treatment plan which includes F/U and referral with orthopedist, Prednisone Rx and Ultram Rx with pt at bedside and pt agreed to plan.   Labs Review Labs Reviewed - No data to display  Imaging Review No results found.   EKG Interpretation None      MDM   Final diagnoses:  Great toe pain, left  Tendonitis, Achilles, right    Patient is here with a nontraumatic pain to the left first MTP joint of the foot as well as right ankle and right Achilles tendon. Left foot exam is most consistent with gout. I doubt it's infectious, no open skin, he is afebrile, nontoxic appearing. No surrounding cellulitis. I did discuss with patient possibility of this being infectious, and instructed to keep close eye and return if redness, swelling is worsening. OR develops a fever. right ankle exam is most consistent with Achilles tendinitis. His Achilles tendon is intact. There is no trauma to the ankle, I do not think he needs any imaging. Will discharge home with Indocin, prednisone, tramadol, follow-up with orthopedic specialist.   Filed Vitals:   05/11/14 1511  BP: 119/71  Pulse: 84  Temp: 98.2 F (36.8 C)  Resp: 18  Height: 6' (1.829 m)  Weight: 238 lb (107.956 kg)  SpO2: 98%     I personally performed the services described in this documentation, which was scribed in my presence. The recorded information has been reviewed and is accurate.    Lottie Mussel, PA-C 05/11/14 1556  Derwood Kaplan, MD 05/15/14 1051

## 2014-12-03 ENCOUNTER — Encounter (HOSPITAL_COMMUNITY): Payer: Self-pay | Admitting: Emergency Medicine

## 2014-12-03 ENCOUNTER — Emergency Department (HOSPITAL_COMMUNITY)
Admission: EM | Admit: 2014-12-03 | Discharge: 2014-12-03 | Disposition: A | Discharge status: Home / Self Care | Payer: 59 | Attending: Emergency Medicine | Admitting: Emergency Medicine

## 2014-12-03 DIAGNOSIS — Z792 Long term (current) use of antibiotics: Secondary | ICD-10-CM | POA: Insufficient documentation

## 2014-12-03 DIAGNOSIS — Z720 Tobacco use: Secondary | ICD-10-CM | POA: Diagnosis not present

## 2014-12-03 DIAGNOSIS — G8929 Other chronic pain: Secondary | ICD-10-CM | POA: Diagnosis not present

## 2014-12-03 DIAGNOSIS — R21 Rash and other nonspecific skin eruption: Secondary | ICD-10-CM

## 2014-12-03 MED ORDER — DIPHENHYDRAMINE HCL 50 MG/ML IJ SOLN
50.0000 mg | Freq: Once | INTRAMUSCULAR | Status: AC
  Administered 2014-12-03: 50 mg via INTRAMUSCULAR
  Filled 2014-12-03: qty 1

## 2014-12-03 MED ORDER — PREDNISONE 20 MG PO TABS
60.0000 mg | ORAL_TABLET | Freq: Once | ORAL | Status: AC
  Administered 2014-12-03: 60 mg via ORAL
  Filled 2014-12-03: qty 3

## 2014-12-03 MED ORDER — PREDNISONE 20 MG PO TABS: 40.0000 mg | ORAL_TABLET | Freq: Every day | ORAL | Status: DC

## 2014-12-03 NOTE — ED Provider Notes (Signed)
CSN: [id number]     Arrival date & time 12/03/14  0900 History   First MD Initiated Contact with Patient 12/03/14 [phone]     Chief Complaint  Patient presents with  . Rash     (Consider location/radiation/quality/duration/timing/severity/associated sxs/prior Treatment) HPI Comments: Patient here complaining of 4 days of whole-body rash with pruritus. Unknown allergen exposure. Denies any fever or chills. Has used Benadryl without relief. History of similar symptoms associated with naproxen use but denies any current exposure. No oral involvement. Symptoms persistent and nothing makes them better or worse.  Patient is a 34 y.o. male presenting with rash. The history is provided by the patient.  Rash   Past Medical History  Diagnosis Date  . Back pain, chronic   . Seizures    Past Surgical History  Procedure Laterality Date  . Cholecystectomy     Family History  Problem Relation Age of Onset  . Cancer Father   . Diabetes Other   . Coronary artery disease Other    Social History  Substance Use Topics  . Smoking status: Current Every Day Smoker -- 0.50 packs/day    Types: Cigarettes  . Smokeless tobacco: None  . Alcohol Use: Yes     Comment: social    Review of Systems  Skin: Positive for rash.  All other systems reviewed and are negative.     Allergies  Naproxen  Home Medications   Prior to Admission medications   Medication Sig Start Date End Date Taking? Authorizing Provider  Aspirin-Acetaminophen (GOODYS BODY PAIN PO) Take 2 tablets by mouth once.    Historical Provider, MD  cephALEXin (KEFLEX) 500 MG capsule Take 1 capsule (500 mg total) by mouth 4 (four) times daily. 02/28/14   Santiago Glad, PA-C  HYDROcodone-acetaminophen (NORCO/VICODIN) 5-325 MG per tablet Take 2 tablets by mouth every 4 (four) hours as needed. 02/28/14   Heather Laisure, PA-C  indomethacin (INDOCIN) 25 MG capsule Take 1 capsule (25 mg total) by mouth 3 (three) times daily as needed.  05/11/14   Tatyana Kirichenko, PA-C  methocarbamol (ROBAXIN) 500 MG tablet Take 1 tablet (500 mg total) by mouth 2 (two) times daily. Patient not taking: Reported on 02/28/2014 02/05/14   Garlon Hatchet, PA-C  predniSONE (DELTASONE) 20 MG tablet Take 2 tablets (40 mg total) by mouth daily. 12/03/14   Lorre Nick, MD  sulfamethoxazole-trimethoprim (SEPTRA DS) 800-160 MG per tablet Take 2 tablets by mouth 2 (two) times daily. 02/28/14   Heather Laisure, PA-C  traMADol (ULTRAM) 50 MG tablet Take 1 tablet (50 mg total) by mouth every 6 (six) hours as needed. 05/11/14   Tatyana Kirichenko, PA-C   BP 150/77 mmHg  Pulse 90  Temp(Src) 98.4 F (36.9 C) (Oral)  Resp 20  SpO2 99% Physical Exam  Constitutional: He is oriented to person, place, and time. He appears well-developed and well-nourished.  Non-toxic appearance. No distress.  HENT:  Head: Normocephalic and atraumatic.  Eyes: Conjunctivae, EOM and lids are normal. Pupils are equal, round, and reactive to light.  Neck: Normal range of motion. Neck supple. No tracheal deviation present. No thyroid mass present.  Cardiovascular: Normal rate, regular rhythm and normal heart sounds.  Exam reveals no gallop.   No murmur heard. Pulmonary/Chest: Effort normal and breath sounds normal. No stridor. No respiratory distress. He has no decreased breath sounds. He has no wheezes. He has no rhonchi. He has no rales.  Abdominal: Soft. Normal appearance and bowel sounds are normal. He exhibits no distension.  There is no tenderness. There is no rebound and no CVA tenderness.  Musculoskeletal: Normal range of motion. He exhibits no edema or tenderness.  Neurological: He is alert and oriented to person, place, and time. He has normal strength. No cranial nerve deficit or sensory deficit. GCS eye subscore is 4. GCS verbal subscore is 5. GCS motor subscore is 6.  Skin: Skin is warm and dry. Rash noted. No abrasion noted. Rash is macular.  Psychiatric: He has a normal  mood and affect. His speech is normal and behavior is normal.  Nursing note and vitals reviewed.   ED Course  Procedures (including critical care time) Labs Review Labs Reviewed - No data to display  Imaging Review No results found. I have personally reviewed and evaluated these images and lab results as part of my medical decision-making.   EKG Interpretation None      MDM   Final diagnoses:  Rash    Diffuse macular rash noted to patient's body. Patient given Benadryl and prednisone here and referral to dermatology    Lorre Nick, MD 12/03/14 [phone]

## 2014-12-03 NOTE — ED Notes (Signed)
Pt has had a red raised rash to hands that radiates to entire body getting worse for the past 3 days. States that he has had this in the past and they thought it was an allergy to naproxen. Denies any new medications, no new lotions or detergents. The rash it painful to touch, no visible blisters or open wounds, no one else at home is sick or has this, denies any fevers. Has taken benadryl, allergy meds OTC with no relief.

## 2014-12-03 NOTE — Discharge Instructions (Signed)
Use Benadryl as directed for itching Allergies Allergies may happen from anything your body is sensitive to. This may be food, medicines, pollens, chemicals, and nearly anything around you in everyday life that produces allergens. An allergen is anything that causes an allergy producing substance. Heredity is often a factor in causing these problems. This means you may have some of the same allergies as your parents. Food allergies happen in all age groups. Food allergies are some of the most severe and life threatening. Some common food allergies are cow's milk, seafood, eggs, nuts, wheat, and soybeans. SYMPTOMS   Swelling around the mouth.  An itchy red rash or hives.  Vomiting or diarrhea.  Difficulty breathing. SEVERE ALLERGIC REACTIONS ARE LIFE-THREATENING. This reaction is called anaphylaxis. It can cause the mouth and throat to swell and cause difficulty with breathing and swallowing. In severe reactions only a trace amount of food (for example, peanut oil in a salad) may cause death within seconds. Seasonal allergies occur in all age groups. These are seasonal because they usually occur during the same season every year. They may be a reaction to molds, grass pollens, or tree pollens. Other causes of problems are house dust mite allergens, pet dander, and mold spores. The symptoms often consist of nasal congestion, a runny itchy nose associated with sneezing, and tearing itchy eyes. There is often an associated itching of the mouth and ears. The problems happen when you come in contact with pollens and other allergens. Allergens are the particles in the air that the body reacts to with an allergic reaction. This causes you to release allergic antibodies. Through a chain of events, these eventually cause you to release histamine into the blood stream. Although it is meant to be protective to the body, it is this release that causes your discomfort. This is why you were given anti-histamines to  feel better. If you are unable to pinpoint the offending allergen, it may be determined by skin or blood testing. Allergies cannot be cured but can be controlled with medicine. Hay fever is a collection of all or some of the seasonal allergy problems. It may often be treated with simple over-the-counter medicine such as diphenhydramine. Take medicine as directed. Do not drink alcohol or drive while taking this medicine. Check with your caregiver or package insert for child dosages. If these medicines are not effective, there are many new medicines your caregiver can prescribe. Stronger medicine such as nasal spray, eye drops, and corticosteroids may be used if the first things you try do not work well. Other treatments such as immunotherapy or desensitizing injections can be used if all else fails. Follow up with your caregiver if problems continue. These seasonal allergies are usually not life threatening. They are generally more of a nuisance that can often be handled using medicine. HOME CARE INSTRUCTIONS   If unsure what causes a reaction, keep a diary of foods eaten and symptoms that follow. Avoid foods that cause reactions.  If hives or rash are present:  Take medicine as directed.  You may use an over-the-counter antihistamine (diphenhydramine) for hives and itching as needed.  Apply cold compresses (cloths) to the skin or take baths in cool water. Avoid hot baths or showers. Heat will make a rash and itching worse.  If you are severely allergic:  Following a treatment for a severe reaction, hospitalization is often required for closer follow-up.  Wear a medic-alert bracelet or necklace stating the allergy.  You and your family must learn  how to give adrenaline or use an anaphylaxis kit.  If you have had a severe reaction, always carry your anaphylaxis kit or EpiPen with you. Use this medicine as directed by your caregiver if a severe reaction is occurring. Failure to do so could have  a fatal outcome. SEEK MEDICAL CARE IF:  You suspect a food allergy. Symptoms generally happen within 30 minutes of eating a food.  Your symptoms have not gone away within 2 days or are getting worse.  You develop new symptoms.  You want to retest yourself or your child with a food or drink you think causes an allergic reaction. Never do this if an anaphylactic reaction to that food or drink has happened before. Only do this under the care of a caregiver. SEEK IMMEDIATE MEDICAL CARE IF:   You have difficulty breathing, are wheezing, or have a tight feeling in your chest or throat.  You have a swollen mouth, or you have hives, swelling, or itching all over your body.  You have had a severe reaction that has responded to your anaphylaxis kit or an EpiPen. These reactions may return when the medicine has worn off. These reactions should be considered life threatening. MAKE SURE YOU:   Understand these instructions.  Will watch your condition.  Will get help right away if you are not doing well or get worse. Document Released: 06/16/2002 Document Revised: 07/18/2012 Document Reviewed: 11/21/2007 [facility] Patient Information 2015 [place], [place]. This information is not intended to replace advice given to you by your health care provider. Make sure you discuss any questions you have with your health care provider. Rash A rash is a change in the color or texture of your skin. There are many different types of rashes. You may have other problems that accompany your rash. CAUSES   Infections.  Allergic reactions. This can include allergies to pets or foods.  Certain medicines.  Exposure to certain chemicals, soaps, or cosmetics.  Heat.  Exposure to poisonous plants.  Tumors, both cancerous and noncancerous. SYMPTOMS   Redness.  Scaly skin.  Itchy skin.  Dry or cracked skin.  Bumps.  Blisters.  Pain. DIAGNOSIS  Your caregiver may do a physical exam to determine what  type of rash you have. A skin sample (biopsy) may be taken and examined under a microscope. TREATMENT  Treatment depends on the type of rash you have. Your caregiver may prescribe certain medicines. For serious conditions, you may need to see a skin doctor (dermatologist). HOME CARE INSTRUCTIONS   Avoid the substance that caused your rash.  Do not scratch your rash. This can cause infection.  You may take cool baths to help stop itching.  Only take over-the-counter or prescription medicines as directed by your caregiver.  Keep all follow-up appointments as directed by your caregiver. SEEK IMMEDIATE MEDICAL CARE IF:  You have increasing pain, swelling, or redness.  You have a fever.  You have new or severe symptoms.  You have body aches, diarrhea, or vomiting.  Your rash is not better after 3 days. MAKE SURE YOU:  Understand these instructions.  Will watch your condition.  Will get help right away if you are not doing well or get worse. Document Released: 03/13/2002 Document Revised: 06/15/2011 Document Reviewed: 01/05/2011 [facility] Patient Information 2015 [place], [place]. This information is not intended to replace advice given to you by your health care provider. Make sure you discuss any questions you have with your health care provider.

## 2015-02-07 ENCOUNTER — Emergency Department (HOSPITAL_COMMUNITY)
Admission: EM | Admit: 2015-02-07 | Discharge: 2015-02-07 | Disposition: A | Discharge status: Home / Self Care | Payer: 59 | Attending: Emergency Medicine | Admitting: Emergency Medicine

## 2015-02-07 ENCOUNTER — Encounter (HOSPITAL_COMMUNITY): Payer: Self-pay | Admitting: Emergency Medicine

## 2015-02-07 DIAGNOSIS — L03116 Cellulitis of left lower limb: Secondary | ICD-10-CM | POA: Insufficient documentation

## 2015-02-07 DIAGNOSIS — M25562 Pain in left knee: Secondary | ICD-10-CM | POA: Diagnosis present

## 2015-02-07 DIAGNOSIS — G8929 Other chronic pain: Secondary | ICD-10-CM | POA: Insufficient documentation

## 2015-02-07 DIAGNOSIS — Z720 Tobacco use: Secondary | ICD-10-CM | POA: Diagnosis not present

## 2015-02-07 MED ORDER — HYDROCODONE-ACETAMINOPHEN 5-325 MG PO TABS
1.0000 | ORAL_TABLET | Freq: Once | ORAL | Status: AC
  Administered 2015-02-07: 1 via ORAL
  Filled 2015-02-07: qty 1

## 2015-02-07 MED ORDER — LIDOCAINE-EPINEPHRINE 2 %-1:100000 IJ SOLN
INTRAMUSCULAR | Status: AC
  Administered 2015-02-07: 1 mL
  Filled 2015-02-07: qty 1

## 2015-02-07 MED ORDER — SODIUM BICARBONATE 4 % IV SOLN
5.0000 mL | Freq: Once | INTRAVENOUS | Status: AC
  Administered 2015-02-07: 5 mL via SUBCUTANEOUS
  Filled 2015-02-07: qty 5

## 2015-02-07 MED ORDER — CLINDAMYCIN HCL 300 MG PO CAPS
300.0000 mg | ORAL_CAPSULE | Freq: Once | ORAL | Status: AC
  Administered 2015-02-07: 300 mg via ORAL
  Filled 2015-02-07: qty 1

## 2015-02-07 MED ORDER — ONDANSETRON 4 MG PO TBDP
4.0000 mg | ORAL_TABLET | Freq: Once | ORAL | Status: AC
  Administered 2015-02-07: 4 mg via ORAL
  Filled 2015-02-07: qty 1

## 2015-02-07 MED ORDER — CEPHALEXIN 500 MG PO CAPS: 500.0000 mg | ORAL_CAPSULE | Freq: Two times a day (BID) | ORAL | Status: DC

## 2015-02-07 MED ORDER — TRAMADOL HCL 50 MG PO TABS: 50.0000 mg | ORAL_TABLET | Freq: Four times a day (QID) | ORAL | Status: DC | PRN

## 2015-02-07 MED ORDER — SULFAMETHOXAZOLE-TRIMETHOPRIM 800-160 MG PO TABS: 1.0000 | ORAL_TABLET | Freq: Two times a day (BID) | ORAL | Status: AC

## 2015-02-07 MED ORDER — LIDOCAINE-EPINEPHRINE (PF) 2 %-1:200000 IJ SOLN: 10.0000 mL | Freq: Once | INTRAMUSCULAR | Status: DC

## 2015-02-07 MED ORDER — LIDOCAINE HCL 2 % IJ SOLN
INTRAMUSCULAR | Status: AC
  Filled 2015-02-07: qty 20

## 2015-02-07 NOTE — ED Notes (Signed)
Pt states that he has been having knee pain since last night.  Also c/o subjective swelling. No injury.

## 2015-02-07 NOTE — ED Notes (Signed)
Pt discharged v/s within normal.

## 2015-02-07 NOTE — Discharge Instructions (Signed)

## 2015-02-07 NOTE — ED Notes (Signed)
Lidocaine 4320ml/ 2% 1:100,000 at bedside with sodium bicarbonate neutralizing agent at bed side for provider.

## 2015-02-07 NOTE — ED Provider Notes (Signed)
CSN: [id number]     Arrival date & time 02/07/15  [id number] History   First MD Initiated Contact with Patient 02/07/15 [phone]     Chief Complaint  Patient presents with  . Knee Pain     (Consider location/radiation/quality/duration/timing/severity/associated sxs/prior Treatment) HPI   [facility] PMH: back pain, seizures hx of abscesses  Brandon Blake is a 34 y.o.  male  CHIEF COMPLAINT: left knee pain  Time of onset: noticed knee pain that started last night        Quality:  Throbbing and pressure        Severity:  moderate        Progression:  worsening Chronicity: acute Risk factors: multiple superficial wounds (bug bites and scratches) to legs due to work Treatments tried:  none Relieved by: resting the knee but does not completley resolve symptoms Worsened by: movement and touch Associated Symptoms:  Pain, redness and swelling.  ROS: The patient denies diaphoresis, fever, headache, weakness (general or focal), confusion, change of vision,  dysphagia, aphagia, shortness of breath,  abdominal pains, nausea, vomiting, diarrhea,  rash, neck pain, chest pain   Past Medical History  Diagnosis Date  . Back pain, chronic   . Seizures [facility](HCC)    Past Surgical History  Procedure Laterality Date  . Cholecystectomy     Family History  Problem Relation Age of Onset  . Cancer Father   . Diabetes Other   . Coronary artery disease Other    Social History  Substance Use Topics  . Smoking status: Current Every Day Smoker -- 0.50 packs/day    Types: Cigarettes  . Smokeless tobacco: None  . Alcohol Use: Yes     Comment: social    Review of Systems  10 Systems reviewed and are negative for acute change except as noted in the HPI.   Allergies  Naproxen  Home Medications   Prior to Admission medications   Medication Sig Start Date End Date Taking? Authorizing Provider  Aspirin-Acetaminophen (GOODYS BODY PAIN PO) Take 2 packets by mouth every 4 (four)  hours as needed (for pain).    Yes Historical Provider, MD  cephALEXin (KEFLEX) 500 MG capsule Take 1 capsule (500 mg total) by mouth 2 (two) times daily. 02/07/15   [name] [name], PA-C  predniSONE (DELTASONE) 20 MG tablet Take 2 tablets (40 mg total) by mouth daily. Patient not taking: Reported on 02/07/2015 12/03/14   [name], MD  sulfamethoxazole-trimethoprim (BACTRIM DS,SEPTRA DS) 800-160 MG tablet Take 1 tablet by mouth 2 (two) times daily. 02/07/15 02/14/15  Bauer Ausborn [name], PA-C  sulfamethoxazole-trimethoprim (SEPTRA DS) 800-160 MG per tablet Take 2 tablets by mouth 2 (two) times daily. Patient not taking: Reported on 02/07/2015 02/28/14   [name], PA-C  traMADol (ULTRAM) 50 MG tablet Take 1 tablet (50 mg total) by mouth every 6 (six) hours as needed. 02/07/15   [name] [name], PA-C   BP 120/61 mmHg  Pulse 82  Temp(Src) 98 F (36.7 C) (Oral)  Resp 18  SpO2 100% Physical Exam  Constitutional: He appears well-developed and well-nourished. No distress.  HENT:  Head: Normocephalic and atraumatic.  Eyes: Pupils are equal, round, and reactive to light.  Neck: Normal range of motion. Neck supple.  Cardiovascular: Normal rate and regular rhythm.   Pulmonary/Chest: Effort normal.  Abdominal: Soft.  Musculoskeletal:  Right knee: Minimal swelling to right knee Erythema covers large portion of anterior knee with warmth to touch Small area of induration noted 2 x 2  cm noted to the lateral distal knee, small bite mark associated. No fluid drainage. FROM of knee with some mild discomfort, intact distal pulses.  Neurological: He is alert.  Skin: Skin is warm and dry.  Nursing note and vitals reviewed.   ED Course  Procedures (including critical care time) Labs Review Labs Reviewed - No data to display  Imaging Review No results found. I have personally reviewed and evaluated these images and lab results as part of my medical decision-making.   EKG Interpretation None       MDM   Final diagnoses:  Cellulitis of left lower extremity   The patient has been given pros/cons of attempted I&D, it is unclear if there is pus drainage or not. Pt elects to move forward with the I&D. Will start on abx and rx crutches/  INCISION AND DRAINAGE Performed by: Dorthula Matas Consent: Verbal consent obtained. Risks and benefits: risks, benefits and alternatives were discussed Type: abscess  Body area: left knee  Anesthesia: local infiltration  Incision was made with a scalpel.  Local anesthetic: lidocaine 2% with epinephrine  Anesthetic total: 3 ml  Complexity: simple   Drainage: bloody  Drainage amount: None  Packing material: None  Patient tolerance: Patient tolerated the procedure well with no immediate complications.    Clinically low suspicion for septic joint. P is well appearing. Normal vitals signs Rx: Ultram, Bactrim and Keflex.  I&D attempted but with no fluid return. Return precautions given.  Medications  lidocaine-EPINEPHrine (XYLOCAINE W/EPI) 2 %-1:200000 (PF) injection 10 mL (not administered)  sodium bicarbonate (NEUT) 4 % injection 5 mL (not administered)  lidocaine (XYLOCAINE) 2 % (with pres) injection (not administered)  lidocaine-EPINEPHrine (XYLOCAINE W/EPI) 2 %-1:100000 (with pres) injection (not administered)  clindamycin (CLEOCIN) capsule 300 mg (300 mg Oral Given 02/07/15 0944)  HYDROcodone-acetaminophen (NORCO/VICODIN) 5-325 MG per tablet 1 tablet (1 tablet Oral Given 02/07/15 0946)  ondansetron (ZOFRAN-ODT) disintegrating tablet 4 mg (4 mg Oral Given 02/07/15 0946)    33 y.o.[name]'s medical screening exam was performed and I feel the patient has had an appropriate workup for their chief complaint at this time and likelihood of emergent condition existing is low. They have been counseled on decision, discharge, follow up and which symptoms necessitate immediate return to the emergency department. They or their  family verbally stated understanding and agreement with plan and discharged in stable condition.   Vital signs are stable at discharge. Filed Vitals:   02/07/15 0905  BP: 120/61  Pulse: 82  Temp: 98 F (36.7 C)  Resp: [address], PA-C 02/07/15 1047  Arby Barrette, MD 02/07/15 1709

## 2015-04-11 ENCOUNTER — Emergency Department (HOSPITAL_COMMUNITY)
Admission: EM | Admit: 2015-04-11 | Discharge: 2015-04-11 | Disposition: A | Discharge status: Home / Self Care | Payer: 59 | Attending: Emergency Medicine | Admitting: Emergency Medicine

## 2015-04-11 ENCOUNTER — Encounter (HOSPITAL_COMMUNITY): Payer: Self-pay | Admitting: Emergency Medicine

## 2015-04-11 ENCOUNTER — Emergency Department (HOSPITAL_COMMUNITY): Payer: 59

## 2015-04-11 DIAGNOSIS — R0981 Nasal congestion: Secondary | ICD-10-CM | POA: Diagnosis present

## 2015-04-11 DIAGNOSIS — F17210 Nicotine dependence, cigarettes, uncomplicated: Secondary | ICD-10-CM | POA: Diagnosis not present

## 2015-04-11 DIAGNOSIS — Z79899 Other long term (current) drug therapy: Secondary | ICD-10-CM | POA: Diagnosis not present

## 2015-04-11 DIAGNOSIS — G8929 Other chronic pain: Secondary | ICD-10-CM | POA: Diagnosis not present

## 2015-04-11 DIAGNOSIS — Z792 Long term (current) use of antibiotics: Secondary | ICD-10-CM | POA: Diagnosis not present

## 2015-04-11 DIAGNOSIS — J209 Acute bronchitis, unspecified: Secondary | ICD-10-CM

## 2015-04-11 MED ORDER — ALBUTEROL SULFATE (2.5 MG/3ML) 0.083% IN NEBU
5.0000 mg | INHALATION_SOLUTION | Freq: Once | RESPIRATORY_TRACT | Status: AC
  Administered 2015-04-11: 5 mg via RESPIRATORY_TRACT
  Filled 2015-04-11: qty 6

## 2015-04-11 MED ORDER — PREDNISONE 20 MG PO TABS: 40.0000 mg | ORAL_TABLET | Freq: Every day | ORAL | Status: DC

## 2015-04-11 MED ORDER — IPRATROPIUM BROMIDE 0.02 % IN SOLN
0.5000 mg | Freq: Once | RESPIRATORY_TRACT | Status: AC
  Administered 2015-04-11: 0.5 mg via RESPIRATORY_TRACT
  Filled 2015-04-11: qty 2.5

## 2015-04-11 MED ORDER — BENZONATATE 100 MG PO CAPS: 100.0000 mg | ORAL_CAPSULE | Freq: Three times a day (TID) | ORAL | Status: DC | PRN

## 2015-04-11 MED ORDER — ALBUTEROL SULFATE HFA 108 (90 BASE) MCG/ACT IN AERS: 2.0000 | INHALATION_SPRAY | RESPIRATORY_TRACT | Status: AC | PRN

## 2015-04-11 MED ORDER — ALBUTEROL SULFATE HFA 108 (90 BASE) MCG/ACT IN AERS
2.0000 | INHALATION_SPRAY | Freq: Once | RESPIRATORY_TRACT | Status: AC
  Administered 2015-04-11: 2 via RESPIRATORY_TRACT
  Filled 2015-04-11: qty 6.7

## 2015-04-11 MED ORDER — CETIRIZINE HCL 10 MG PO TABS: 10.0000 mg | ORAL_TABLET | Freq: Every day | ORAL | Status: AC

## 2015-04-11 NOTE — Discharge Instructions (Signed)

## 2015-04-11 NOTE — ED Notes (Signed)
Per pt, states nasal and chest congestion for 3 weeks-no relief with OTC meds

## 2015-04-11 NOTE — ED Provider Notes (Signed)
CSN: [id number]     Arrival date & time 04/11/15  1445 History  By signing my name below, I, Brandon Blake, attest that this documentation has been prepared under the direction and in the presence of  [name] PA-C.  Electronically Signed: Lyndel Blake, ED Scribe. 04/11/2015. 4:16 PM.   Chief Complaint  Patient presents with  . Nasal Congestion   The history is provided by the patient. No language interpreter was used.   HPI Comments: Brandon Blake is a 35 y.o. male, with no pertinent PMhx, who presents to the Emergency Department complaining of gradually worsening, constant cough, SOB, chest congestion, chest tightness, post nasal drip, wheezing, and nasal congestion for approximately  1 week. Pt has taken several OTC cold and decongestant medications today without relief. Pt is a current daily smoker. No history of asthma or COPD. Denies fevers or chills, otalgia, sore throat, abdominal pain, nausea, vomiting, or diarrhea.   Past Medical History  Diagnosis Date  . Back pain, chronic   . Seizures [facility])    Past Surgical History  Procedure Laterality Date  . Cholecystectomy     Family History  Problem Relation Age of Onset  . Cancer Father   . Diabetes Other   . Coronary artery disease Other    Social History  Substance Use Topics  . Smoking status: Current Every Day Smoker -- 0.50 packs/day    Types: Cigarettes  . Smokeless tobacco: None  . Alcohol Use: Yes     Comment: social    Review of Systems  Constitutional: Negative for fever and chills.  HENT: Positive for congestion and postnasal drip. Negative for sneezing and sore throat.   Respiratory: Positive for cough, chest tightness, shortness of breath and wheezing.   Cardiovascular: Negative for chest pain, palpitations and leg swelling.  Gastrointestinal: Negative for nausea, vomiting, abdominal pain and diarrhea.  Musculoskeletal: Negative for myalgias and arthralgias.  Skin: Negative for rash.   Neurological: Negative for light-headedness and headaches.   Allergies  Naproxen  Home Medications   Prior to Admission medications   Medication Sig Start Date End Date Taking? Authorizing Provider  albuterol (PROVENTIL HFA;VENTOLIN HFA) 108 (90 Base) MCG/ACT inhaler Inhale 2 puffs into the lungs every 4 (four) hours as needed for wheezing or shortness of breath. 04/11/15   [name], PA-C  Aspirin-Acetaminophen (GOODYS BODY PAIN PO) Take 2 packets by mouth every 4 (four) hours as needed (for pain).     Historical Provider, MD  benzonatate (TESSALON) 100 MG capsule Take 1 capsule (100 mg total) by mouth 3 (three) times daily as needed for cough. 04/11/15   [name], PA-C  cephALEXin (KEFLEX) 500 MG capsule Take 1 capsule (500 mg total) by mouth 2 (two) times daily. 02/07/15   Tiffany Neva Seat, PA-C  cetirizine (ZYRTEC ALLERGY) 10 MG tablet Take 1 tablet (10 mg total) by mouth daily. 04/11/15   [name], PA-C  predniSONE (DELTASONE) 20 MG tablet Take 2 tablets (40 mg total) by mouth daily. 04/11/15   [name], PA-C  sulfamethoxazole-trimethoprim (SEPTRA DS) 800-160 MG per tablet Take 2 tablets by mouth 2 (two) times daily. Patient not taking: Reported on 02/07/2015 02/28/14   Santiago Glad, PA-C  traMADol (ULTRAM) 50 MG tablet Take 1 tablet (50 mg total) by mouth every 6 (six) hours as needed. 02/07/15   Tiffany Neva Seat, PA-C   BP 126/74 mmHg  Pulse 81  Temp(Src) 98.1 F (36.7 C) (Oral)  Resp 18  SpO2 99% Physical Exam  Constitutional: He appears well-developed and well-nourished. No distress.  Nontoxic appearing.  HENT:  Head: Normocephalic and atraumatic.  Right Ear: Tympanic membrane and external ear normal.  Left Ear: Tympanic membrane and external ear normal.  Mouth/Throat: Uvula is midline and oropharynx is clear and moist. No oropharyngeal exudate, posterior oropharyngeal edema or posterior oropharyngeal erythema.  TMs normal bilaterally. No tonsillar hypertrophy  or exudate.   Eyes: Conjunctivae are normal. Pupils are equal, round, and reactive to light. Right eye exhibits no discharge. Left eye exhibits no discharge.  Neck: Normal range of motion. Neck supple. No JVD present. No tracheal deviation present.  Cardiovascular: Normal rate, regular rhythm, normal heart sounds and intact distal pulses.  Exam reveals no gallop and no friction rub.   No murmur heard. Pulmonary/Chest: Effort normal. No respiratory distress. He has wheezes. He has no rales.  Scattered wheezes bilaterally. No increased work of breathing.   Abdominal: Soft. There is no tenderness.  Musculoskeletal: He exhibits no edema.  Lymphadenopathy:    He has no cervical adenopathy.  Neurological: He is alert. Coordination normal.  Skin: Skin is warm and dry. No rash noted. He is not diaphoretic. No erythema. No pallor.  Psychiatric: He has a normal mood and affect. His behavior is normal.  Nursing note and vitals reviewed.   ED Course  Procedures  DIAGNOSTIC STUDIES: Oxygen Saturation is 99% on RA, normal by my interpretation.    COORDINATION OF CARE: 4:13 PM Discussed treatment plan which includes to order chest Xray and breathing treatment with pt. Pt acknowledges and agrees to plan.   Imaging Review Dg Chest 2 View  04/11/2015  CLINICAL DATA:  Nasal and chest congestion for 3 weeks. Cough. Wheezing. Shortness of breath. EXAM: CHEST  2 VIEW COMPARISON:  02/12/2014 FINDINGS: The heart size and mediastinal contours are within normal limits. Both lungs are clear. The visualized skeletal structures are unremarkable. IMPRESSION: No active cardiopulmonary disease. Electronically Signed   By: Gaylyn Rong M.D.   On: 04/11/2015 16:47   I have personally reviewed and evaluated these images as part of my medical decision-making.   MDM   Meds given in ED:  Medications  albuterol (PROVENTIL HFA;VENTOLIN HFA) 108 (90 Base) MCG/ACT inhaler 2 puff (not administered)  albuterol  (PROVENTIL) (2.5 MG/3ML) 0.083% nebulizer solution 5 mg (5 mg Nebulization Given 04/11/15 1622)  ipratropium (ATROVENT) nebulizer solution 0.5 mg (0.5 mg Nebulization Given 04/11/15 1622)    New Prescriptions   ALBUTEROL (PROVENTIL HFA;VENTOLIN HFA) 108 (90 BASE) MCG/ACT INHALER    Inhale 2 puffs into the lungs every 4 (four) hours as needed for wheezing or shortness of breath.   BENZONATATE (TESSALON) 100 MG CAPSULE    Take 1 capsule (100 mg total) by mouth 3 (three) times daily as needed for cough.   CETIRIZINE (ZYRTEC ALLERGY) 10 MG TABLET    Take 1 tablet (10 mg total) by mouth daily.   PREDNISONE (DELTASONE) 20 MG TABLET    Take 2 tablets (40 mg total) by mouth daily.    Final diagnoses:  Acute bronchitis, unspecified organism   This  is a 35 y.o. male, with no pertinent PMhx, who presents to the Emergency Department complaining of gradually worsening, constant cough, SOB, chest congestion, chest tightness, post nasal drip, wheezing, and nasal congestion for approximately  1 week.  On exam the patient is afebrile and nontoxic appearing. He has scattered wheezes bilaterally. No rales. No increased work of breathing. Will provide with breathing treatment.  At reevaluation patient  reports feeling much better and does not complain of shortness of breath. Chest x-ray is unremarkable. No pneumonia. Patient with bronchitis. We'll discharge the patient with prednisone, albuterol inhaler, Tessalon Perles, and zyrtec. I discussed strict return precautions. I advised the patient to follow-up with their primary care provider this week. I advised the patient to return to the emergency department with new or worsening symptoms or new concerns. The patient verbalized understanding and agreement with plan.    I personally performed the services described in this documentation, which was scribed in my presence. The recorded information has been reviewed and is accurate.       Everlene FarrierWilliam Artist Bloom, PA-C 04/11/15  1708  Marily MemosJason Mesner, MD 04/12/15 2312

## 2015-08-25 IMAGING — CR DG WRIST COMPLETE 3+V*L*
4 series · 4 of 4 positions shown · non-contrast
Comparison: None.

CLINICAL DATA: Insect bite to wrist.  Wrist pain and swelling.

EXAM:
LEFT WRIST - COMPLETE 3+ VIEW

[x wrist pa left]
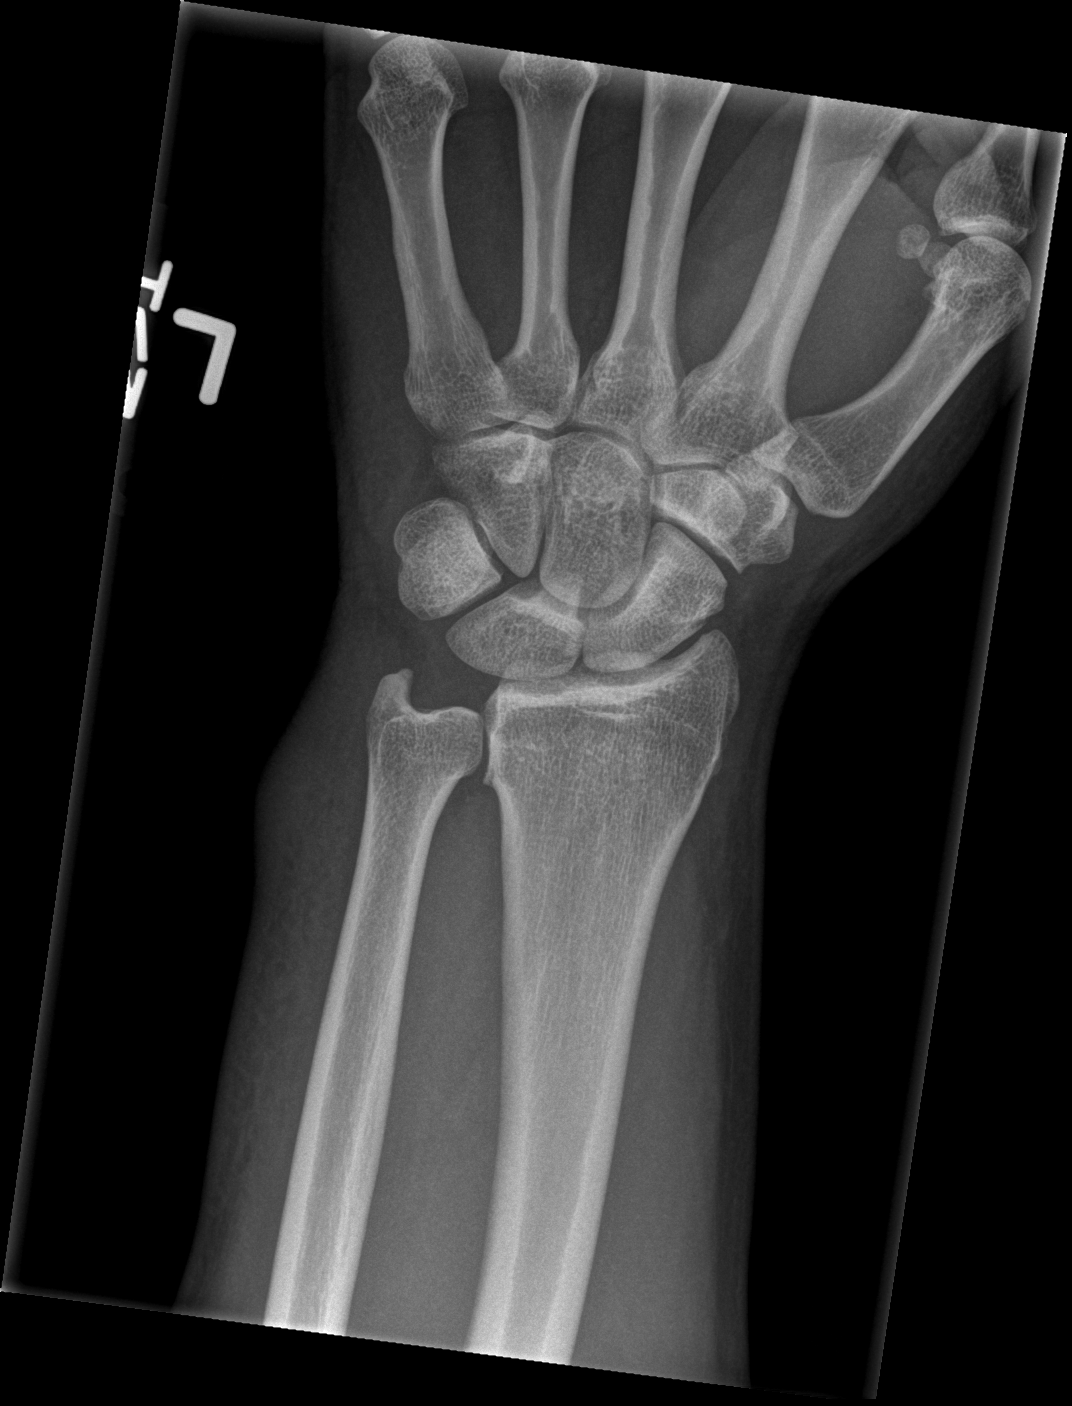

[x wrist obl left]
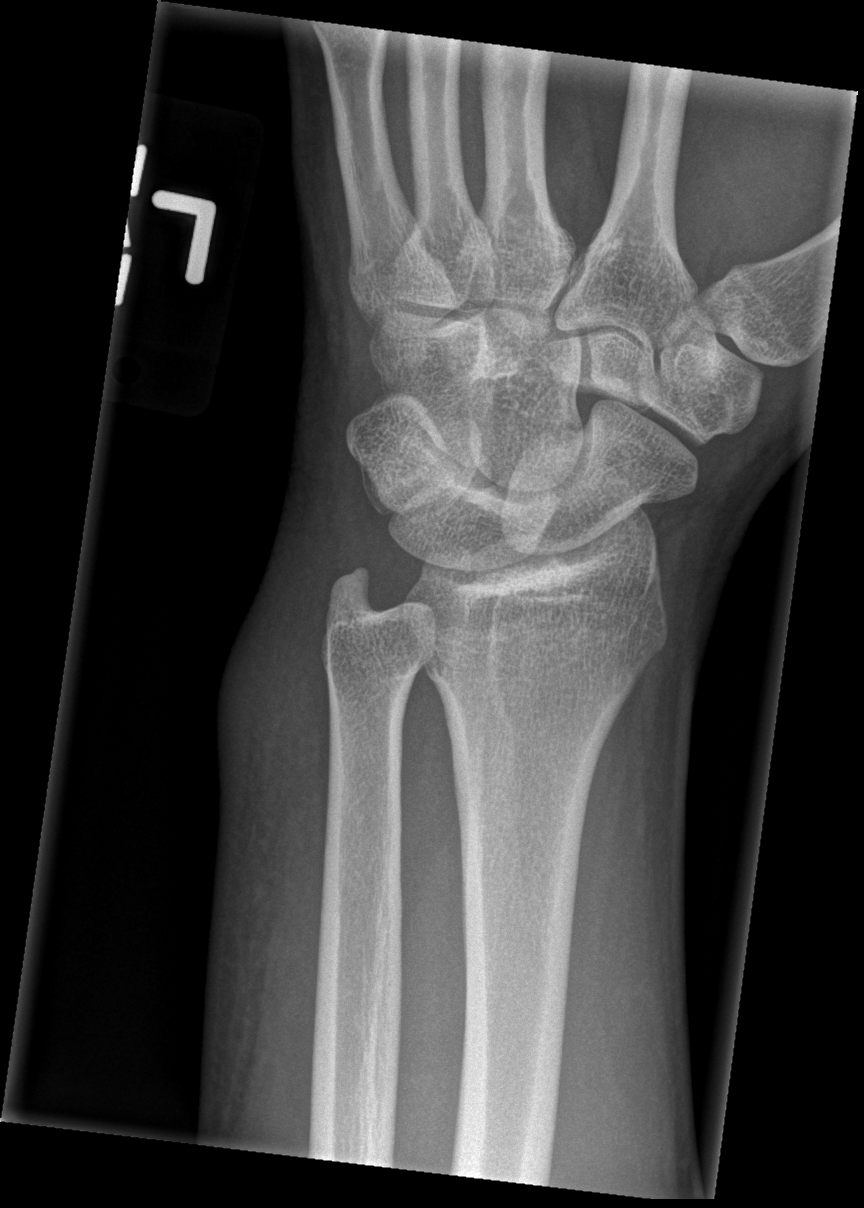

[x wrist lat left]
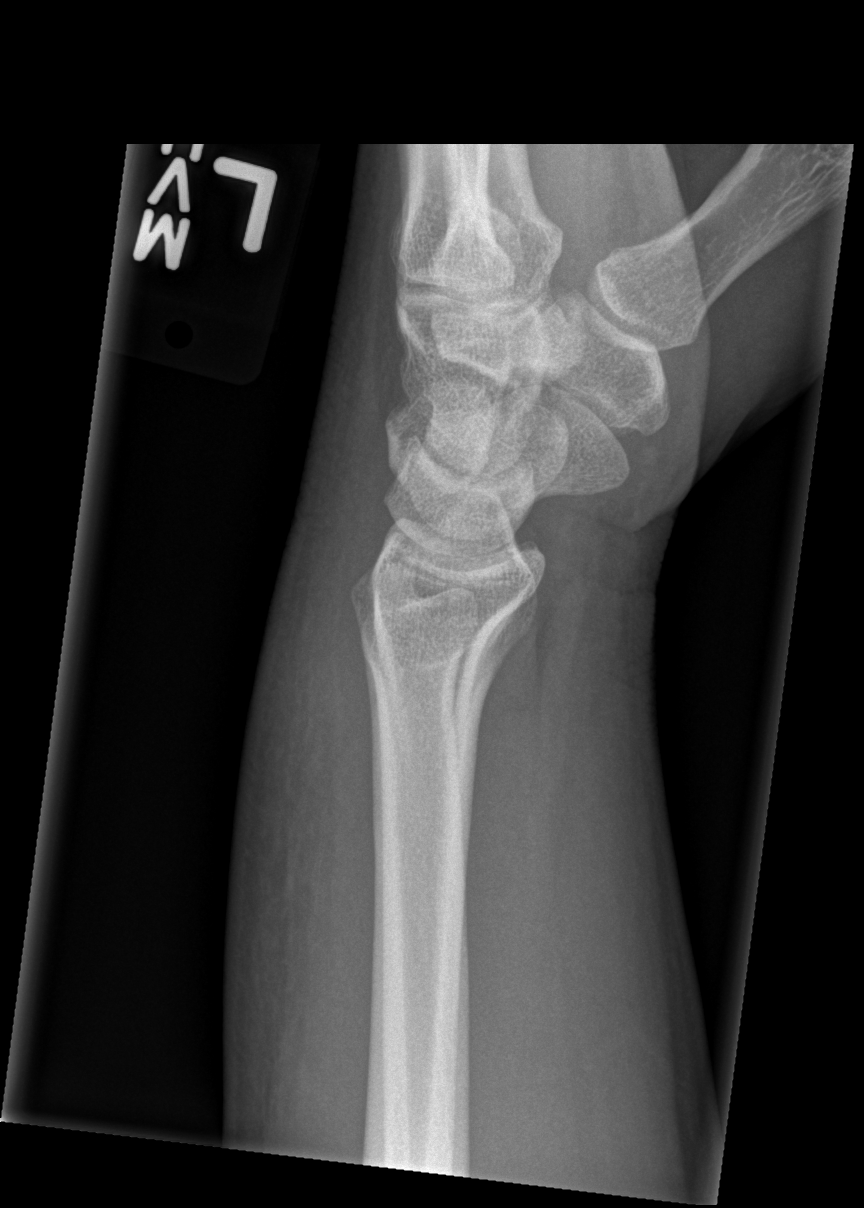

[x wrist navicular view left]
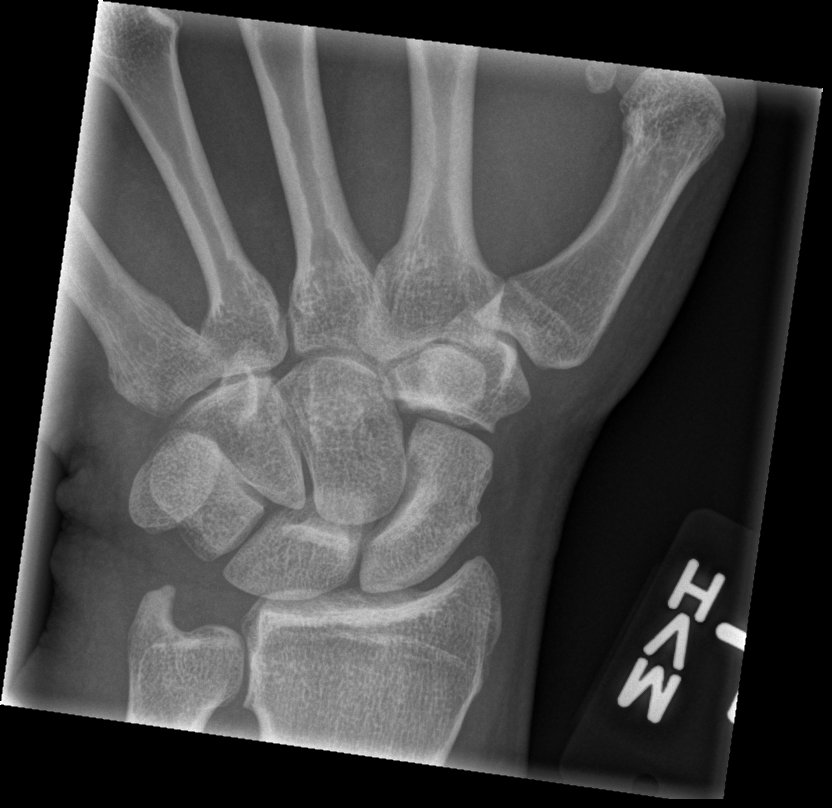

[4 of 4 positions shown; findings below may reference images not displayed]

FINDINGS: There is no evidence of fracture or dislocation. There is no
evidence of arthropathy or other focal bone abnormality. Soft tissue
swelling is seen overlying the distal ulna. No soft tissue gas or
radiopaque foreign body identified.
IMPRESSION: Soft tissue swelling overlying distal ulna.  No osseous abnormality.

## 2019-08-12 ENCOUNTER — Other Ambulatory Visit: Payer: Self-pay

## 2019-08-12 ENCOUNTER — Encounter: Payer: Self-pay | Admitting: Emergency Medicine

## 2019-08-12 ENCOUNTER — Emergency Department
Admission: EM | Admit: 2019-08-12 | Discharge: 2019-08-12 | Disposition: A | Discharge status: Home / Self Care | Payer: BLUE CROSS/BLUE SHIELD | Attending: Emergency Medicine | Admitting: Emergency Medicine

## 2019-08-12 DIAGNOSIS — R21 Rash and other nonspecific skin eruption: Secondary | ICD-10-CM

## 2019-08-12 DIAGNOSIS — Z79899 Other long term (current) drug therapy: Secondary | ICD-10-CM | POA: Insufficient documentation

## 2019-08-12 DIAGNOSIS — Z9049 Acquired absence of other specified parts of digestive tract: Secondary | ICD-10-CM | POA: Insufficient documentation

## 2019-08-12 DIAGNOSIS — F17210 Nicotine dependence, cigarettes, uncomplicated: Secondary | ICD-10-CM | POA: Insufficient documentation

## 2019-08-12 MED ORDER — HYDROXYZINE HCL 50 MG PO TABS: 50.0000 mg | ORAL_TABLET | Freq: Three times a day (TID) | ORAL | 0 refills | Status: DC | PRN

## 2019-08-12 MED ORDER — SODIUM CHLORIDE 0.9 % IV BOLUS
500.0000 mL | Freq: Once | INTRAVENOUS | Status: AC
  Administered 2019-08-12: 17:00:00 500 mL via INTRAVENOUS

## 2019-08-12 MED ORDER — METHYLPREDNISOLONE SODIUM SUCC 125 MG IJ SOLR
125.0000 mg | Freq: Once | INTRAMUSCULAR | Status: AC
  Administered 2019-08-12: 17:00:00 125 mg via INTRAMUSCULAR
  Filled 2019-08-12: qty 2

## 2019-08-12 MED ORDER — METOCLOPRAMIDE HCL 5 MG/ML IJ SOLN
20.0000 mg | Freq: Once | INTRAVENOUS | Status: AC
  Administered 2019-08-12: 19:00:00 20 mg via INTRAVENOUS

## 2019-08-12 MED ORDER — DIPHENHYDRAMINE HCL 50 MG/ML IJ SOLN
25.0000 mg | Freq: Once | INTRAMUSCULAR | Status: AC
  Administered 2019-08-12: 25 mg via INTRAVENOUS
  Filled 2019-08-12: qty 1

## 2019-08-12 MED ORDER — TRAMADOL HCL 50 MG PO TABS
50.0000 mg | ORAL_TABLET | Freq: Once | ORAL | Status: AC
  Administered 2019-08-12: 50 mg via ORAL
  Filled 2019-08-12: qty 1

## 2019-08-12 MED ORDER — METOCLOPRAMIDE HCL 5 MG/ML IJ SOLN
20.0000 mg | Freq: Once | INTRAVENOUS | Status: DC
  Filled 2019-08-12: qty 4

## 2019-08-12 MED ORDER — METHYLPREDNISOLONE 4 MG PO TBPK: ORAL_TABLET | ORAL | 0 refills | Status: DC

## 2019-08-12 MED ORDER — TRAMADOL HCL 50 MG PO TABS: 50.0000 mg | ORAL_TABLET | Freq: Four times a day (QID) | ORAL | 0 refills | Status: DC | PRN

## 2019-08-12 NOTE — ED Notes (Signed)
Electronic pad failure- verbal consent obtained for discharge.

## 2019-08-12 NOTE — ED Provider Notes (Signed)
[facility] Emergency Department Provider Note   ____________________________________________   First MD Initiated Contact with Patient 08/12/19 1648     (approximate)  I have reviewed the triage vital signs and the nursing notes.   HISTORY  Chief Complaint Rash    HPI Brandon Blake is a 39 y.o. male patient complaining of rash to bilateral hand and face for 2 weeks.  Patient has a burning sensation associated with the rash.  Patient is taking over-the-counter Allegra and Benadryl without relief.  Patient rates his pain discomfort a 3/10.  Patient states similar rash a few years ago and resolved after change in laundry products.  Patient unsure of any new laundry products.            Past Medical History:  Diagnosis Date  . Back pain, chronic   . Seizures [facility])     Patient Active Problem List   Diagnosis Date Noted  . Acute respiratory failure with hypoxia ([place]) 11/17/2012  . Aspiration pneumonia ([place]) 11/17/2012  . Status epilepticus ([place]) 10/20/2012  . Chronic back pain 10/20/2012  . Leukocytosis 10/20/2012  . Seizures ([place]) 10/14/2012  . Hypokalemia 10/14/2012  . Noncompliance 10/14/2012  . 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 CHOLECYSTECTOMY      Prior to Admission medications   Medication Sig Start Date End Date Taking? Authorizing Provider  albuterol (PROVENTIL HFA;VENTOLIN HFA) 108 (90 Base) MCG/ACT inhaler Inhale 2 puffs into the lungs every 4 (four) hours as needed for wheezing or shortness of breath. 04/11/15   Waynetta Pean, PA-C  Aspirin-Acetaminophen (GOODYS BODY PAIN PO) Take 2 packets by mouth every 4 (four) hours as needed (for pain).     [provider]  benzonatate (TESSALON) 100 MG capsule Take 1 capsule (100 mg total) by mouth 3 (three) times daily as needed for cough. 04/11/15   Waynetta Pean, PA-C  cephALEXin (KEFLEX) 500 MG capsule  Take 1 capsule (500 mg total) by mouth 2 (two) times daily. 02/07/15   Delos Haring, PA-C  cetirizine (ZYRTEC ALLERGY) 10 MG tablet Take 1 tablet (10 mg total) by mouth daily. 04/11/15   Waynetta Pean, PA-C  hydrOXYzine (ATARAX/VISTARIL) 50 MG tablet Take 1 tablet (50 mg total) by mouth 3 (three) times daily as needed. 08/12/19   Sable Feil, PA-C  methylPREDNISolone (MEDROL DOSEPAK) 4 MG TBPK tablet Take Tapered dose as directed 08/12/19   Sable Feil, PA-C  predniSONE (DELTASONE) 20 MG tablet Take 2 tablets (40 mg total) by mouth daily. 04/11/15   Waynetta Pean, PA-C  sulfamethoxazole-trimethoprim (SEPTRA DS) 800-160 MG per tablet Take 2 tablets by mouth 2 (two) times daily. Patient not taking: Reported on 02/07/2015 02/28/14   Hyman Bible, PA-C  traMADol (ULTRAM) 50 MG tablet Take 1 tablet (50 mg total) by mouth every 6 (six) hours as needed. 02/07/15   Delos Haring, PA-C  traMADol (ULTRAM) 50 MG tablet Take 1 tablet (50 mg total) by mouth every 6 (six) hours as needed for moderate pain. 08/12/19   Sable Feil, PA-C    Allergies Naproxen  Family History  Problem Relation Age of Onset  . Cancer Father   . Diabetes Other   . Coronary artery disease Other     Social History Social History   Tobacco Use  . Smoking status: Current Every Day Smoker    Packs/day: 0.50    Types: Cigarettes  . Smokeless tobacco: Never  Used  Substance Use Topics  . Alcohol use: Yes    Comment: social  . Drug use: No    Review of Systems Constitutional: No fever/chills Eyes: No visual changes. ENT: No sore throat. Cardiovascular: Denies chest pain. Respiratory: Denies shortness of breath. Gastrointestinal: No abdominal pain.  No nausea, no vomiting.  No diarrhea.  No constipation. Genitourinary: Negative for dysuria. Musculoskeletal: Negative for back pain. Skin: Positive for rash. Neurological: Negative for headaches, focal weakness or numbness. Allergic/Immunilogical: Naproxen  ____________________________________________   PHYSICAL EXAM:  VITAL SIGNS: ED Triage Vitals  Enc Vitals Group     BP 08/12/19 1551 128/84     Pulse Rate 08/12/19 1551 84     Resp 08/12/19 1551 20     Temp 08/12/19 1551 98.3 F (36.8 C)     Temp Source 08/12/19 1551 Oral     SpO2 08/12/19 1551 97 %     Weight 08/12/19 1551 275 lb (124.7 kg)     Height 08/12/19 1551 6' (1.829 m)     Head Circumference --      Peak Flow --      Pain Score 08/12/19 1556 3     Pain Loc --      Pain Edu? --      Excl. in GC? --     Constitutional: Alert and oriented. Well appearing and in no acute distress. Cardiovascular: Normal rate, regular rhythm. Grossly normal heart sounds.  Good peripheral circulation. Respiratory: Normal respiratory effort.  No retractions. Lungs CTAB. Musculoskeletal: No lower extremity tenderness nor edema.  No joint effusions. Neurologic:  Normal speech and language. No gross focal neurologic deficits are appreciated. No gait instability. Skin:  Skin is warm, dry and intact.  Macular rash palmar aspect bilateral upper extremities.  Soles of feet are spared. Psychiatric: Mood and affect are normal. Speech and behavior are normal.  ____________________________________________   LABS (all labs ordered are listed, but only abnormal results are displayed)  Labs Reviewed  RPR   ____________________________________________  EKG   ____________________________________________  RADIOLOGY  ED MD interpretation:    Official radiology report(s): No results found.  ____________________________________________   PROCEDURES  Procedure(s) performed (including Critical Care):  Procedures   ____________________________________________   INITIAL IMPRESSION / ASSESSMENT AND PLAN / ED COURSE  As part of my medical decision making, I reviewed the following data within the electronic MEDICAL RECORD NUMBER     Patient presents with macular rash to the palmar aspect of  the upper extremities.  Differential consist of contact dermatitis and syphilis.  Patient is responding well  to IV steroids and antihistamines.  Patient will be discharged with prescription for Atarax, Medrol Dosepak, and tramadol.  Patient will be notified telephonically of any positive lab results.      ____________________________________________   FINAL CLINICAL IMPRESSION(S) / ED DIAGNOSES  Final diagnoses:  Rash and nonspecific skin eruption     ED Discharge Orders         Ordered    methylPREDNISolone (MEDROL DOSEPAK) 4 MG TBPK tablet     08/12/19 1746    hydrOXYzine (ATARAX/VISTARIL) 50 MG tablet  3 times daily PRN     08/12/19 1746    traMADol (ULTRAM) 50 MG tablet  Every 6 hours PRN     08/12/19 1746           Note:  This document was prepared using Dragon voice recognition software and may include unintentional dictation errors.    Joni Reining, PA-C 08/12/19  8115    Minna Antis, MD 08/12/19 2029

## 2019-08-12 NOTE — Discharge Instructions (Addendum)
Follow discharge care instructions take medication as directed.  You will be notified telephonically of any positive lab results.

## 2019-08-12 NOTE — ED Triage Notes (Signed)
Pt presents to ED via POV with c/o rash to bilateral hands and neck. Pt states burning sensation with rash. Pt states hx of same, pt states has been taking allegra and benadryl without relief. Pt states has had burning sensation and rash x 2 weeks. Mild rash noted to bilateral hands at this time.

## 2019-08-13 LAB — RPR: RPR Ser Ql: NONREACTIVE

## 2020-02-01 ENCOUNTER — Other Ambulatory Visit: Payer: Self-pay

## 2020-02-01 ENCOUNTER — Encounter: Payer: Self-pay | Admitting: Intensive Care

## 2020-02-01 ENCOUNTER — Emergency Department
Admission: EM | Admit: 2020-02-01 | Discharge: 2020-02-01 | Disposition: A | Discharge status: Home / Self Care | Payer: Self-pay | Attending: Emergency Medicine | Admitting: Emergency Medicine

## 2020-02-01 DIAGNOSIS — X58XXXA Exposure to other specified factors, initial encounter: Secondary | ICD-10-CM | POA: Insufficient documentation

## 2020-02-01 DIAGNOSIS — Y9269 Other specified industrial and construction area as the place of occurrence of the external cause: Secondary | ICD-10-CM | POA: Insufficient documentation

## 2020-02-01 DIAGNOSIS — F17210 Nicotine dependence, cigarettes, uncomplicated: Secondary | ICD-10-CM | POA: Insufficient documentation

## 2020-02-01 DIAGNOSIS — S39012A Strain of muscle, fascia and tendon of lower back, initial encounter: Secondary | ICD-10-CM | POA: Insufficient documentation

## 2020-02-01 MED ORDER — CYCLOBENZAPRINE HCL 10 MG PO TABS
10.0000 mg | ORAL_TABLET | Freq: Once | ORAL | Status: AC
  Administered 2020-02-01: 10 mg via ORAL
  Filled 2020-02-01: qty 1

## 2020-02-01 MED ORDER — KETOROLAC TROMETHAMINE 10 MG PO TABS: 10.0000 mg | ORAL_TABLET | Freq: Three times a day (TID) | ORAL | 0 refills | Status: DC

## 2020-02-01 MED ORDER — KETOROLAC TROMETHAMINE 30 MG/ML IJ SOLN
30.0000 mg | Freq: Once | INTRAMUSCULAR | Status: AC
  Administered 2020-02-01: 30 mg via INTRAMUSCULAR
  Filled 2020-02-01: qty 1

## 2020-02-01 MED ORDER — CYCLOBENZAPRINE HCL 5 MG PO TABS: 5.0000 mg | ORAL_TABLET | Freq: Three times a day (TID) | ORAL | 0 refills | Status: DC | PRN

## 2020-02-01 MED ORDER — LIDOCAINE 5 % EX PTCH: 1.0000 | MEDICATED_PATCH | Freq: Two times a day (BID) | CUTANEOUS | 0 refills | Status: AC | PRN

## 2020-02-01 NOTE — ED Provider Notes (Signed)
[facility] Emergency Department Provider Note ____________________________________________  Time seen: 1818  I have reviewed the triage vital signs and the nursing notes.  HISTORY  Chief Complaint  Back Pain   HPI Brandon Blake is a 39 y.o. male presents to the ED for what he describes as a mechanical injury while at work 2 days earlier.  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  He denies any chest pain, shortness of breath, bladder/bowel incontinence, or distal paresthesias, but he does endorse a history of chronic ongoing back pain.   Past Medical History:  Diagnosis Date  . Back pain, chronic   . Seizures [facility])     Patient Active Problem List   Diagnosis Date Noted  . Acute respiratory failure with hypoxia ([facility]) 11/17/2012  . Aspiration pneumonia ([facility]) 11/17/2012  . Status epilepticus ([facility]) 10/20/2012  . Chronic back pain 10/20/2012  . Leukocytosis 10/20/2012  . Seizures ([facility]) 10/14/2012  . Hypokalemia 10/14/2012  . Noncompliance 10/14/2012  . 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 CHOLECYSTECTOMY      Prior to Admission medications   Medication Sig Start Date End Date Taking? Authorizing Provider  albuterol (PROVENTIL HFA;VENTOLIN HFA) 108 (90 Base) MCG/ACT inhaler Inhale 2 puffs into the lungs every 4 (four) hours as needed for wheezing or shortness of breath. 04/11/15   [name], PA-C  Aspirin-Acetaminophen (GOODYS BODY PAIN PO) Take 2 packets by mouth every 4 (four) hours as needed (for pain).     [provider]  cetirizine (ZYRTEC ALLERGY) 10 MG tablet Take 1 tablet (10 mg total) by mouth daily. 04/11/15   [name], PA-C  cyclobenzaprine (FLEXERIL) 5 MG tablet Take 1 tablet (5 mg total) by mouth 3 (three) times daily as needed. 02/01/20   Janzen Sacks, Charlesetta Ivory, PA-C  ketorolac (TORADOL) 10 MG tablet  Take 1 tablet (10 mg total) by mouth every 8 (eight) hours. 02/01/20   Cotton Beckley, Charlesetta Ivory, PA-C  lidocaine (LIDODERM) 5 % Place 1 patch onto the skin every 12 (twelve) hours as needed for up to 10 days. Remove & Discard patch after 12 hours of wear each day. 02/01/20 02/11/20  Dany Walther, Charlesetta Ivory, PA-C    Allergies Naproxen  Family History  Problem Relation Age of Onset  . Cancer Father   . Diabetes Other   . Coronary artery disease Other     Social History Social History   Tobacco Use  . Smoking status: Current Every Day Smoker    Packs/day: 0.50    Types: Cigarettes  . Smokeless tobacco: Never Used  Substance Use Topics  . Alcohol use: Yes    Comment: social  . Drug use: No    Review of Systems  Constitutional: Negative for fever. Cardiovascular: Negative for chest pain. Respiratory: Negative for shortness of breath. Gastrointestinal: Negative for abdominal pain, vomiting and diarrhea. Genitourinary: Negative for dysuria. Musculoskeletal: Positive for back pain. Skin: Negative for rash. Neurological: Negative for headaches, focal weakness or numbness. ____________________________________________  PHYSICAL EXAM:  VITAL SIGNS: ED Triage Vitals [02/01/20 1752]  Enc Vitals Group     BP 128/78     Pulse Rate 78     Resp 16     Temp 99.2 F (37.3 C)     Temp Source Oral     SpO2 99 %     Weight 285 lb (129.3 kg)  Height 6' (1.829 m)     Head Circumference      Peak Flow      Pain Score 7     Pain Loc      Pain Edu?      Excl. in GC?     Constitutional: Alert and oriented. Well appearing and in no distress. Head: Normocephalic and atraumatic. Eyes: Conjunctivae are normal. Normal extraocular movements Cardiovascular: Normal rate, regular rhythm. Normal distal pulses. Respiratory: Normal respiratory effort. No wheezes/rales/rhonchi. Gastrointestinal: Soft, protuberant, and nontender. No distention. Musculoskeletal: Normal spinal alignment  without significant midline tenderness, spasm, vomiting, or step-off.  Patient is however tender to light touch over the skin of the bilateral paraspinals right slightly greater than left.  He is able demonstrate normal lumbar flexion and extension range.  Nontender with normal range of motion in all extremities.  Neurologic:  Normal gait without ataxia. Normal speech and language. No gross focal neurologic deficits are appreciated. Skin:  Skin is warm, dry and intact. No rash noted. ____________________________________________   RADIOLOGY  Not indicated. ____________________________________________  PROCEDURES  Toradol 30 mg IM Flexeril 10 gm PO  Procedures ____________________________________________  INITIAL IMPRESSION / ASSESSMENT AND PLAN / ED COURSE  Patient with ED evaluation of mechanical back pain following work activities.  Exam is overall benign reassuring without any red flags.  No neuro muscle deficit on exam.  Normal active range of motion of the lumbar spine.  Patient will be treated empirically with anti-inflammatories and muscle relaxants.  He is advised to follow-up with his primary provider local community clinic for ongoing symptoms.  Work-up provided for 1 day as is requested.  Brandon Blake was evaluated in Emergency Department on 02/01/2020 for the symptoms described in the history of present illness. He was evaluated in the context of the global COVID-19 pandemic, which necessitated consideration that the patient might be at risk for infection with the SARS-CoV-2 virus that causes COVID-19. Institutional protocols and algorithms that pertain to the evaluation of patients at risk for COVID-19 are in a state of rapid change based on information released by regulatory bodies including the CDC and federal and state organizations. These policies and algorithms were followed during the patient's care in the ED. ____________________________________________  FINAL  CLINICAL IMPRESSION(S) / ED DIAGNOSES  Final diagnoses:  Strain of lumbar region, initial encounter      Lissa Hoard, PA-C 02/01/20 1904    Dionne Bucy, MD 02/01/20 [phone]

## 2020-02-01 NOTE — ED Triage Notes (Signed)
Pt c/o lower to mid back pain that started two days ago. Denies injury. 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 Able to ambulate with no problems

## 2020-02-01 NOTE — Discharge Instructions (Signed)
Your exam is consistent with a muscle strain.  There is no signs of a herniated disc or serious fracture to the back.  Take the prescription medications as prescribed.  Follow-up with your primary provider return to the ED if needed.

## 2020-02-23 ENCOUNTER — Other Ambulatory Visit: Payer: Self-pay

## 2020-02-23 ENCOUNTER — Ambulatory Visit (INDEPENDENT_AMBULATORY_CARE_PROVIDER_SITE_OTHER): Payer: No Payment, Other | Admitting: Clinical

## 2020-02-23 DIAGNOSIS — F6381 Intermittent explosive disorder: Secondary | ICD-10-CM

## 2020-02-23 DIAGNOSIS — F4310 Post-traumatic stress disorder, unspecified: Secondary | ICD-10-CM

## 2020-02-27 DIAGNOSIS — F4310 Post-traumatic stress disorder, unspecified: Secondary | ICD-10-CM | POA: Insufficient documentation

## 2020-02-27 DIAGNOSIS — F6381 Intermittent explosive disorder: Secondary | ICD-10-CM | POA: Insufficient documentation
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
## 2020-03-15 ENCOUNTER — Other Ambulatory Visit: Payer: Self-pay

## 2020-03-15 ENCOUNTER — Ambulatory Visit (INDEPENDENT_AMBULATORY_CARE_PROVIDER_SITE_OTHER): Payer: No Payment, Other | Admitting: Clinical

## 2020-03-15 DIAGNOSIS — F6381 Intermittent explosive disorder: Secondary | ICD-10-CM
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
## 2020-05-07 ENCOUNTER — Ambulatory Visit (INDEPENDENT_AMBULATORY_CARE_PROVIDER_SITE_OTHER): Payer: No Payment, Other | Admitting: Psychiatry

## 2020-05-07 ENCOUNTER — Other Ambulatory Visit: Payer: Self-pay

## 2020-05-07 ENCOUNTER — Ambulatory Visit (INDEPENDENT_AMBULATORY_CARE_PROVIDER_SITE_OTHER): Payer: No Payment, Other | Admitting: Clinical

## 2020-05-07 ENCOUNTER — Encounter (HOSPITAL_COMMUNITY): Payer: Self-pay | Admitting: Psychiatry

## 2020-05-07 ENCOUNTER — Other Ambulatory Visit (HOSPITAL_COMMUNITY): Payer: Self-pay | Admitting: Psychiatry

## 2020-05-07 ENCOUNTER — Ambulatory Visit (HOSPITAL_COMMUNITY): Payer: No Payment, Other | Admitting: Physician Assistant

## 2020-05-07 DIAGNOSIS — F331 Major depressive disorder, recurrent, moderate: Secondary | ICD-10-CM

## 2020-05-07 DIAGNOSIS — F6381 Intermittent explosive disorder: Secondary | ICD-10-CM | POA: Diagnosis not present

## 2020-05-07 MED ORDER — FLUOXETINE HCL 10 MG PO CAPS: 10.0000 mg | ORAL_CAPSULE | Freq: Every day | ORAL | 1 refills | Status: DC

## 2020-05-07 MED ORDER — QUETIAPINE FUMARATE 50 MG PO TABS
50.0000 mg | ORAL_TABLET | Freq: Every day | ORAL | 1 refills | Status: DC
Start: 2020-05-07 — End: 2020-05-07

## 2020-05-07 MED FILL — FLUoxetine HCL 10 MG CAPS: 10 | 30 days supply | Qty: 30 | Fill #0

## 2020-05-07 MED FILL — QUETIAPINE FUMARATE 50 MG T: 50 | 30 days supply | Qty: 30 | Fill #0
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
## 2020-05-21 ENCOUNTER — Other Ambulatory Visit: Payer: Self-pay

## 2020-05-21 ENCOUNTER — Ambulatory Visit (INDEPENDENT_AMBULATORY_CARE_PROVIDER_SITE_OTHER): Payer: No Payment, Other | Admitting: Clinical

## 2020-05-21 DIAGNOSIS — F331 Major depressive disorder, recurrent, moderate: Secondary | ICD-10-CM | POA: Diagnosis not present
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
## 2020-06-11 MED FILL — FLUoxetine HCL 10 MG CAPS: 10 | 30 days supply | Qty: 30 | Fill #1

## 2020-06-19 ENCOUNTER — Ambulatory Visit (HOSPITAL_COMMUNITY): Payer: No Payment, Other | Admitting: Psychiatry

## 2020-06-25 MED FILL — QUETIAPINE FUMARATE 50 MG T: 50 | 30 days supply | Qty: 30 | Fill #1

## 2020-06-25 MED FILL — FLUoxetine HCL 10 MG CAPS: 10 | 30 days supply | Qty: 30 | Fill #1

## 2020-06-26 ENCOUNTER — Ambulatory Visit (HOSPITAL_COMMUNITY): Payer: No Payment, Other | Admitting: Clinical

## 2020-06-27 ENCOUNTER — Ambulatory Visit (HOSPITAL_COMMUNITY): Payer: No Payment, Other | Admitting: Psychiatry

## 2020-07-04 ENCOUNTER — Ambulatory Visit (HOSPITAL_COMMUNITY): Payer: No Payment, Other | Admitting: Psychiatry

## 2020-07-15 ENCOUNTER — Ambulatory Visit (HOSPITAL_COMMUNITY): Payer: Self-pay | Admitting: Clinical

## 2020-08-09 ENCOUNTER — Ambulatory Visit (HOSPITAL_COMMUNITY): Payer: No Payment, Other | Admitting: Psychiatry

## 2020-08-20 ENCOUNTER — Ambulatory Visit (INDEPENDENT_AMBULATORY_CARE_PROVIDER_SITE_OTHER): Payer: No Payment, Other | Admitting: Clinical

## 2020-08-20 ENCOUNTER — Other Ambulatory Visit: Payer: Self-pay

## 2020-08-20 DIAGNOSIS — F331 Major depressive disorder, recurrent, moderate: Secondary | ICD-10-CM | POA: Diagnosis not present
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
## 2020-08-24 ENCOUNTER — Other Ambulatory Visit: Payer: Self-pay

## 2020-08-24 ENCOUNTER — Emergency Department
Admission: EM | Admit: 2020-08-24 | Discharge: 2020-08-24 | Disposition: A | Discharge status: Home / Self Care | Payer: Self-pay | Attending: Emergency Medicine | Admitting: Emergency Medicine

## 2020-08-24 DIAGNOSIS — Z7982 Long term (current) use of aspirin: Secondary | ICD-10-CM | POA: Insufficient documentation

## 2020-08-24 DIAGNOSIS — T7840XA Allergy, unspecified, initial encounter: Secondary | ICD-10-CM | POA: Insufficient documentation

## 2020-08-24 DIAGNOSIS — F17210 Nicotine dependence, cigarettes, uncomplicated: Secondary | ICD-10-CM | POA: Insufficient documentation

## 2020-08-24 MED ORDER — FAMOTIDINE IN NACL 20-0.9 MG/50ML-% IV SOLN
20.0000 mg | Freq: Once | INTRAVENOUS | Status: AC
  Administered 2020-08-24: 20 mg via INTRAVENOUS
  Filled 2020-08-24: qty 50

## 2020-08-24 MED ORDER — PREDNISONE 20 MG PO TABS
40.0000 mg | ORAL_TABLET | Freq: Every day | ORAL | 0 refills | Status: AC
Start: 2020-08-24 — End: 2020-08-29

## 2020-08-24 MED ORDER — METHYLPREDNISOLONE SODIUM SUCC 125 MG IJ SOLR
125.0000 mg | Freq: Once | INTRAMUSCULAR | Status: AC
Start: 2020-08-24 — End: 2020-08-24
  Administered 2020-08-24: 125 mg via INTRAVENOUS
  Filled 2020-08-24: qty 2

## 2020-08-24 MED ORDER — DIPHENHYDRAMINE HCL 50 MG/ML IJ SOLN
50.0000 mg | Freq: Once | INTRAMUSCULAR | Status: AC
  Administered 2020-08-24: 50 mg via INTRAVENOUS
  Filled 2020-08-24: qty 1

## 2020-08-24 MED ORDER — SODIUM CHLORIDE 0.9 % IV BOLUS
1000.0000 mL | Freq: Once | INTRAVENOUS | Status: AC
  Administered 2020-08-24: 1000 mL via INTRAVENOUS

## 2020-08-24 NOTE — ED Triage Notes (Signed)
Pt presents with oral swelling and hives. Reports symptoms began yesterday. No improvement with benadryl taken around 1300 yesterday. Airway patent. No difficulty breathing.

## 2020-08-24 NOTE — ED Notes (Signed)
Pt reports allergic reaction to unknown substance since yesterday, reports his "lips started to hurt" and took PO benadryl around 1pm yesterday. Pt reports going to sleep and waking up around 2am noticing full body hives and increased swelling. Airway patent, able to speak in full sentences and manage secretions. Denies DIB/SOB. Reports only known allergen in the past was naproxen, denies taking any recently.

## 2020-08-24 NOTE — ED Provider Notes (Signed)
[facility] Emergency Department Provider Note  Time seen: 4:24 AM  I have reviewed the triage vital signs and the nursing notes.   HISTORY  Chief Complaint Allergic Reaction   HPI Brandon Blake is a 40 y.o. male presents to the emergency department for likely allergic reaction.  According to the patient  yesterday he noticed some double pressure around his lips, today he noticed some discomfort and swelling as well as some itching to his face.  States his lip is not swollen as well as mild swelling around his eyes.  Patient states he has an allergy to NSAIDs but has not had any of those recently.  Denies any other known allergies.  Denies any known new foods or exposures.  Past Medical History:  Diagnosis Date  . Back pain, chronic   . Seizures [facility])     Patient Active Problem List   Diagnosis Date Noted  . MDD (major depressive disorder), recurrent episode, moderate (HCC) 05/07/2020  . Intermittent explosive disorder 02/27/2020  . Posttraumatic stress disorder 02/27/2020  . Acute respiratory failure with hypoxia (HCC) 11/17/2012  . Aspiration pneumonia (HCC) 11/17/2012  . Status epilepticus (HCC) 10/20/2012  . Chronic back pain 10/20/2012  . Leukocytosis 10/20/2012  . Seizures (HCC) 10/14/2012  . Hypokalemia 10/14/2012  . Noncompliance 10/14/2012  . 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 CHOLECYSTECTOMY      Prior to Admission medications   Medication Sig Start Date End Date Taking? Authorizing Provider  albuterol (PROVENTIL HFA;VENTOLIN HFA) 108 (90 Base) MCG/ACT inhaler Inhale 2 puffs into the lungs every 4 (four) hours as needed for wheezing or shortness of breath. 04/11/15   [name], PA-C  Aspirin-Acetaminophen (GOODYS BODY PAIN PO) Take 2 packets by mouth every 4 (four) hours as needed (for pain).     [provider]  cetirizine (ZYRTEC ALLERGY) 10 MG tablet  Take 1 tablet (10 mg total) by mouth daily. 04/11/15   [name], PA-C  cyclobenzaprine (FLEXERIL) 5 MG tablet Take 1 tablet (5 mg total) by mouth 3 (three) times daily as needed. 02/01/20   Menshew, Charlesetta Ivory, PA-C  FLUoxetine (PROZAC) 10 MG capsule TAKE 1 CAPSULE BY MOUTH DAILY 05/07/20 05/07/21  Zena Amos, MD  QUEtiapine (SEROQUEL) 50 MG tablet TAKE 1 TABLET (50 MG TOTAL) BY MOUTH AT BEDTIME. 05/07/20 05/07/21  Zena Amos, MD    Allergies  Allergen Reactions  . Naproxen Hives, Swelling and Rash    Swelling of hands     Family History  Problem Relation Age of Onset  . Cancer Father   . Diabetes Other   . Coronary artery disease Other     Social History Social History   Tobacco Use  . Smoking status: Current Every Day Smoker    Packs/day: 0.50    Types: Cigarettes  . Smokeless tobacco: Never Used  Substance Use Topics  . Alcohol use: Yes    Comment: social  . Drug use: No    Review of Systems Constitutional: Negative for fever. Eyes: Mild swelling around eyes ENT: Mild lower lip swelling Cardiovascular: Negative for chest pain. Respiratory: Negative for shortness of breath. Gastrointestinal: Negative for abdominal pain.  1 episode of vomiting yesterday. Musculoskeletal: Negative for musculoskeletal complaints Skin: Some itching. Neurological: Negative for headache All other ROS negative  ____________________________________________   PHYSICAL EXAM:  VITAL SIGNS: ED Triage Vitals [08/24/20 0348]  Enc Vitals Group  BP 124/81     Pulse Rate 87     Resp 18     Temp 99.8 F (37.7 C)     Temp Source Oral     SpO2 95 %     Weight 275 lb (124.7 kg)     Height 6' (1.829 m)     Head Circumference      Peak Flow      Pain Score 0     Pain Loc      Pain Edu?      Excl. in GC?     Constitutional: Alert and oriented. Well appearing and in no distress. Eyes: Mild periorbital edema ENT      Head: Normocephalic and atraumatic.      Mouth/Throat:  Mucous membranes are moist.  Mild lower lip swelling.  No intraoral edema noted.  Patent oropharynx. Cardiovascular: Normal rate, regular rhythm. Respiratory: Normal respiratory effort without tachypnea nor retractions. Breath sounds are clear without wheeze Gastrointestinal: Soft and nontender. No distention.   Musculoskeletal: Nontender with normal range of motion in all extremities.  Neurologic:  Normal speech and language. No gross focal neurologic deficits  Skin:  Skin is warm, dry.  Mild erythema around the face. Psychiatric: Mood and affect are normal.   ____________________________________________   INITIAL IMPRESSION / ASSESSMENT AND PLAN / ED COURSE  Pertinent labs & imaging results that were available during my care of the patient were reviewed by me and considered in my medical decision making (see chart for details).   Patient presents to the emergency department for likely allergic reaction unknown antigen.  Patient overall appears well but does have swelling around his eyes mouth with mild erythema of the skin.  No obvious hives.  We will dose Benadryl, Pepcid, Solu-Medrol and IV fluids.  We will continue to closely monitor.  Patient agreeable plan of care.  Patient states he is feeling much better.  Swelling is greatly decreased.  We will discharge with a 5-day course of prednisone.  Patient agreeable plan of care.  Brandon Blake was evaluated in Emergency Department on 08/24/2020 for the symptoms described in the history of present illness. He was evaluated in the context of the global COVID-19 pandemic, which necessitated consideration that the patient might be at risk for infection with the SARS-CoV-2 virus that causes COVID-19. Institutional protocols and algorithms that pertain to the evaluation of patients at risk for COVID-19 are in a state of rapid change based on information released by regulatory bodies including the CDC and federal and state organizations. These  policies and algorithms were followed during the patient's care in the ED.  ____________________________________________   FINAL CLINICAL IMPRESSION(S) / ED DIAGNOSES  Allergic reaction   Minna Antis, MD 08/24/20 [phone]

## 2020-08-24 NOTE — ED Notes (Signed)
Pt reports improvement in symptoms, reports he feels safe for discharge home.

## 2020-08-24 NOTE — ED Notes (Signed)
Swelling decreased in lips, pt reports slight improvements in symptoms.

## 2020-08-24 NOTE — ED Notes (Signed)
Pt continues to report improvement in symptoms

## 2020-09-19 ENCOUNTER — Other Ambulatory Visit: Payer: Self-pay

## 2020-09-19 ENCOUNTER — Encounter (HOSPITAL_COMMUNITY): Payer: Self-pay | Admitting: Physician Assistant

## 2020-09-19 ENCOUNTER — Ambulatory Visit (INDEPENDENT_AMBULATORY_CARE_PROVIDER_SITE_OTHER): Payer: No Payment, Other | Admitting: Physician Assistant

## 2020-09-19 DIAGNOSIS — F331 Major depressive disorder, recurrent, moderate: Secondary | ICD-10-CM

## 2020-09-19 MED ORDER — FLUOXETINE HCL 20 MG PO CAPS
20.0000 mg | ORAL_CAPSULE | Freq: Every day | ORAL | 1 refills | Status: AC
  Filled 2020-09-19: qty 30, 30d supply, fill #0

## 2020-09-19 MED ORDER — QUETIAPINE FUMARATE 50 MG PO TABS
ORAL_TABLET | Freq: Every day | ORAL | 1 refills | Status: AC
  Filled 2020-09-19: qty 30, 30d supply, fill #0

## 2020-09-20 ENCOUNTER — Other Ambulatory Visit: Payer: Self-pay

## 2020-09-22 ENCOUNTER — Encounter (HOSPITAL_COMMUNITY): Payer: Self-pay | Admitting: Physician Assistant

## 2020-09-22 NOTE — Progress Notes (Signed)
[facility] MD/PA/NP OP Progress Note  09/22/2020 2:52 PM Brandon Blake  MRN:  [id number]  Chief Complaint:  Chief Complaint   Medication Management    HPI:   Brandon Blake is a 40 year old male with a past psychiatric history significant for major depressive disorder who [facility] and medication management.  Patient is currently being managed on the following medications:  Fluoxetine 10 mg daily Seroquel 50 mg at bedtime  Patient reports that his Seroquel has been helpful in the management of his sleep disturbances.  He further explains that he was placed on Seroquel for the management of his sleep while he was placed on fluoxetine for the management of his agitation by his previous provider.  Patient still endorses feelings of agitation but is unable to attribute any discernible cause.  Patient denies any new stressors at this time.  Patient further denies anxiety.  Patient states that his anxiety/aggression is triggered by any number of things.  Patient states that he was recently fired from his job due to his aggression.  A PHQ-9 screen was performed with the patient scoring an 11.  A GAD-7 screen was also performed with the patient scoring a 12.  Patient is pleasant, calm, cooperative, and fully engaged in conversation during the encounter.  Patient reports that he is doing all right, however, he does feel tired.  Patient denies suicidal or homicidal ideations.  He further denies auditory or visual hallucinations and does not appear to be responding to internal/external stimuli.  Patient endorses good sleep and receives on average 6 hours of sleep each night.  Patient endorses fair appetite and eats on average 1 meal a day.  Patient denies alcohol consumption and illicit drug use.  Patient endorses tobacco use and smokes on average a pack per day.  Visit Diagnosis:    ICD-10-CM   1. MDD (major depressive disorder),  recurrent episode, moderate (HCC)  F33.1 QUEtiapine (SEROQUEL) 50 MG tablet    FLUoxetine (PROZAC) 20 MG capsule      Past Psychiatric History:  Major depressive disorder  Past Medical History:  Past Medical History:  Diagnosis Date   Back pain, chronic    Seizures (HCC)     Past Surgical History:  Procedure Laterality Date   CHOLECYSTECTOMY      Family Psychiatric History:  Denied  Family History:  Family History  Problem Relation Age of Onset   Cancer Father    Diabetes Other    Coronary artery disease Other     Social History:  Social History   Socioeconomic History   Marital status: Married    Spouse name: Not on file   Number of children: Not on file   Years of education: Not on file   Highest education level: Not on file  Occupational History   Not on file  Tobacco Use   Smoking status: Every Day    Packs/day: 0.50    Pack years: 0.00    Types: Cigarettes   Smokeless tobacco: Never  Substance and Sexual Activity   Alcohol use: Yes    Comment: social   Drug use: No   Sexual activity: Not on file  Other Topics Concern   Not on file  Social History Narrative   Not on file   Social Determinants of Health   Financial Resource Strain: Not on file  Food Insecurity: Not on file  Transportation Needs: Not on file  Physical Activity: Not on file  Stress: Not on file  Social Connections: Not on file    Allergies:  Allergies  Allergen Reactions   Naproxen Hives, Swelling and Rash    Swelling of hands     Metabolic Disorder Labs: No results found for: HGBA1C, MPG No results found for: PROLACTIN No results found for: CHOL, TRIG, HDL, CHOLHDL, VLDL, LDLCALC No results found for: TSH  Therapeutic Level Labs: No results found for: LITHIUM No results found for: VALPROATE No components found for:  CBMZ  Current Medications: Current Outpatient Medications  Medication Sig Dispense Refill   albuterol (PROVENTIL HFA;VENTOLIN HFA) 108 (90 Base)  MCG/ACT inhaler Inhale 2 puffs into the lungs every 4 (four) hours as needed for wheezing or shortness of breath. 1 Inhaler 0   Aspirin-Acetaminophen (GOODYS BODY PAIN PO) Take 2 packets by mouth every 4 (four) hours as needed (for pain).      cetirizine (ZYRTEC ALLERGY) 10 MG tablet Take 1 tablet (10 mg total) by mouth daily. 30 tablet 1   cyclobenzaprine (FLEXERIL) 5 MG tablet Take 1 tablet (5 mg total) by mouth 3 (three) times daily as needed. 15 tablet 0   FLUoxetine (PROZAC) 20 MG capsule Take 1 capsule (20 mg total) by mouth daily. 30 capsule 1   QUEtiapine (SEROQUEL) 50 MG tablet TAKE 1 TABLET (50 MG TOTAL) BY MOUTH AT BEDTIME. 30 tablet 1   No current facility-administered medications for this visit.     Musculoskeletal: Strength & Muscle Tone: within normal limits Gait & Station: normal Patient leans: N/A  Psychiatric Specialty Exam: Review of Systems  Psychiatric/Behavioral:  Positive for agitation. Negative for decreased concentration, dysphoric mood, hallucinations, self-injury, sleep disturbance and suicidal ideas. The patient is nervous/anxious. The patient is not hyperactive.    Blood pressure 117/76, pulse 74, height 6' (1.829 m), weight 266 lb (120.7 kg).Body mass index is 36.08 kg/m.  General Appearance: Fairly Groomed  Eye Contact:  Good  Speech:  Clear and Coherent and Normal Rate  Volume:  Normal  Mood:  Anxious, Depressed, and Irritable  Affect:  Congruent and Depressed  Thought Process:  Coherent, Goal Directed, and Descriptions of Associations: Intact  Orientation:  Full (Time, Place, and Person)  Thought Content: WDL   Suicidal Thoughts:  No  Homicidal Thoughts:  No  Memory:  Immediate;   Good Recent;   Good Remote;   Good  Judgement:  Fair  Insight:  Fair  Psychomotor Activity:  Normal  Concentration:  Concentration: Good and Attention Span: Good  Recall:  Good  Fund of Knowledge: Good  Language: Good  Akathisia:  NA  Handed:  Right  AIMS (if  indicated): not done  Assets:  Communication Skills Desire for Improvement Financial Resources/Insurance Housing Transportation Vocational/Educational  ADL's:  Intact  Cognition: WNL  Sleep:  Good   Screenings: GAD-7    Flowsheet Row Office Visit from 09/19/2020 in [facility] Counselor from 02/23/2020 in [facility]  Total GAD-7 Score 12 18      PHQ2-9    Flowsheet Row Office Visit from 09/19/2020 in [facility] Office Visit from 05/07/2020 in [facility] Counselor from 02/23/2020 in [facility]  PHQ-2 Total Score 4 4 6   PHQ-9 Total Score 11 13 11       Flowsheet Row Office Visit from 09/19/2020 in [facility] ED from 08/24/2020 in [facility] Office Visit from 05/07/2020 in [facility]  C-SSRS RISK CATEGORY Low Risk No Risk No Risk        Assessment and Plan:   Brandon Blake is a 40 year old male with a past psychiatric history significant for major depressive disorder who presents to [facility] for follow-up and medication management.  Patient denies any issues or concerns regarding his current medication regimen.  Patient states that his Seroquel has been helpful with the management of his sleep.  Patient still endorses episodes of agitation and aggression with no discernible cause.  Patient was recommended increasing his fluoxetine dosage from 10 mg to 20 mg daily.  Patient was agreeable to recommendation.  Patient was informed that the Seroquel also has some efficacy in managing agitation.  Patient was instructed to go up on this dose to see if his agitation would be better managed.  Patient was agreeable to plan.  Patient is requesting refills following conclusion of the encounter.  Patient's  medications to be e-prescribed to pharmacy of choice.  1. MDD (major depressive disorder), recurrent episode, moderate (HCC)  - QUEtiapine (SEROQUEL) 50 MG tablet; TAKE 1 TABLET (50 MG TOTAL) BY MOUTH AT BEDTIME.  Dispense: 30 tablet; Refill: 1 - FLUoxetine (PROZAC) 20 MG capsule; Take 1 capsule (20 mg total) by mouth daily.  Dispense: 30 capsule; Refill: 1  Patient to follow up in 6 weeks Provider spent a total of 20 minutes with the patient/reviewing patient's chart  Meta Hatchet, PA 09/22/2020, 2:52 PM

## 2020-09-27 ENCOUNTER — Other Ambulatory Visit: Payer: Self-pay

## 2020-10-17 ENCOUNTER — Ambulatory Visit (HOSPITAL_COMMUNITY): Payer: No Payment, Other | Admitting: Clinical

## 2020-11-06 ENCOUNTER — Encounter (HOSPITAL_COMMUNITY): Payer: No Payment, Other | Admitting: Physician Assistant

## 2020-11-07 ENCOUNTER — Ambulatory Visit (HOSPITAL_COMMUNITY): Payer: No Payment, Other | Admitting: Clinical

## 2020-12-03 ENCOUNTER — Ambulatory Visit (HOSPITAL_COMMUNITY): Payer: No Payment, Other | Admitting: Clinical

## 2020-12-31 ENCOUNTER — Ambulatory Visit (HOSPITAL_COMMUNITY): Payer: No Payment, Other | Admitting: Clinical

## 2021-08-01 ENCOUNTER — Emergency Department
Admission: EM | Admit: 2021-08-01 | Discharge: 2021-08-01 | Disposition: A | Discharge status: Home / Self Care | Payer: BC Managed Care – PPO | Attending: Emergency Medicine | Admitting: Emergency Medicine

## 2021-08-01 ENCOUNTER — Emergency Department: Payer: BC Managed Care – PPO

## 2021-08-01 ENCOUNTER — Other Ambulatory Visit: Payer: Self-pay

## 2021-08-01 DIAGNOSIS — S39012A Strain of muscle, fascia and tendon of lower back, initial encounter: Secondary | ICD-10-CM | POA: Diagnosis not present

## 2021-08-01 DIAGNOSIS — X58XXXA Exposure to other specified factors, initial encounter: Secondary | ICD-10-CM | POA: Diagnosis not present

## 2021-08-01 DIAGNOSIS — S3992XA Unspecified injury of lower back, initial encounter: Secondary | ICD-10-CM | POA: Diagnosis present

## 2021-08-01 LAB — BASIC METABOLIC PANEL
Anion gap: 8 (ref 5–15)
BUN: 17 mg/dL (ref 6–20)
CO2: 28 mmol/L (ref 22–32)
Calcium: 9.2 mg/dL (ref 8.9–10.3)
Chloride: 104 mmol/L (ref 98–111)
Creatinine, Ser: 0.87 mg/dL (ref 0.61–1.24)
GFR, Estimated: >60 mL/min (ref >60)
Glucose, Bld: 95 mg/dL (ref 70–99)
Potassium: 3.5 mmol/L (ref 3.5–5.1)
Sodium: 140 mmol/L (ref 135–145)

## 2021-08-01 LAB — CBC
HCT: 42.5 % (ref 39.0–52.0)
Hemoglobin: 14.4 g/dL (ref 13.0–17.0)
MCH: 31.1 pg (ref 26.0–34.0)
MCHC: 33.9 g/dL (ref 30.0–36.0)
MCV: 91.8 fL (ref 80.0–100.0)
Platelets: 225 10*3/uL (ref 150–400)
RBC: 4.63 MIL/uL (ref 4.22–5.81)
RDW: 12.4 % (ref 11.5–15.5)
WBC: 10.1 10*3/uL (ref 4.0–10.5)
nRBC: 0 % (ref 0.0–0.2)

## 2021-08-01 LAB — URINALYSIS, ROUTINE W REFLEX MICROSCOPIC
Bacteria, UA: NONE SEEN
Bilirubin Urine: NEGATIVE
Glucose, UA: NEGATIVE mg/dL
Ketones, ur: NEGATIVE mg/dL
Leukocytes,Ua: NEGATIVE
Nitrite: NEGATIVE
Protein, ur: NEGATIVE mg/dL
Specific Gravity, Urine: 1.027 (ref 1.005–1.030)
Squamous Epithelial / HPF: NONE SEEN (ref 0–5)
pH: 6 (ref 5.0–8.0)

## 2021-08-01 MED ORDER — METHOCARBAMOL 500 MG PO TABS: 500.0000 mg | ORAL_TABLET | Freq: Four times a day (QID) | ORAL | 0 refills | Status: DC

## 2021-08-01 MED ORDER — KETOROLAC TROMETHAMINE 30 MG/ML IJ SOLN
30.0000 mg | Freq: Once | INTRAMUSCULAR | Status: AC
  Administered 2021-08-01: 30 mg via INTRAMUSCULAR
  Filled 2021-08-01: qty 1

## 2021-08-01 MED ORDER — MELOXICAM 15 MG PO TABS: 15.0000 mg | ORAL_TABLET | Freq: Every day | ORAL | 0 refills | Status: DC

## 2021-08-01 NOTE — ED Provider Notes (Signed)
? ?[facility] ?Provider Note ? ?Patient Contact: 4:06 PM (approximate) ? ? ?History  ? ?Flank Pain ? ? ?HPI ? ?Brandon Blake is a 41 y.o. male who presents to the emergency department complaining of right-sided lower back/flank pain.  Symptoms have been ongoing times a week.  Patient states that he had no direct injury to his lower back.  No radiation into the leg.  He denies any abdominal symptoms to include nausea, vomiting, diarrhea.  No hematuria, dysuria, polyuria.  No history of kidney stones.  Pain is described as sharp in nature. ?  ? ? ?Physical Exam  ? ?Triage Vital Signs: ?ED Triage Vitals  ?Enc Vitals Group  ?   BP 08/01/21 1440 109/68  ?   Pulse Rate 08/01/21 1440 69  ?   Resp 08/01/21 1440 18  ?   Temp 08/01/21 1440 98.3 ?F (36.8 ?C)  ?   Temp src --   ?   SpO2 08/01/21 1440 99 %  ?   Weight 08/01/21 1441 245 lb (111.1 kg)  ?   Height 08/01/21 1441 6' (1.829 m)  ?   Head Circumference --   ?   Peak Flow --   ?   Pain Score 08/01/21 1441 6  ?   Pain Loc --   ?   Pain Edu? --   ?   Excl. in [place]? --   ? ? ?Most recent vital signs: ?Vitals:  ? 08/01/21 1440 08/01/21 1635  ?BP: 109/68 96/63  ?Pulse: 69 63  ?Resp: 18 16  ?Temp: 98.3 ?F (36.8 ?C)   ?SpO2: 99% 99%  ? ? ? ?General: Alert and in no acute distress.  ?Cardiovascular:  Good peripheral perfusion ?Respiratory: Normal respiratory effort without tachypnea or retractions. Lungs CTAB. Good air entry to the bases with no decreased or absent breath sounds. ?Gastrointestinal: No external abdominal wall findings.  Bowel sounds ?4 quadrants. Soft and nontender to palpation. No guarding or rigidity. No palpable masses. No distention. No CVA tenderness. ?Musculoskeletal: Full range of motion to all extremities.  Palpation along the lumbar spine reveals no midline or left-sided tenderness.  Patient has tenderness in the right paraspinal muscle group that reproduces patient's symptoms.  No extension into the SI joint or sciatic notch.   Dorsalis pedis pulses sensation intact ankle bilateral lower extremities. ?Neurologic:  No gross focal neurologic deficits are appreciated.  ?Skin:   No rash noted ?Other: ? ? ?ED Results / Procedures / Treatments  ? ?Labs ?(all labs ordered are listed, but only abnormal results are displayed) ?Labs Reviewed  ?URINALYSIS, ROUTINE W REFLEX MICROSCOPIC - Abnormal; Notable for the following components:  ?    Result Value  ? Color, Urine YELLOW (*)   ? APPearance CLEAR (*)   ? Hgb urine dipstick SMALL (*)   ? All other components within normal limits  ?CBC  ?BASIC METABOLIC PANEL  ? ? ? ?EKG ? ? ? ? ?RADIOLOGY ? ?I personally viewed and evaluated these images as part of my medical decision making, as well as reviewing the written report by the radiologist. ? ?ED Provider Interpretation: No evidence of nephrolithiasis on CT scan.  Diverticulosis without diverticulitis.  No other significant findings on CT. ? ?CT Renal Stone Study ? ?Result Date: 08/01/2021 ?CLINICAL DATA:  Right flank pain EXAM: CT ABDOMEN AND PELVIS WITHOUT CONTRAST TECHNIQUE: Multidetector CT imaging of the abdomen and pelvis was performed following the standard protocol without IV contrast. RADIATION DOSE REDUCTION: This  exam was performed according to the departmental dose-optimization program which includes automated exposure control, adjustment of the mA and/or kV according to patient size and/or use of iterative reconstruction technique. COMPARISON:  None. FINDINGS: Lower chest: No acute abnormality. Hepatobiliary: No focal liver abnormality is seen. Status post cholecystectomy. No biliary dilatation. Pancreas: Unremarkable. No pancreatic ductal dilatation or surrounding inflammatory changes. Spleen: Normal in size without focal abnormality. Adrenals/Urinary Tract: Adrenal glands are unremarkable. Kidneys are normal, without renal calculi, focal lesion, or hydronephrosis. Bladder is unremarkable. Stomach/Bowel: No bowel obstruction, free air or  pneumatosis. Mild colonic diverticulosis. No bowel wall edema. Appendix is normal. Vascular/Lymphatic: No significant vascular findings are present. No enlarged abdominal or pelvic lymph nodes. Reproductive: Prostate is unremarkable. Other: No abdominal wall hernia or abnormality. No abdominopelvic ascites. Musculoskeletal: No acute or significant osseous findings. IMPRESSION: 1. No nephrolithiasis or acute process identified. 2. Mild colonic diverticulosis. Electronically Signed   By: Ofilia Neas M.D.   On: 08/01/2021 16:36   ? ?PROCEDURES: ? ?Critical Care performed: No ? ?Procedures ? ? ?MEDICATIONS ORDERED IN ED: ?Medications  ?ketorolac (TORADOL) 30 MG/ML injection 30 mg (has no administration in time range)  ? ? ? ?IMPRESSION / MDM / ASSESSMENT AND PLAN / ED COURSE  ?I reviewed the triage vital signs and the nursing notes. ?             ?               ? ?Differential diagnosis includes, but is not limited to, low back pain, herniated disc, nephrolithiasis, pyelonephritis ? ? ?Patient's diagnosis is consistent with lumbar strain.  Patient presented to the emergency department complaining of right lower back pain times a week.  No direct trauma.  No radicular symptoms.  Patient denies any urinary symptoms but did have some mild hemoglobin in his urine.  Given the location of pain with findings and urinalysis CT scan was obtained to ensure no evidence of nephrolithiasis.  CT is reassuring at this time with no nephrolithiasis, pyelonephritis, obstructive uropathy.  Given the tenderness in the right paraspinal muscle group I will treat with NSAIDs and muscle relaxers for the patient.  Concerning signs and symptoms are discussed with the patient.  Follow-up primary care as needed..  Patient is given ED precautions to return to the ED for any worsening or new symptoms. ? ? ? ?  ? ? ?FINAL CLINICAL IMPRESSION(S) / ED DIAGNOSES  ? ?Final diagnoses:  ?Lumbar strain, initial encounter  ? ? ? ?Rx / DC Orders  ? ?ED  Discharge Orders   ? ?      Ordered  ?  meloxicam (MOBIC) 15 MG tablet  Daily       ? 08/01/21 1653  ?  methocarbamol (ROBAXIN) 500 MG tablet  4 times daily       ? 08/01/21 1653  ? ?  ?  ? ?  ? ? ? ?Note:  This document was prepared using Dragon voice recognition software and may include unintentional dictation errors. ?  ?Darletta Moll, PA-C ?08/01/21 1654 ? ?  ?Duffy Bruce, MD ?08/02/21 1528 ? ?

## 2021-08-01 NOTE — ED Triage Notes (Signed)
Pt arrives with c/o right sided flank pain that started a week ago. Pt denies n/v/d or urinary symptoms.  ?

## 2021-12-22 ENCOUNTER — Emergency Department
Admission: EM | Admit: 2021-12-22 | Discharge: 2021-12-22 | Disposition: A | Discharge status: Home / Self Care | Payer: BC Managed Care – PPO | Attending: Emergency Medicine | Admitting: Emergency Medicine

## 2021-12-22 ENCOUNTER — Other Ambulatory Visit: Payer: Self-pay

## 2021-12-22 DIAGNOSIS — S60862A Insect bite (nonvenomous) of left wrist, initial encounter: Secondary | ICD-10-CM | POA: Insufficient documentation

## 2021-12-22 DIAGNOSIS — W57XXXA Bitten or stung by nonvenomous insect and other nonvenomous arthropods, initial encounter: Secondary | ICD-10-CM | POA: Insufficient documentation

## 2021-12-22 MED ORDER — CEPHALEXIN 500 MG PO CAPS: 500.0000 mg | ORAL_CAPSULE | Freq: Three times a day (TID) | ORAL | 0 refills | Status: AC

## 2021-12-22 NOTE — ED Triage Notes (Signed)
Pt comes with c/o spider bite to left forearm. Pt has redness and swelling noted to arm.

## 2021-12-22 NOTE — ED Notes (Signed)
See triage note  Presents with possible insect to right forearm  Area is red and slightly swollen

## 2021-12-22 NOTE — ED Provider Notes (Signed)
[facility] Provider Note    Event Date/Time   First MD Initiated Contact with Patient 12/22/21 [phone]     (approximate)   History   Insect Bite   HPI  Brandon Blake is a 41 y.o. male to the ED with length of insect bite to his left wrist area that occurred over the weekend.  He states this morning when he got to work is become more red and tender.  He did not see an actual insect but was out fishing and in the woods over the weekend.  Has history of chronic back pain, aspiration pneumonia, PTSD, major depressive disorder.     Physical Exam   Triage Vital Signs: ED Triage Vitals  Enc Vitals Group     BP 12/22/21 0912 113/79     Pulse Rate 12/22/21 0912 67     Resp 12/22/21 0912 18     Temp 12/22/21 0912 98.3 F (36.8 C)     Temp Source 12/22/21 0912 Oral     SpO2 12/22/21 0912 97 %     Weight 12/22/21 0912 270 lb (122.5 kg)     Height 12/22/21 0912 6' (1.829 m)     Head Circumference --      Peak Flow --      Pain Score 12/22/21 0902 5     Pain Loc --      Pain Edu? --      Excl. in [place]? --     Most recent vital signs: Vitals:   12/22/21 0912  BP: 113/79  Pulse: 67  Resp: 18  Temp: 98.3 F (36.8 C)  SpO2: 97%     General: Awake, no distress.  CV:  Good peripheral perfusion.  Resp:  Normal effort.  Abd:  No distention.  Other:  Left wrist lateral aspect erythema and warmth noted.  There is a central area with a small pustule.  No active drainage at this time.  Patient is able to flex and extend his wrist without any difficulty.   ED Results / Procedures / Treatments   Labs (all labs ordered are listed, but only abnormal results are displayed) Labs Reviewed - No data to display     PROCEDURES:  Critical Care performed:   Procedures   MEDICATIONS ORDERED IN ED: Medications - No data to display   IMPRESSION / MDM / ASSESSMENT AND PLAN / ED COURSE  I reviewed the triage vital signs and the nursing  notes.   Differential diagnosis includes, but is not limited to, insect bite/infection, cellulitis, allergic reaction.    41 year old male presents to the ED with insect bite to his wrist that occurred over the weekend while he was fishing.  Area is more swollen and red today.  On exam it is consistent with a infected insect bite.  Patient is encouraged to use warm moist compresses and not open this area up on his self.  A prescription for antibiotics was sent to the pharmacy and patient was made aware that he can take Tylenol or ibuprofen as needed for pain.  He will follow-up with his PCP if any continued problems or concerns.    Patient's presentation is most consistent with acute, uncomplicated illness.  FINAL CLINICAL IMPRESSION(S) / ED DIAGNOSES   Final diagnoses:  Bug bite with infection, initial encounter     Rx / DC Orders   ED Discharge Orders          Ordered    cephALEXin (KEFLEX)  500 MG capsule  3 times daily        12/22/21 1001             Note:  This document was prepared using Dragon voice recognition software and may include unintentional dictation errors.   Johnn Hai, PA-C 12/22/21 1300    Harvest Dark, MD 12/22/21 [phone]

## 2021-12-22 NOTE — Discharge Instructions (Addendum)
Follow-up with your primary care provider if any continued problems or concerns.  Use warm moist compresses to the area frequently today.  This area should open up on its own.  Take antibiotics until completely finished.  You may also take Tylenol as needed for pain.  If any severe worsening of this area, fever, chills, nausea or vomiting return to the emergency department for reevaluation.

## 2023-02-15 ENCOUNTER — Emergency Department
Admission: EM | Admit: 2023-02-15 | Discharge: 2023-02-15 | Disposition: A | Discharge status: Home / Self Care | Payer: BC Managed Care – PPO | Attending: Emergency Medicine | Admitting: Emergency Medicine

## 2023-02-15 ENCOUNTER — Emergency Department: Payer: BC Managed Care – PPO

## 2023-02-15 ENCOUNTER — Other Ambulatory Visit: Payer: Self-pay

## 2023-02-15 ENCOUNTER — Encounter: Payer: Self-pay | Admitting: Emergency Medicine

## 2023-02-15 DIAGNOSIS — X58XXXA Exposure to other specified factors, initial encounter: Secondary | ICD-10-CM | POA: Insufficient documentation

## 2023-02-15 DIAGNOSIS — S39012A Strain of muscle, fascia and tendon of lower back, initial encounter: Secondary | ICD-10-CM | POA: Diagnosis not present

## 2023-02-15 DIAGNOSIS — M545 Low back pain, unspecified: Secondary | ICD-10-CM | POA: Diagnosis present

## 2023-02-15 MED ORDER — LIDOCAINE 5 % EX PTCH
1.0000 | MEDICATED_PATCH | CUTANEOUS | Status: DC
  Administered 2023-02-15: 1 via TRANSDERMAL
  Filled 2023-02-15: qty 1

## 2023-02-15 MED ORDER — MORPHINE SULFATE (PF) 4 MG/ML IV SOLN
4.0000 mg | Freq: Once | INTRAVENOUS | Status: AC
  Administered 2023-02-15: 4 mg via INTRAMUSCULAR
  Filled 2023-02-15: qty 1

## 2023-02-15 MED ORDER — CYCLOBENZAPRINE HCL 5 MG PO TABS: 5.0000 mg | ORAL_TABLET | Freq: Three times a day (TID) | ORAL | 0 refills | Status: AC | PRN

## 2023-02-15 MED ORDER — LIDOCAINE 5 % EX PTCH: 1.0000 | MEDICATED_PATCH | Freq: Two times a day (BID) | CUTANEOUS | 0 refills | Status: AC

## 2023-02-15 MED ORDER — DEXAMETHASONE SODIUM PHOSPHATE 10 MG/ML IJ SOLN
10.0000 mg | Freq: Once | INTRAMUSCULAR | Status: AC
Start: 2023-02-15 — End: 2023-02-15
  Administered 2023-02-15: 10 mg via INTRAMUSCULAR
  Filled 2023-02-15: qty 1

## 2023-02-15 NOTE — Discharge Instructions (Signed)
Your CT scan does not reveal any acute findings.  Imaging the medication as prescribed to help with your symptoms.  However, remember that the Flexeril make you very sleepy and you should not drive, operate heavy machinery, or perform any test that require concentration while taking this medication.  Please follow-up with your outpatient provider.  Please return for any new, worsening, or change in symptoms or other concerns.

## 2023-02-15 NOTE — ED Triage Notes (Signed)
Pt was at work Friday and did something to his lower back.  He thought would get better but has been getting worse.  Pain to right lower back and down right leg.  Ambulatory to triage with discomfort but gait steady and equal.  No loss bowel or bladder.  Did initially have tingling but that did resolve and now just pain.

## 2023-02-15 NOTE — ED Notes (Signed)
See triage notes. Patient c/o lower back pain since Friday. 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

## 2023-02-15 NOTE — ED Provider Notes (Signed)
[facility] Provider Note    Event Date/Time   First MD Initiated Contact with Patient 02/15/23 [phone]     (approximate)   History   Back Pain   HPI  Brandon Blake is a 42 y.o. male who presents today for evaluation of right sided low back pain.  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  He reports that the pain radiates down the lateral aspect of his right leg to just above his knee.  He denies weakness in his leg.  He has not had any urinary or fecal incontinence or retention.  No saddle anesthesia.  Denies history of IV drug use, no fevers or constitutional symptoms.  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          Physical Exam   Triage Vital Signs: ED Triage Vitals  Encounter Vitals Group     BP 02/15/23 0840 (!) 128/98     Systolic BP Percentile --      Diastolic BP Percentile --      Pulse Rate 02/15/23 0840 90     Resp 02/15/23 0840 18     Temp 02/15/23 0840 98.3 F (36.8 C)     Temp Source 02/15/23 0840 Oral     SpO2 02/15/23 0840 98 %     Weight 02/15/23 0838 230 lb (104.3 kg)     Height 02/15/23 0838 6' (1.829 m)     Head Circumference --      Peak Flow --      Pain Score 02/15/23 0837 8     Pain Loc --      Pain Education --      Exclude from Growth Chart --     Most recent vital signs: Vitals:   02/15/23 0840 02/15/23 1157  BP: (!)  128/98 117/82  Pulse: 90 65  Resp: 18 18  Temp: 98.3 F (36.8 C) 97.9 F (36.6 C)  SpO2: 98% 96%    Physical Exam Vitals and nursing note reviewed.  Constitutional:      General: Awake and alert. No acute distress.    Appearance: Normal appearance. The patient is obese.  HENT:     Head: Normocephalic and atraumatic.     Mouth: Mucous membranes are moist.  Eyes:     General: PERRL. Normal EOMs        Right eye: No discharge.        Left eye: No discharge.     Conjunctiva/sclera: Conjunctivae normal.  Cardiovascular:     Rate and Rhythm: Normal rate and regular rhythm.     Pulses: Normal pulses.  Pulmonary:     Effort: Pulmonary effort is normal. No respiratory distress.     Breath sounds: Normal breath sounds.  Abdominal:     Abdomen is soft. There is no abdominal tenderness. No rebound or guarding. No distention. Musculoskeletal:        General:  No swelling. Normal range of motion.     Cervical back: Normal range of motion and neck supple.  Back: No midline tenderness.  Right lumbar paraspinal muscle tenderness present.  Strength and sensation 5/5 to bilateral lower extremities. Normal great toe extension against resistance. Normal sensation throughout feet. Normal patellar reflexes. Negative SLR and opposite SLR bilaterally. Negative FABER test Skin:    General: Skin is warm and dry.     Capillary Refill: Capillary refill takes less than 2 seconds.     Findings: No rash.  Neurological:     Mental Status: The patient is awake and alert.      ED Results / Procedures / Treatments   Labs (all labs ordered are listed, but only abnormal results are displayed) Labs Reviewed - No data to display   EKG     RADIOLOGY I independently reviewed and interpreted imaging and agree with radiologists findings.     PROCEDURES:  Critical Care performed:   Procedures   MEDICATIONS ORDERED IN ED: Medications  lidocaine (LIDODERM) 5 % 1 patch (1 patch Transdermal Patch  Applied 02/15/23 0942)  dexamethasone (DECADRON) injection 10 mg (10 mg Intramuscular Given 02/15/23 0943)  morphine (PF) 4 MG/ML injection 4 mg (4 mg Intramuscular Given 02/15/23 0945)     IMPRESSION / MDM / ASSESSMENT AND PLAN / ED COURSE  I reviewed the triage vital signs and the nursing notes.   Differential diagnosis includes, but is not limited to, lumbar radiculopathy, herniated disc, muscle strain, muscle spasm.  Patient is awake and alert, hemodynamically stable and afebrile.  He is able to ambulate.  He has no neurological deficits.  He has 5 out of 5 strength with intact sensation to extensor hallucis dorsiflexion and plantarflexion of bilateral lower extremities with normal patellar reflexes bilaterally. Most likely etiology at this point is muscle strain vs herniated disc. No red flags to indicate patient is at risk for more auspicious process that would require urgent/emergent subspecialty evaluation at this time. No major trauma, no midline tenderness, no history or physical exam findings to suggest cauda equina syndrome or spinal cord compression. No focal neurological deficits on exam. No constitutional symptoms or history of immunosuppression or IVDA to suggest potential for epidural abscess. Not anticoagulated, no history of bleeding diastasis to suggest risk for epidural hematoma. No chronic steroid use or advanced age or history of malignancy to suggest proclivity towards pathological fracture.  No abdominal pain or flank pain to suggest kidney stone, no history of kidney stone.  No fever or dysuria or CVAT to suggest pyelonephritis.  No chest pain, back pain, shortness of breath, neurological deficits, to suggest vascular catastrophe, and pulses are equal in all 4 extremities.   CT lumbar spine reveals degenerative changes though no fractures or acute findings.  Upon reevaluation, patient reports improvement of his symptoms.  Discussed care instructions and return precautions  with patient. Recommended close outpatient follow-up for re-evaluation. Patient agrees with plan of care. Will treat the patient symptomatically as needed for pain control. Will discharge patient to take symptomatic relief and return for any worsening or different pain or development of any neurologic symptoms.  He was given Flexeril given his allergy to NSAIDs and was advised that this medication will make him sleepy so he cannot drive, operate heavy machinery, or performing test that require concentration while taking this medication.  He was also given a work note per her request.  Educated patient regarding expected time course for back pain to improve and  recommended very close outpatient follow-up.  Patient understands and agrees with plan.  He was discharged in stable condition.   Patient's presentation is most consistent with acute complicated illness / injury requiring diagnostic workup.    FINAL CLINICAL IMPRESSION(S) / ED DIAGNOSES   Final diagnoses:  Strain of lumbar region, initial encounter     Rx / DC Orders   ED Discharge Orders          Ordered    cyclobenzaprine (FLEXERIL) 5 MG tablet  3 times daily PRN        02/15/23 1350    lidocaine (LIDODERM) 5 %  Every 12 hours        02/15/23 1350             Note:  This document was prepared using Dragon voice recognition software and may include unintentional dictation errors.   Jackelyn Hoehn, PA-C 02/15/23 1424    Chesley Noon, MD 02/15/23 [phone]

## 2023-07-25 ENCOUNTER — Emergency Department

## 2023-07-25 ENCOUNTER — Emergency Department
Admission: EM | Admit: 2023-07-25 | Discharge: 2023-07-25 | Disposition: A | Discharge status: Home / Self Care | Attending: Emergency Medicine | Admitting: Emergency Medicine

## 2023-07-25 ENCOUNTER — Other Ambulatory Visit: Payer: Self-pay

## 2023-07-25 DIAGNOSIS — Y9339 Activity, other involving climbing, rappelling and jumping off: Secondary | ICD-10-CM | POA: Insufficient documentation

## 2023-07-25 DIAGNOSIS — W1839XA Other fall on same level, initial encounter: Secondary | ICD-10-CM | POA: Insufficient documentation

## 2023-07-25 DIAGNOSIS — D72829 Elevated white blood cell count, unspecified: Secondary | ICD-10-CM | POA: Insufficient documentation

## 2023-07-25 DIAGNOSIS — S299XXA Unspecified injury of thorax, initial encounter: Secondary | ICD-10-CM | POA: Diagnosis present

## 2023-07-25 LAB — CBC
HCT: 42.4 % (ref 39.0–52.0)
Hemoglobin: 14.7 g/dL (ref 13.0–17.0)
MCH: 31.6 pg (ref 26.0–34.0)
MCHC: 34.7 g/dL (ref 30.0–36.0)
MCV: 91.2 fL (ref 80.0–100.0)
Platelets: 262 10*3/uL (ref 150–400)
RBC: 4.65 MIL/uL (ref 4.22–5.81)
RDW: 12 % (ref 11.5–15.5)
WBC: 11.8 10*3/uL — ABNORMAL HIGH (ref 4.0–10.5)
nRBC: 0.2 % (ref 0.0–0.2)

## 2023-07-25 LAB — BASIC METABOLIC PANEL WITH GFR
Anion gap: 11 (ref 5–15)
BUN: 14 mg/dL (ref 6–20)
CO2: 24 mmol/L (ref 22–32)
Calcium: 8.9 mg/dL (ref 8.9–10.3)
Chloride: 104 mmol/L (ref 98–111)
Creatinine, Ser: 0.71 mg/dL (ref 0.61–1.24)
GFR, Estimated: >60 mL/min (ref >60)
Glucose, Bld: 104 mg/dL — ABNORMAL HIGH (ref 70–99)
Potassium: 3.8 mmol/L (ref 3.5–5.1)
Sodium: 139 mmol/L (ref 135–145)

## 2023-07-25 LAB — TROPONIN I (HIGH SENSITIVITY): Troponin I (High Sensitivity): <2 ng/L (ref <18)

## 2023-07-25 LAB — HEPATIC FUNCTION PANEL
ALT: 20 U/L (ref 0–44)
AST: 21 U/L (ref 15–41)
Albumin: 3.6 g/dL (ref 3.5–5.0)
Alkaline Phosphatase: 52 U/L (ref 38–126)
Bilirubin, Direct: <0.1 mg/dL (ref 0.0–0.2)
Total Bilirubin: 0.5 mg/dL (ref 0.0–1.2)
Total Protein: 7.1 g/dL (ref 6.5–8.1)

## 2023-07-25 LAB — LIPASE, BLOOD: Lipase: 44 U/L (ref 11–51)

## 2023-07-25 NOTE — Discharge Instructions (Signed)
 Take acetaminophen  650 mg and ibuprofen  400 mg every 6 hours for pain.  Take with food. Use incentive spirometer throughout the day to aerate the lungs.   Thank you for choosing us  for your health care today!  Please see your primary doctor this week for a follow up appointment.   If you have any new, worsening, or unexpected symptoms call your doctor right away or come back to the emergency department for reevaluation.  It was my pleasure to care for you today.   Arron Large Margery Sheets, MD

## 2023-07-25 NOTE — ED Provider Notes (Signed)
 [facility] Provider Note    Event Date/Time   First MD Initiated Contact with Patient 07/25/23 0254     (approximate)   History   Chest Pain   HPI  Brandon Blake is a 43 y.o. male   Past medical history of chronic back pain presents emerged department with chest wall injury sustained about 2 weeks ago when he was climbing a tree in front of his kids and fell onto his right chest.  Since then he has ongoing soreness to that area.  He reports no respiratory infectious symptoms.  He reports no other injuries sustained during that time.    Physical Exam   Triage Vital Signs: ED Triage Vitals  Encounter Vitals Group     BP 07/25/23 0147 127/82     Systolic BP Percentile --      Diastolic BP Percentile --      Pulse Rate 07/25/23 0147 68     Resp 07/25/23 0147 18     Temp 07/25/23 0150 98.8 F (37.1 C)     Temp Source 07/25/23 0150 Oral     SpO2 07/25/23 0147 97 %     Weight 07/25/23 0150 250 lb (113.4 kg)     Height 07/25/23 0150 6' (1.829 m)     Head Circumference --      Peak Flow --      Pain Score 07/25/23 0149 4     Pain Loc --      Pain Education --      Exclude from Growth Chart --     Most recent vital signs: Vitals:   07/25/23 0300 07/25/23 0330  BP: 117/69 111/81  Pulse: 75 72  Resp: 16 18  Temp:    SpO2: 98% 99%    General: Awake, no distress.  CV:  Good peripheral perfusion.  Resp:  Normal effort.  Abd:  No distention.  Other:  No skin rash to the area no pain with light touch but with deeper palpation there is pain elicited to the right chest wall lateral side.  No contusion.  Lungs clear to auscultation good air movement.   ED Results / Procedures / Treatments   Labs (all labs ordered are listed, but only abnormal results are displayed) Labs Reviewed  BASIC METABOLIC PANEL WITH GFR - Abnormal; Notable for the following components:      Result Value   Glucose, Bld 104 (*)    All other components within  normal limits  CBC - Abnormal; Notable for the following components:   WBC 11.8 (*)    All other components within normal limits  HEPATIC FUNCTION PANEL  LIPASE, BLOOD  TROPONIN I (HIGH SENSITIVITY)     I ordered and reviewed the above labs they are notable for white blood cell count mildly elevated, troponin normal.  EKG  ED ECG REPORT I, Buell Carmin, the attending physician, personally viewed and interpreted this ECG.   Date: 07/25/2023  EKG Time: 0149  Rate: 75  Rhythm: sinus  Axis: nl  Intervals:nl  ST&T Change: no stemi    RADIOLOGY I independently reviewed and interpreted chest x-ray and I see no obvious fracture or pneumothorax I also reviewed radiologist's formal read.   PROCEDURES:  Critical Care performed: No  Procedures   MEDICATIONS ORDERED IN ED: Medications - No data to display   IMPRESSION / MDM / ASSESSMENT AND PLAN / ED COURSE  I reviewed the triage vital signs and the nursing notes.  Patient's presentation is most consistent with acute presentation with potential threat to life or bodily function.  Differential diagnosis includes, but is not limited to, chest wall contusion, rib fracture, pneumothorax, lung contusion, shingles   The patient is on the cardiac monitor to evaluate for evidence of arrhythmia and/or significant heart rate changes.  MDM:    Chest wall injury with no external signs of injury, no pneumothorax or rib fracture on chest x-ray and patient stable.  I doubt ACS and especially in light of normal-appearing EKG and normal initial troponin.  Anticipatory guidance given, NSAID respirometer as well, discharge.  PMD referral as he has none.  He looks well enough for discharge.      FINAL CLINICAL IMPRESSION(S) / ED DIAGNOSES   Final diagnoses:  Chest wall injury, initial encounter     Rx / DC Orders   ED Discharge Orders          Ordered    Ambulatory Referral to Primary Care  (Establish Care)        07/25/23 0344             Note:  This document was prepared using Dragon voice recognition software and may include unintentional dictation errors.    Buell Carmin, MD 07/25/23 [phone]

## 2023-07-25 NOTE — ED Triage Notes (Signed)
 Pt reports R to middle chest pain.  4/10.  Pt endorses mild SOB.  Pt relates this pain to a fall 2 weeks ago and thinks it's a pulled muscle.

## 2023-07-25 NOTE — ED Notes (Signed)
 Pt fell about two feet and feels like he pulled a muscle. Pt wanted to make sure it was nothing more. Pt is resting with call bell within reach and no other needs at this time.

## 2024-02-05 ENCOUNTER — Emergency Department

## 2024-02-05 ENCOUNTER — Other Ambulatory Visit: Payer: Self-pay

## 2024-02-05 ENCOUNTER — Emergency Department
Admission: EM | Admit: 2024-02-05 | Discharge: 2024-02-05 | Disposition: A | Discharge status: Home / Self Care | Attending: Emergency Medicine | Admitting: Emergency Medicine

## 2024-02-05 DIAGNOSIS — M79602 Pain in left arm: Secondary | ICD-10-CM | POA: Insufficient documentation

## 2024-02-05 DIAGNOSIS — R0789 Other chest pain: Secondary | ICD-10-CM | POA: Diagnosis not present

## 2024-02-05 LAB — BASIC METABOLIC PANEL WITH GFR
Anion gap: 11 (ref 5–15)
BUN: 11 mg/dL (ref 6–20)
CO2: 26 mmol/L (ref 22–32)
Calcium: 9.2 mg/dL (ref 8.9–10.3)
Chloride: 102 mmol/L (ref 98–111)
Creatinine, Ser: 0.94 mg/dL (ref 0.61–1.24)
GFR, Estimated: >60 mL/min (ref >60)
Glucose, Bld: 113 mg/dL — ABNORMAL HIGH (ref 70–99)
Potassium: 3.5 mmol/L (ref 3.5–5.1)
Sodium: 139 mmol/L (ref 135–145)

## 2024-02-05 LAB — CBC
HCT: 46.5 % (ref 39.0–52.0)
Hemoglobin: 16.1 g/dL (ref 13.0–17.0)
MCH: 31 pg (ref 26.0–34.0)
MCHC: 34.6 g/dL (ref 30.0–36.0)
MCV: 89.6 fL (ref 80.0–100.0)
Platelets: 261 K/uL (ref 150–400)
RBC: 5.19 MIL/uL (ref 4.22–5.81)
RDW: 12.1 % (ref 11.5–15.5)
WBC: 9.1 K/uL (ref 4.0–10.5)
nRBC: 0 % (ref 0.0–0.2)

## 2024-02-05 LAB — TROPONIN I (HIGH SENSITIVITY): Troponin I (High Sensitivity): <2 ng/L (ref <18)

## 2024-02-05 MED ORDER — LIDOCAINE 5 % EX PTCH
1.0000 | MEDICATED_PATCH | CUTANEOUS | Status: DC
  Administered 2024-02-05: 1 via TRANSDERMAL
  Filled 2024-02-05: qty 1

## 2024-02-05 MED ORDER — LIDOCAINE 5 % EX PTCH
1.0000 | MEDICATED_PATCH | Freq: Two times a day (BID) | CUTANEOUS | 0 refills | Status: AC
  Filled 2024-02-05: qty 10, 5d supply, fill #0

## 2024-02-05 NOTE — ED Triage Notes (Signed)
 Pt to ED via POV from home. Pt reports left triceps started to have pain and numbness and tingling x3 days. Pt reports pain is now radiating up to chest. Pt denies SOB.

## 2024-02-05 NOTE — ED Provider Notes (Signed)
 [facility] Provider Note    Event Date/Time   First MD Initiated Contact with Patient 02/05/24 1821     (approximate)   History   Chief Complaint Arm Pain   HPI  Brandon Blake is a 43 y.o. male with past medical history of seizures, chronic back pain, depression, and PTSD who presents to the ED complaining of arm pain.  Patient reports that he has had 3 days of worsening pain in the area of his left triceps.  Pain has now spread to the left side of his chest and he denies any specific exacerbating or alleviating factors.  He has not had any traumatic injuries to his arm or chest, denies any recent heavy lifting.  He has not had any fevers, cough, or difficulty breathing.     Physical Exam   Triage Vital Signs: ED Triage Vitals [02/05/24 1707]  Encounter Vitals Group     BP (!) 140/83     Girls Systolic BP Percentile      Girls Diastolic BP Percentile      Boys Systolic BP Percentile      Boys Diastolic BP Percentile      Pulse Rate 70     Resp 20     Temp 98.7 F (37.1 C)     Temp Source Oral     SpO2 98 %     Weight      Height      Head Circumference      Peak Flow      Pain Score 4     Pain Loc      Pain Education      Exclude from Growth Chart     Most recent vital signs: Vitals:   02/05/24 1707 02/05/24 1827  BP: (!) 140/83   Pulse: 70   Resp: 20   Temp: 98.7 F (37.1 C)   SpO2: 98% 98%    Constitutional: Alert and oriented. Eyes: Conjunctivae are normal. Head: Atraumatic. Nose: No congestion/rhinnorhea. Mouth/Throat: Mucous membranes are moist.  Cardiovascular: Normal rate, regular rhythm. Grossly normal heart sounds.  2+ radial pulses bilaterally. Respiratory: Normal respiratory effort.  No retractions. Lungs CTAB.  Left chest wall tenderness to palpation noted. Gastrointestinal: Soft and nontender. No distention. Musculoskeletal: No lower extremity tenderness nor edema.  Tenderness to palpation noted to area of  left triceps with no overlying erythema or warmth. Neurologic:  Normal speech and language. No gross focal neurologic deficits are appreciated.    ED Results / Procedures / Treatments   Labs (all labs ordered are listed, but only abnormal results are displayed) Labs Reviewed  BASIC METABOLIC PANEL WITH GFR - Abnormal; Notable for the following components:      Result Value   Glucose, Bld 113 (*)    All other components within normal limits  CBC  TROPONIN I (HIGH SENSITIVITY)     EKG  ED ECG REPORT I, Carlin Palin, the attending physician, personally viewed and interpreted this ECG.   Date: 02/05/2024  EKG Time: 17:09  Rate: 70  Rhythm: normal sinus rhythm  Axis: Normal  Intervals:none  ST&T Change: None  RADIOLOGY Chest x-ray reviewed and interpreted by me with no infiltrate, edema, or effusion.  PROCEDURES:  Critical Care performed: No  Procedures   MEDICATIONS ORDERED IN ED: Medications  lidocaine  (LIDODERM ) 5 % 1 patch (has no administration in time range)     IMPRESSION / MDM / ASSESSMENT AND PLAN / ED COURSE  I  reviewed the triage vital signs and the nursing notes.                              43 y.o. male with past medical history of seizures, chronic back pain, depression, and PTSD who presents to the ED complaining of left arm pain for 3 days that has moved up into his left chest.  Patient's presentation is most consistent with acute presentation with potential threat to life or bodily function.  Differential diagnosis includes, but is not limited to, ACS, PE, pneumonia, pneumothorax, musculoskeletal pain, GERD, anxiety.  Patient nontoxic-appearing and in no acute distress, vital signs are unremarkable.  EKG shows no evidence of arrhythmia or ischemia, symptoms atypical for ACS and troponin within normal limits.  Chest x-ray is unremarkable and additional labs without significant anemia, leukocytosis, electrolyte abnormality, or AKI.  Given pain is  reproducible with palpation of his left chest and his left triceps, suspect musculoskeletal etiology.  No evidence of infectious process and he is neurovascularly intact to all 4 extremities.  Patient appropriate for outpatient management, was counseled to return to the ED for new or worsening symptoms.  Patient agrees with plan.      FINAL CLINICAL IMPRESSION(S) / ED DIAGNOSES   Final diagnoses:  Chest wall pain  Left arm pain     Rx / DC Orders   ED Discharge Orders          Ordered    lidocaine  (LIDODERM ) 5 %  Every 12 hours        02/05/24 1838             Note:  This document was prepared using Dragon voice recognition software and may include unintentional dictation errors.   Willo Dunnings, MD 02/05/24 1840

## 2024-02-06 ENCOUNTER — Other Ambulatory Visit: Payer: Self-pay

## 2024-02-06 ENCOUNTER — Other Ambulatory Visit (HOSPITAL_COMMUNITY): Payer: Self-pay

## 2024-02-07 ENCOUNTER — Other Ambulatory Visit: Payer: Self-pay

## 2024-02-16 ENCOUNTER — Other Ambulatory Visit: Payer: Self-pay
